# Patient Record
Sex: Female | Born: 1980 | Race: White | Hispanic: No | Marital: Married | Smoking: Never smoker
Health system: Southern US, Community
[De-identification: ages and names within clinical notes are randomized; demographics above are authoritative.]

## PROBLEM LIST (undated history)

## (undated) ENCOUNTER — Inpatient Hospital Stay (HOSPITAL_COMMUNITY): Payer: Self-pay

## (undated) DIAGNOSIS — N979 Female infertility, unspecified: Secondary | ICD-10-CM

## (undated) DIAGNOSIS — E282 Polycystic ovarian syndrome: Secondary | ICD-10-CM

## (undated) DIAGNOSIS — J45998 Other asthma: Secondary | ICD-10-CM

## (undated) DIAGNOSIS — N6320 Unspecified lump in the left breast, unspecified quadrant: Secondary | ICD-10-CM

## (undated) DIAGNOSIS — E119 Type 2 diabetes mellitus without complications: Secondary | ICD-10-CM

## (undated) DIAGNOSIS — M2550 Pain in unspecified joint: Secondary | ICD-10-CM

## (undated) DIAGNOSIS — R0602 Shortness of breath: Secondary | ICD-10-CM

## (undated) DIAGNOSIS — M549 Dorsalgia, unspecified: Secondary | ICD-10-CM

## (undated) DIAGNOSIS — K219 Gastro-esophageal reflux disease without esophagitis: Secondary | ICD-10-CM

## (undated) DIAGNOSIS — Z973 Presence of spectacles and contact lenses: Secondary | ICD-10-CM

## (undated) DIAGNOSIS — Q185 Microstomia: Secondary | ICD-10-CM

## (undated) DIAGNOSIS — D649 Anemia, unspecified: Secondary | ICD-10-CM

## (undated) DIAGNOSIS — E669 Obesity, unspecified: Secondary | ICD-10-CM

## (undated) DIAGNOSIS — J302 Other seasonal allergic rhinitis: Secondary | ICD-10-CM

## (undated) DIAGNOSIS — Z98811 Dental restoration status: Secondary | ICD-10-CM

## (undated) DIAGNOSIS — L732 Hidradenitis suppurativa: Secondary | ICD-10-CM

## (undated) DIAGNOSIS — J189 Pneumonia, unspecified organism: Secondary | ICD-10-CM

## (undated) DIAGNOSIS — G709 Myoneural disorder, unspecified: Secondary | ICD-10-CM

## (undated) DIAGNOSIS — F32A Depression, unspecified: Secondary | ICD-10-CM

## (undated) DIAGNOSIS — T8859XA Other complications of anesthesia, initial encounter: Secondary | ICD-10-CM

## (undated) DIAGNOSIS — R002 Palpitations: Secondary | ICD-10-CM

## (undated) DIAGNOSIS — L309 Dermatitis, unspecified: Secondary | ICD-10-CM

## (undated) DIAGNOSIS — T4145XA Adverse effect of unspecified anesthetic, initial encounter: Secondary | ICD-10-CM

## (undated) DIAGNOSIS — R519 Headache, unspecified: Secondary | ICD-10-CM

## (undated) DIAGNOSIS — F419 Anxiety disorder, unspecified: Secondary | ICD-10-CM

## (undated) DIAGNOSIS — I10 Essential (primary) hypertension: Secondary | ICD-10-CM

## (undated) DIAGNOSIS — F329 Major depressive disorder, single episode, unspecified: Secondary | ICD-10-CM

## (undated) DIAGNOSIS — F909 Attention-deficit hyperactivity disorder, unspecified type: Secondary | ICD-10-CM

## (undated) DIAGNOSIS — M1990 Unspecified osteoarthritis, unspecified site: Secondary | ICD-10-CM

## (undated) DIAGNOSIS — R600 Localized edema: Secondary | ICD-10-CM

## (undated) DIAGNOSIS — N939 Abnormal uterine and vaginal bleeding, unspecified: Secondary | ICD-10-CM

## (undated) HISTORY — DX: Localized edema: R60.0

## (undated) HISTORY — DX: Female infertility, unspecified: N97.9

## (undated) HISTORY — PX: ORIF ANKLE FRACTURE: SHX5408

## (undated) HISTORY — PX: BREAST EXCISIONAL BIOPSY: SUR124

## (undated) HISTORY — DX: Abnormal uterine and vaginal bleeding, unspecified: N93.9

## (undated) HISTORY — DX: Essential (primary) hypertension: I10

## (undated) HISTORY — DX: Pain in unspecified joint: M25.50

## (undated) HISTORY — DX: Polycystic ovarian syndrome: E28.2

## (undated) HISTORY — DX: Attention-deficit hyperactivity disorder, unspecified type: F90.9

## (undated) HISTORY — DX: Hidradenitis suppurativa: L73.2

## (undated) HISTORY — PX: HIGH RISK BREAST EXCISION: SHX6773

## (undated) HISTORY — PX: OVARIAN CYST REMOVAL: SHX89

## (undated) HISTORY — DX: Dorsalgia, unspecified: M54.9

## (undated) HISTORY — DX: Other seasonal allergic rhinitis: J30.2

## (undated) HISTORY — DX: Obesity, unspecified: E66.9

## (undated) HISTORY — DX: Dermatitis, unspecified: L30.9

## (undated) HISTORY — DX: Type 2 diabetes mellitus without complications: E11.9

## (undated) HISTORY — DX: Shortness of breath: R06.02

## (undated) HISTORY — DX: Gastro-esophageal reflux disease without esophagitis: K21.9

## (undated) HISTORY — PX: CHOLECYSTECTOMY: SHX55

## (undated) HISTORY — PX: BREAST BIOPSY: SHX20

## (undated) HISTORY — PX: FLEXIBLE SIGMOIDOSCOPY: SHX1649

## (undated) HISTORY — PX: TONSILLECTOMY AND ADENOIDECTOMY: SHX28

---

## 2008-10-20 HISTORY — PX: CHOLECYSTECTOMY: SHX55

## 2014-04-13 DIAGNOSIS — J45909 Unspecified asthma, uncomplicated: Secondary | ICD-10-CM | POA: Insufficient documentation

## 2014-04-13 DIAGNOSIS — O10911 Unspecified pre-existing hypertension complicating pregnancy, first trimester: Secondary | ICD-10-CM

## 2014-04-13 HISTORY — DX: Unspecified pre-existing hypertension complicating pregnancy, first trimester: O10.911

## 2014-04-13 HISTORY — DX: Unspecified asthma, uncomplicated: J45.909

## 2014-05-04 DIAGNOSIS — Z3490 Encounter for supervision of normal pregnancy, unspecified, unspecified trimester: Secondary | ICD-10-CM | POA: Insufficient documentation

## 2014-05-16 DIAGNOSIS — Z8659 Personal history of other mental and behavioral disorders: Secondary | ICD-10-CM | POA: Insufficient documentation

## 2014-07-11 DIAGNOSIS — R7989 Other specified abnormal findings of blood chemistry: Secondary | ICD-10-CM | POA: Insufficient documentation

## 2014-07-11 HISTORY — DX: Other specified abnormal findings of blood chemistry: R79.89

## 2014-07-24 DIAGNOSIS — M2550 Pain in unspecified joint: Secondary | ICD-10-CM | POA: Insufficient documentation

## 2014-07-24 DIAGNOSIS — I10 Essential (primary) hypertension: Secondary | ICD-10-CM

## 2014-07-24 DIAGNOSIS — E119 Type 2 diabetes mellitus without complications: Secondary | ICD-10-CM | POA: Insufficient documentation

## 2014-07-24 DIAGNOSIS — F419 Anxiety disorder, unspecified: Secondary | ICD-10-CM

## 2014-07-24 HISTORY — DX: Essential (primary) hypertension: I10

## 2014-07-24 HISTORY — DX: Type 2 diabetes mellitus without complications: E11.9

## 2014-07-24 HISTORY — DX: Anxiety disorder, unspecified: F41.9

## 2015-05-25 ENCOUNTER — Other Ambulatory Visit: Payer: Self-pay

## 2015-05-25 DIAGNOSIS — E1140 Type 2 diabetes mellitus with diabetic neuropathy, unspecified: Secondary | ICD-10-CM

## 2015-05-25 DIAGNOSIS — K2100 Gastro-esophageal reflux disease with esophagitis, without bleeding: Secondary | ICD-10-CM

## 2015-05-25 DIAGNOSIS — Z01818 Encounter for other preprocedural examination: Secondary | ICD-10-CM

## 2015-05-25 DIAGNOSIS — J4541 Moderate persistent asthma with (acute) exacerbation: Secondary | ICD-10-CM

## 2015-06-22 ENCOUNTER — Ambulatory Visit (HOSPITAL_COMMUNITY)
Admission: RE | Admit: 2015-06-22 | Discharge: 2015-06-22 | Disposition: A | Discharge status: Home / Self Care | Payer: BC Managed Care – PPO | Source: Ambulatory Visit | Attending: Surgery | Admitting: Surgery

## 2015-06-22 ENCOUNTER — Other Ambulatory Visit: Payer: Self-pay

## 2015-06-22 DIAGNOSIS — E119 Type 2 diabetes mellitus without complications: Secondary | ICD-10-CM | POA: Insufficient documentation

## 2015-06-22 DIAGNOSIS — J45909 Unspecified asthma, uncomplicated: Secondary | ICD-10-CM | POA: Diagnosis not present

## 2015-06-22 DIAGNOSIS — K219 Gastro-esophageal reflux disease without esophagitis: Secondary | ICD-10-CM | POA: Insufficient documentation

## 2015-06-22 DIAGNOSIS — K449 Diaphragmatic hernia without obstruction or gangrene: Secondary | ICD-10-CM | POA: Insufficient documentation

## 2015-06-22 DIAGNOSIS — Z6841 Body Mass Index (BMI) 40.0 and over, adult: Secondary | ICD-10-CM | POA: Insufficient documentation

## 2015-06-22 DIAGNOSIS — I10 Essential (primary) hypertension: Secondary | ICD-10-CM | POA: Diagnosis not present

## 2015-06-22 DIAGNOSIS — Z9049 Acquired absence of other specified parts of digestive tract: Secondary | ICD-10-CM | POA: Diagnosis not present

## 2015-06-30 ENCOUNTER — Encounter: Payer: Self-pay | Admitting: Dietician

## 2015-06-30 ENCOUNTER — Encounter: Payer: BC Managed Care – PPO | Attending: Surgery | Admitting: Dietician

## 2015-06-30 DIAGNOSIS — Z6841 Body Mass Index (BMI) 40.0 and over, adult: Secondary | ICD-10-CM | POA: Diagnosis not present

## 2015-06-30 DIAGNOSIS — Z713 Dietary counseling and surveillance: Secondary | ICD-10-CM | POA: Diagnosis not present

## 2015-06-30 NOTE — Progress Notes (Signed)
  Pre-Op Assessment Visit:  Pre-Operative RYGB Surgery  Medical Nutrition Therapy:  Appt start time: 8676   End time:  1950.  Patient was seen on 06/30/2015 for Pre-Operative Nutrition Assessment. Assessment and letter of approval faxed to [facility] Surgery Bariatric Surgery Program coordinator on 06/30/2015.   Preferred Learning Style:   No preference indicated   Learning Readiness:   Ready  Handouts given during visit include:  Pre-Op Goals Bariatric Surgery Protein Shakes   During the appointment today the following Pre-Op Goals were reviewed with the patient: Maintain or lose weight as instructed by your surgeon Make healthy food choices Begin to limit portion sizes Limited concentrated sugars and fried foods Keep fat/sugar in the single digits per serving on   food labels Practice CHEWING your food  (aim for 30 chews per bite or until applesauce consistency) Practice not drinking 15 minutes before, during, and 30 minutes after each meal/snack Avoid all carbonated beverages  Avoid/limit caffeinated beverages  Avoid all sugar-sweetened beverages Consume 3 meals per day; eat every 3-5 hours Make a list of non-food related activities Aim for 64-100 ounces of FLUID daily  Aim for at least 60-80 grams of PROTEIN daily Look for a liquid protein source that contain ?15 g protein and ?5 g carbohydrate  (ex: shakes, drinks, shots)  Patient-Centered Goals: Goals: be more active, improve diabetes, having more energy, improve sleep, prevent other health problems, come off blood pressure medications, be able to play with and be a good example for her son  8.5 confidence/10 importance scale   Demonstrated degree of understanding via:  Teach Back  Teaching Method Utilized:  Visual Auditory Hands on  Barriers to learning/adherence to lifestyle change: none  Patient to call the [facility] to enroll in Pre-Op and Post-Op Nutrition Education when  surgery date is scheduled.

## 2015-06-30 NOTE — Patient Instructions (Signed)

## 2015-07-02 ENCOUNTER — Ambulatory Visit: Payer: Self-pay | Admitting: Dietician

## 2015-07-17 ENCOUNTER — Encounter (HOSPITAL_COMMUNITY): Payer: Self-pay

## 2015-07-17 ENCOUNTER — Encounter (HOSPITAL_COMMUNITY)
Admission: RE | Admit: 2015-07-17 | Discharge: 2015-07-17 | Disposition: A | Discharge status: Home / Self Care | Payer: BC Managed Care – PPO | Source: Ambulatory Visit | Attending: Obstetrics and Gynecology | Admitting: Obstetrics and Gynecology

## 2015-07-17 DIAGNOSIS — F419 Anxiety disorder, unspecified: Secondary | ICD-10-CM | POA: Diagnosis not present

## 2015-07-17 DIAGNOSIS — K219 Gastro-esophageal reflux disease without esophagitis: Secondary | ICD-10-CM | POA: Diagnosis not present

## 2015-07-17 DIAGNOSIS — N711 Chronic inflammatory disease of uterus: Secondary | ICD-10-CM | POA: Diagnosis not present

## 2015-07-17 DIAGNOSIS — Z9049 Acquired absence of other specified parts of digestive tract: Secondary | ICD-10-CM | POA: Diagnosis not present

## 2015-07-17 DIAGNOSIS — N926 Irregular menstruation, unspecified: Secondary | ICD-10-CM | POA: Diagnosis not present

## 2015-07-17 DIAGNOSIS — I10 Essential (primary) hypertension: Secondary | ICD-10-CM | POA: Diagnosis not present

## 2015-07-17 DIAGNOSIS — F329 Major depressive disorder, single episode, unspecified: Secondary | ICD-10-CM | POA: Diagnosis not present

## 2015-07-17 DIAGNOSIS — E119 Type 2 diabetes mellitus without complications: Secondary | ICD-10-CM | POA: Diagnosis not present

## 2015-07-17 DIAGNOSIS — Z885 Allergy status to narcotic agent status: Secondary | ICD-10-CM | POA: Diagnosis not present

## 2015-07-17 DIAGNOSIS — J45909 Unspecified asthma, uncomplicated: Secondary | ICD-10-CM | POA: Diagnosis not present

## 2015-07-17 HISTORY — DX: Depression, unspecified: F32.A

## 2015-07-17 HISTORY — DX: Anxiety disorder, unspecified: F41.9

## 2015-07-17 HISTORY — DX: Major depressive disorder, single episode, unspecified: F32.9

## 2015-07-17 LAB — CBC
HCT: 37.9 % (ref 36.0–46.0)
HEMOGLOBIN: 12.4 g/dL (ref 12.0–15.0)
MCH: 27.4 pg (ref 26.0–34.0)
MCHC: 32.7 g/dL (ref 30.0–36.0)
MCV: 83.7 fL (ref 78.0–100.0)
Platelets: 434 10*3/uL — ABNORMAL HIGH (ref 150–400)
RBC: 4.53 MIL/uL (ref 3.87–5.11)
RDW: 15.3 % (ref 11.5–15.5)
WBC: 12.8 10*3/uL — ABNORMAL HIGH (ref 4.0–10.5)

## 2015-07-17 LAB — BASIC METABOLIC PANEL
ANION GAP: 10 (ref 5–15)
BUN: 12 mg/dL (ref 6–20)
CALCIUM: 9.7 mg/dL (ref 8.9–10.3)
CHLORIDE: 96 mmol/L — AB (ref 101–111)
CO2: 29 mmol/L (ref 22–32)
Creatinine, Ser: 0.73 mg/dL (ref 0.44–1.00)
GFR calc non Af Amer: >60 mL/min (ref >60)
GLUCOSE: 194 mg/dL — AB (ref 65–99)
Potassium: 3.4 mmol/L — ABNORMAL LOW (ref 3.5–5.1)
Sodium: 135 mmol/L (ref 135–145)

## 2015-07-17 MED ORDER — DEXTROSE 5 % IV SOLN
3.0000 g | INTRAVENOUS | Status: AC
  Administered 2015-07-18: 3 g via INTRAVENOUS
  Filled 2015-07-17: qty 3000

## 2015-07-17 NOTE — Patient Instructions (Signed)
Your procedure is scheduled on:07/18/15  Enter through the Main Entrance at :64 am Pick up desk phone and dial [phone] and inform us of your arrival.  Please call [phone] if you have any problems the morning of surgery.  Remember: Do not eat food after midnight:tonight Clear liquids are ok until: 8am on WED   You may brush your teeth the morning of surgery.  Take these meds the morning of surgery with a sip of water:Cymbalta, Allegra, and HCTZ  DO NOT wear jewelry, eye make-up, lipstick,body lotion, or dark fingernail polish.  (Polished toes are ok) You may wear deodorant.  If you are to be admitted after surgery, leave suitcase in car until your room has been assigned. Patients discharged on the day of surgery will not be allowed to drive home. Wear loose fitting, comfortable clothes for your ride home.

## 2015-07-17 NOTE — H&P (Signed)
Mindy Reed is an 34 y.o. female with irregular menses. U/S in office showed uterus measuring 7.8 x 3.8 x 5.4 cm with a 4.9 mm EM stripe. Repeated attempts at cannulation of cervix for [facility] and insertion of Mirena were unsuccessful. She has bariatric surgery scheduled.  Pertinent Gynecological History: Menses: irregular occurring approximately every 21 days with spotting approximately several days per month Bleeding: dysfunctional uterine bleeding Contraception: none DES exposure: denies Blood transfusions: none Sexually transmitted diseases: no past history Previous GYN Procedures: ovarian cystectomy  Last mammogram: none Date: N/S Last pap: normal Date: 2016 OB History: G1, P1   Menstrual History: Menarche age: unknown  Patient's last menstrual period was 06/07/2015 (exact date).    Past Medical History  Diagnosis Date  . Hypertension   . Asthma     seasonal  . Diabetes mellitus without complication     Type 2   . Depression   . Anxiety   . GERD (gastroesophageal reflux disease)     no meds currently    Past Surgical History  Procedure Laterality Date  . Cholecystectomy    . Tonsillectomy    . Ovarian cyst removal    . Ankle surgery      No family history on file.  Social History:  reports that she has never smoked. She does not have any smokeless tobacco history on file. She reports that she does not drink alcohol or use illicit drugs.  Allergies:  Allergies  Allergen Reactions  . Morphine And Related Hives    No prescriptions prior to admission    Review of Systems  Constitutional: Negative for fever.    Last menstrual period 06/07/2015. Physical Exam  Cardiovascular: Normal rate and regular rhythm.   Respiratory: Effort normal and breath sounds normal.  GI: Soft.  Morbidly obese    Results for orders placed or performed during the hospital encounter of 07/17/15 (from the past 24 hour(s))  Basic metabolic panel     Status: Abnormal   Collection  Time: 07/17/15  3:15 PM  Result Value Ref Range   Sodium 135 135 - 145 mmol/L   Potassium 3.4 (L) 3.5 - 5.1 mmol/L   Chloride 96 (L) 101 - 111 mmol/L   CO2 29 22 - 32 mmol/L   Glucose, Bld 194 (H) 65 - 99 mg/dL   BUN 12 6 - 20 mg/dL   Creatinine, Ser 0.73 0.44 - 1.00 mg/dL   Calcium 9.7 8.9 - 10.3 mg/dL   GFR calc non Af Amer >60 >60 mL/min   GFR calc Af Amer >60 >60 mL/min   Anion gap 10 5 - 15  CBC     Status: Abnormal   Collection Time: 07/17/15  3:15 PM  Result Value Ref Range   WBC 12.8 (H) 4.0 - 10.5 K/uL   RBC 4.53 3.87 - 5.11 MIL/uL   Hemoglobin 12.4 12.0 - 15.0 g/dL   HCT 37.9 36.0 - 46.0 %   MCV 83.7 78.0 - 100.0 fL   MCH 27.4 26.0 - 34.0 pg   MCHC 32.7 30.0 - 36.0 g/dL   RDW 15.3 11.5 - 15.5 %   Platelets 434 (H) 150 - 400 K/uL    No results found.  Assessment/Plan: 34 yo G1P1 with irregular menses H/S, D&C with Mirena IUD placement Risks reviewed with patient including infection, uterine perforation and organ damage, bleeding/transfusion-HIV/Hep, DVT/PE, pneumonia.  TOMBLIN II,JAMES E 07/17/2015, 8:04 PM

## 2015-07-18 ENCOUNTER — Encounter (HOSPITAL_COMMUNITY): Payer: Self-pay | Admitting: *Deleted

## 2015-07-18 ENCOUNTER — Ambulatory Visit (HOSPITAL_COMMUNITY)
Admission: RE | Admit: 2015-07-18 | Discharge: 2015-07-18 | Disposition: A | Discharge status: Home / Self Care | Payer: BC Managed Care – PPO | Source: Ambulatory Visit | Attending: Obstetrics and Gynecology | Admitting: Obstetrics and Gynecology

## 2015-07-18 ENCOUNTER — Encounter (HOSPITAL_COMMUNITY)
Admission: RE | Disposition: A | Discharge status: Home / Self Care | Payer: Self-pay | Source: Ambulatory Visit | Attending: Obstetrics and Gynecology

## 2015-07-18 ENCOUNTER — Ambulatory Visit (HOSPITAL_COMMUNITY): Payer: BC Managed Care – PPO | Admitting: Anesthesiology

## 2015-07-18 DIAGNOSIS — J45909 Unspecified asthma, uncomplicated: Secondary | ICD-10-CM | POA: Insufficient documentation

## 2015-07-18 DIAGNOSIS — E119 Type 2 diabetes mellitus without complications: Secondary | ICD-10-CM | POA: Insufficient documentation

## 2015-07-18 DIAGNOSIS — K219 Gastro-esophageal reflux disease without esophagitis: Secondary | ICD-10-CM | POA: Insufficient documentation

## 2015-07-18 DIAGNOSIS — F329 Major depressive disorder, single episode, unspecified: Secondary | ICD-10-CM | POA: Insufficient documentation

## 2015-07-18 DIAGNOSIS — F419 Anxiety disorder, unspecified: Secondary | ICD-10-CM | POA: Insufficient documentation

## 2015-07-18 DIAGNOSIS — I10 Essential (primary) hypertension: Secondary | ICD-10-CM | POA: Insufficient documentation

## 2015-07-18 DIAGNOSIS — Z885 Allergy status to narcotic agent status: Secondary | ICD-10-CM | POA: Insufficient documentation

## 2015-07-18 DIAGNOSIS — N926 Irregular menstruation, unspecified: Secondary | ICD-10-CM | POA: Diagnosis not present

## 2015-07-18 DIAGNOSIS — N711 Chronic inflammatory disease of uterus: Secondary | ICD-10-CM | POA: Insufficient documentation

## 2015-07-18 DIAGNOSIS — Z9049 Acquired absence of other specified parts of digestive tract: Secondary | ICD-10-CM | POA: Insufficient documentation

## 2015-07-18 HISTORY — PX: HYSTEROSCOPY WITH D & C: SHX1775

## 2015-07-18 HISTORY — PX: INTRAUTERINE DEVICE (IUD) INSERTION: SHX5877

## 2015-07-18 LAB — COMPREHENSIVE METABOLIC PANEL
ALBUMIN: 3.9 g/dL (ref 3.5–5.0)
ALT: 25 U/L (ref 14–54)
ANION GAP: 13 (ref 5–15)
AST: 32 U/L (ref 15–41)
Alkaline Phosphatase: 60 U/L (ref 38–126)
BILIRUBIN TOTAL: 0.6 mg/dL (ref 0.3–1.2)
BUN: 11 mg/dL (ref 6–20)
CHLORIDE: 99 mmol/L — AB (ref 101–111)
CO2: 25 mmol/L (ref 22–32)
Calcium: 8.9 mg/dL (ref 8.9–10.3)
Creatinine, Ser: 0.68 mg/dL (ref 0.44–1.00)
GFR calc Af Amer: >60 mL/min (ref >60)
GFR calc non Af Amer: >60 mL/min (ref >60)
GLUCOSE: 162 mg/dL — AB (ref 65–99)
POTASSIUM: 3.3 mmol/L — AB (ref 3.5–5.1)
Sodium: 137 mmol/L (ref 135–145)
TOTAL PROTEIN: 8.1 g/dL (ref 6.5–8.1)

## 2015-07-18 LAB — GLUCOSE, CAPILLARY
GLUCOSE-CAPILLARY: 137 mg/dL — AB (ref 65–99)
Glucose-Capillary: 155 mg/dL — ABNORMAL HIGH (ref 65–99)

## 2015-07-18 LAB — HCG, SERUM, QUALITATIVE: PREG SERUM: NEGATIVE

## 2015-07-18 SURGERY — DILATATION AND CURETTAGE /HYSTEROSCOPY
Anesthesia: General | Site: Vagina

## 2015-07-18 MED ORDER — SUCCINYLCHOLINE CHLORIDE 20 MG/ML IJ SOLN
INTRAMUSCULAR | Status: AC
  Filled 2015-07-18: qty 1

## 2015-07-18 MED ORDER — DEXAMETHASONE SODIUM PHOSPHATE 4 MG/ML IJ SOLN
INTRAMUSCULAR | Status: AC
  Filled 2015-07-18: qty 1

## 2015-07-18 MED ORDER — GLYCOPYRROLATE 0.2 MG/ML IJ SOLN
INTRAMUSCULAR | Status: AC
  Filled 2015-07-18: qty 3

## 2015-07-18 MED ORDER — LIDOCAINE HCL 1 % IJ SOLN
INTRAMUSCULAR | Status: AC
  Filled 2015-07-18: qty 20

## 2015-07-18 MED ORDER — NEOSTIGMINE METHYLSULFATE 10 MG/10ML IV SOLN
INTRAVENOUS | Status: AC
  Filled 2015-07-18: qty 1

## 2015-07-18 MED ORDER — FENTANYL CITRATE (PF) 250 MCG/5ML IJ SOLN
INTRAMUSCULAR | Status: AC
  Filled 2015-07-18: qty 25

## 2015-07-18 MED ORDER — LIDOCAINE HCL (PF) 1 % IJ SOLN
INTRAMUSCULAR | Status: AC
  Filled 2015-07-18: qty 5

## 2015-07-18 MED ORDER — SCOPOLAMINE 1 MG/3DAYS TD PT72
MEDICATED_PATCH | TRANSDERMAL | Status: AC
  Administered 2015-07-18: 1.5 mg via TRANSDERMAL
  Filled 2015-07-18: qty 1

## 2015-07-18 MED ORDER — PROPOFOL 10 MG/ML IV BOLUS
INTRAVENOUS | Status: DC | PRN
Start: 2015-07-18 — End: 2015-07-18
  Administered 2015-07-18: 300 mg via INTRAVENOUS

## 2015-07-18 MED ORDER — PROPOFOL 10 MG/ML IV BOLUS
INTRAVENOUS | Status: AC
  Filled 2015-07-18: qty 20

## 2015-07-18 MED ORDER — ONDANSETRON HCL 4 MG/2ML IJ SOLN
INTRAMUSCULAR | Status: DC | PRN
  Administered 2015-07-18: 4 mg via INTRAVENOUS

## 2015-07-18 MED ORDER — LIDOCAINE HCL (CARDIAC) 20 MG/ML IV SOLN
INTRAVENOUS | Status: DC | PRN
  Administered 2015-07-18: 50 mg via INTRAVENOUS

## 2015-07-18 MED ORDER — FENTANYL CITRATE (PF) 100 MCG/2ML IJ SOLN: 25.0000 ug | INTRAMUSCULAR | Status: DC | PRN

## 2015-07-18 MED ORDER — MIDAZOLAM HCL 2 MG/2ML IJ SOLN
INTRAMUSCULAR | Status: AC
  Filled 2015-07-18: qty 4

## 2015-07-18 MED ORDER — ONDANSETRON HCL 4 MG/2ML IJ SOLN
INTRAMUSCULAR | Status: AC
  Filled 2015-07-18: qty 2

## 2015-07-18 MED ORDER — DEXAMETHASONE SODIUM PHOSPHATE 10 MG/ML IJ SOLN
INTRAMUSCULAR | Status: DC | PRN
  Administered 2015-07-18: 4 mg via INTRAVENOUS

## 2015-07-18 MED ORDER — FENTANYL CITRATE (PF) 100 MCG/2ML IJ SOLN
INTRAMUSCULAR | Status: DC | PRN
  Administered 2015-07-18: 50 ug via INTRAVENOUS
  Administered 2015-07-18: 25 ug via INTRAVENOUS
  Administered 2015-07-18: 50 ug via INTRAVENOUS
  Administered 2015-07-18: 25 ug via INTRAVENOUS

## 2015-07-18 MED ORDER — LACTATED RINGERS IV SOLN
INTRAVENOUS | Status: DC
  Administered 2015-07-18: 12:00:00 via INTRAVENOUS

## 2015-07-18 MED ORDER — SUCCINYLCHOLINE CHLORIDE 20 MG/ML IJ SOLN
INTRAMUSCULAR | Status: DC | PRN
  Administered 2015-07-18: 120 mg via INTRAVENOUS

## 2015-07-18 MED ORDER — KETOROLAC TROMETHAMINE 30 MG/ML IJ SOLN
INTRAMUSCULAR | Status: AC
  Filled 2015-07-18: qty 1

## 2015-07-18 MED ORDER — MIDAZOLAM HCL 2 MG/2ML IJ SOLN
INTRAMUSCULAR | Status: DC | PRN
  Administered 2015-07-18: 2 mg via INTRAVENOUS

## 2015-07-18 MED ORDER — SCOPOLAMINE 1 MG/3DAYS TD PT72
1.0000 | MEDICATED_PATCH | Freq: Once | TRANSDERMAL | Status: DC
  Administered 2015-07-18: 1.5 mg via TRANSDERMAL

## 2015-07-18 MED ORDER — KETOROLAC TROMETHAMINE 30 MG/ML IJ SOLN
INTRAMUSCULAR | Status: DC | PRN
  Administered 2015-07-18: 30 mg via INTRAVENOUS

## 2015-07-18 MED ORDER — ONDANSETRON HCL 4 MG/2ML IJ SOLN: 4.0000 mg | Freq: Once | INTRAMUSCULAR | Status: DC | PRN

## 2015-07-18 MED ORDER — GLYCINE 1.5 % IR SOLN
Status: DC | PRN
  Administered 2015-07-18: 3000 mL

## 2015-07-18 SURGICAL SUPPLY — 17 items
CANISTER SUCT 3000ML (MISCELLANEOUS) ×2 IMPLANT
CATH ROBINSON RED A/P 16FR (CATHETERS) ×2 IMPLANT
CLOTH BEACON ORANGE TIMEOUT ST (SAFETY) ×2 IMPLANT
CONTAINER PREFILL 10% NBF 60ML (FORM) ×4 IMPLANT
ELECT REM PT RETURN 9FT ADLT (ELECTROSURGICAL)
ELECTRODE REM PT RTRN 9FT ADLT (ELECTROSURGICAL) IMPLANT
GLOVE BIO SURGEON STRL SZ8 (GLOVE) ×4 IMPLANT
GLOVE BIOGEL PI IND STRL 8 (GLOVE) ×1 IMPLANT
GLOVE BIOGEL PI INDICATOR 8 (GLOVE) ×1
GOWN STRL REUS W/TWL LRG LVL3 (GOWN DISPOSABLE) ×4 IMPLANT
LOOP ANGLED CUTTING 22FR (CUTTING LOOP) IMPLANT
PACK VAGINAL MINOR WOMEN LF (CUSTOM PROCEDURE TRAY) ×2 IMPLANT
PAD OB MATERNITY 4.3X12.25 (PERSONAL CARE ITEMS) ×2 IMPLANT
TOWEL OR 17X24 6PK STRL BLUE (TOWEL DISPOSABLE) ×4 IMPLANT
TUBING AQUILEX INFLOW (TUBING) ×2 IMPLANT
TUBING AQUILEX OUTFLOW (TUBING) ×2 IMPLANT
WATER STERILE IRR 1000ML POUR (IV SOLUTION) ×2 IMPLANT

## 2015-07-18 NOTE — Op Note (Deleted)
[name], [name] NO.:  [id number]  MEDICAL RECORD NO.:  [id number]  LOCATION:  [place]                          FACILITY:  [facility]  PHYSICIAN:  Daleen Bo. Gaetano Net, M.D. DATE OF BIRTH:  [birth year]  DATE OF PROCEDURE:  07/18/2015 DATE OF DISCHARGE:                              OPERATIVE REPORT   PREOPERATIVE DIAGNOSIS:  Irregular menses.  POSTOPERATIVE DIAGNOSIS:  Irregular menses.  PROCEDURE:  Hysteroscopy, dilation and curettage, and insertion of Mirena IUD.  SURGEON:  Daleen Bo. Gaetano Net, M.D.  ANESTHESIA:  General endotracheal intubation; Lauretta Grill, M.D.  ESTIMATED BLOOD LOSS:  Less than 100 mL.  SPECIMENS:  Endometrial curettings to Pathology.  I'S AND O'S:  Distending medium 90 mL deficit.  INDICATIONS AND CONSENT:  This patient is a 34 year old patient with irregular menses.  She is morbidly obese and is scheduled for bariatric surgery in the next month or so.  After discussion, sonohysterogram, possible endometrial biopsy, and insertion of Mirena IUD for contraception, and endometrial protection was discussed in the office. However, I could not adequately visualize her cervix for a sonohysterogram or endometrial biopsy or IUD insertion in the office. Therefore, she was taken to the operating room.  Potential risks and complications have been discussed preoperatively, including but not limited to infection, uterine perforation, organ damage, bleeding requiring transfusion of blood products with HIV and hepatitis acquisition, DVT, PE, pneumonia, laparotomy.  IUD is also being discussed with failure rate, uterine perforation, cramping, bleeding, increased ectopic risk.  All questions were answered.  Consent was signed on the chart.  FINDINGS:  Both fallopian tube and ostia were identified.  No abnormal structures were noted in the uterine cavity.  DESCRIPTION OF PROCEDURE:  The patient was taken to the operating room where she was placed in a dorsal  supine position, and general anesthesia was induced via endotracheal intubation.  She was placed in a dorsal lithotomy position.  A time-out was undertaken.  She was prepped, bladder was straight-catheterized and draped in a sterile fashion.  A bivalve speculum was placed in the vagina.  Anterior cervical lip was grasped with a single-tooth tenaculum.  Uterus sounded to 8 cm.  The small hit diagnostic hysteroscope was placed.  The endocervical canal advanced under direct visualization without difficulty.  Distending media was used.  The above findings were noted.  Hysteroscope was withdrawn, and a sharp curettage was carried out for a small amount of tissue.  The Mirena IUD was then placed per manufacturer's instructions without difficulty. Strings were trimmed.  Instruments were removed.  Good hemostasis was noted.  All counts were correct, and the patient was awakened, taken to recovery room in stable condition.     Daleen Bo Gaetano Net, M.D.     JET/MEDQ  D:  07/18/2015  T:  07/18/2015  Job:  [id number]

## 2015-07-18 NOTE — Progress Notes (Signed)
No changes to H&P per patient history Reviewed with patient procedure-H/S, D&C, insertion of Mirena IUD All questions answered Patient states she understands and agrees

## 2015-07-18 NOTE — Op Note (Signed)
[name], Mindy NO.:  [id number]  MEDICAL RECORD NO.:  [id number]  LOCATION:  [facility]                          FACILITY:  [facility]  PHYSICIAN:  Daleen Bo. Gaetano Net, M.D. DATE OF BIRTH:  1980/11/02  DATE OF PROCEDURE:  07/18/2015 DATE OF DISCHARGE:  07/18/2015                              OPERATIVE REPORT   PREOPERATIVE DIAGNOSIS:  Irregular menses.  POSTOPERATIVE DIAGNOSIS:  Irregular menses.  PROCEDURE:  Hysteroscopy, dilation and curettage, and insertion of Mirena IUD.  SURGEON:  Daleen Bo. Gaetano Net, M.D.  ANESTHESIA:  General endotracheal intubation; Lauretta Grill, M.D.  ESTIMATED BLOOD LOSS:  Less than 100 mL.  SPECIMENS:  Endometrial curettings to Pathology.  I'S AND O'S:  Distending medium 90 mL deficit.  INDICATIONS AND CONSENT:  This patient is a 34 year old patient with irregular menses.  She is morbidly obese and is scheduled for bariatric surgery in the next month or so.  After discussion, sonohysterogram, possible endometrial biopsy, and insertion of Mirena IUD for contraception, and endometrial protection was discussed in the office. However, I could not adequately visualize her cervix for a sonohysterogram or endometrial biopsy or IUD insertion in the office. Therefore, she was taken to the operating room.  Potential risks and complications have been discussed preoperatively, including but not limited to infection, uterine perforation, organ damage, bleeding requiring transfusion of blood products with HIV and hepatitis acquisition, DVT, PE, pneumonia, laparotomy.  IUD is also being discussed with failure rate, uterine perforation, cramping, bleeding, increased ectopic risk.  All questions were answered.  Consent was signed on the chart.  FINDINGS:  Both fallopian tube and ostia were identified.  No abnormal structures were noted in the uterine cavity.  DESCRIPTION OF PROCEDURE:  The patient was taken to the operating room where she was placed in  a dorsal supine position, and general anesthesia was induced via endotracheal intubation.  She was placed in a dorsal lithotomy position.  A time-out was undertaken.  She was prepped, bladder was straight-catheterized and draped in a sterile fashion.  A bivalve speculum was placed in the vagina.  Anterior cervical lip was grasped with a single-tooth tenaculum.  Uterus sounded to 8 cm.  The small hit diagnostic hysteroscope was placed.  The endocervical canal advanced under direct visualization without difficulty.  Distending media was used.  The above findings were noted.  Hysteroscope was withdrawn, and a sharp curettage was carried out for a small amount of tissue.  The Mirena IUD was then placed per manufacturer's instructions without difficulty. Strings were trimmed.  Instruments were removed.  Good hemostasis was noted.  All counts were correct, and the patient was awakened, taken to recovery room in stable condition.     Daleen Bo Gaetano Net, M.D.     JET/MEDQ  D:  07/18/2015  T:  07/18/2015  Job:  [id number]

## 2015-07-18 NOTE — Brief Op Note (Signed)
07/18/2015  1:30 PM  PATIENT:  Mindy Reed  34 y.o. female  PRE-OPERATIVE DIAGNOSIS:  menorrhagia  POST-OPERATIVE DIAGNOSIS:  menorrhagia  PROCEDURE:  Procedure(s): DILATATION AND CURETTAGE /HYSTEROSCOPY (N/A) INTRAUTERINE DEVICE (IUD) INSERTION (N/A)  SURGEON:  Surgeon(s) and Role:    * Everlene Farrier, MD - Primary  PHYSICIAN ASSISTANT:   ASSISTANTS: none   ANESTHESIA:   general  EBL:     BLOOD ADMINISTERED:none  DRAINS: none   LOCAL MEDICATIONS USED:  NONE  SPECIMEN:  Source of Specimen:  endometrial currettings  DISPOSITION OF SPECIMEN:  PATHOLOGY  COUNTS:  YES  TOURNIQUET:  * No tourniquets in log *  DICTATION: .Other Dictation: Dictation Number [id number]  PLAN OF CARE: Discharge to home after PACU  PATIENT DISPOSITION:  PACU - hemodynamically stable.   Delay start of Pharmacological VTE agent (>24hrs) due to surgical blood loss or risk of bleeding: not applicable

## 2015-07-18 NOTE — Transfer of Care (Signed)
Immediate Anesthesia Transfer of Care Note  Patient: Mindy Reed  Procedure(s) Performed: Procedure(s): DILATATION AND CURETTAGE /HYSTEROSCOPY (N/A) INTRAUTERINE DEVICE (IUD) INSERTION (N/A)  Patient Location: PACU  Anesthesia Type:General  Level of Consciousness: awake and alert   Airway & Oxygen Therapy: Patient Spontanous Breathing and Patient connected to nasal cannula oxygen  Post-op Assessment: Report given to RN and Post -op Vital signs reviewed and stable  Post vital signs: Reviewed and stable  Last Vitals:  Filed Vitals:   07/18/15 1119  BP: 148/79  Pulse: 87  Temp: 36.6 C  Resp: 18    Complications: No apparent anesthesia complications

## 2015-07-18 NOTE — Progress Notes (Signed)
  Pre-Operative Nutrition Class:  Appt start time: 2091   End time:  1830.  Patient was seen on 07/16/15 for Pre-Operative Bariatric Surgery Education at [facility].   Surgery date: 08/27/15 Surgery type: RYGB Start weight at [facility]: 444 lbs on 06/30/15 Weight today: 446.2 lbs  TANITA  BODY COMP RESULTS  07/16/15   BMI (kg/m^2) N/A   Fat Mass (lbs)    Fat Free Mass (lbs)    Total Body Water (lbs)      The following the learning objectives were met by the patient during this course:  Identify Pre-Op Dietary Goals and will begin 2 weeks pre-operatively  Identify appropriate sources of fluids and proteins   State protein recommendations and appropriate sources pre and post-operatively  Identify Post-Operative Dietary Goals and will follow for 2 weeks post-operatively  Identify appropriate multivitamin and calcium sources  Describe the need for physical activity post-operatively and will follow MD recommendations  State when to call healthcare provider regarding medication questions or post-operative complications  Handouts given during class include:  Pre-Op Bariatric Surgery Diet Handout  Protein Shake Handout  Post-Op Bariatric Surgery Nutrition Handout  [facility] Program Information Flyer  Support Group Information Flyer  [facility] Bariatric Supplements Price List  Follow-Up Plan: Patient will follow-up at [facility] 2 weeks post operatively for diet advancement per MD.

## 2015-07-18 NOTE — Anesthesia Procedure Notes (Signed)
Procedure Name: Intubation Date/Time: 07/18/2015 1:02 PM Performed by: Bufford Spikes Pre-anesthesia Checklist: Patient identified, Patient being monitored, Emergency Drugs available, Timeout performed and Suction available Patient Re-evaluated:Patient Re-evaluated prior to inductionOxygen Delivery Method: Circle system utilized Preoxygenation: Pre-oxygenation with 100% oxygen (3 full minutes) Intubation Type: IV induction Ventilation: Mask ventilation without difficulty Laryngoscope Size: Glidescope and 3 Grade View: Grade I Tube type: Oral Tube size: 7.0 mm Number of attempts: 1 Airway Equipment and Method: Patient positioned with wedge pillow,  Stylet and Video-laryngoscopy Placement Confirmation: breath sounds checked- equal and bilateral and positive ETCO2 Secured at: 22 cm Tube secured with: Tape Dental Injury: Teeth and Oropharynx as per pre-operative assessment  Difficulty Due To: Difficulty was anticipated Future Recommendations: Recommend- induction with short-acting agent, and alternative techniques readily available Comments: EZ intubation with Grade 1 view with glide scope on wedge. Good mask airway throughout. Nasal trumpet inserted via L nare after intubation.

## 2015-07-18 NOTE — Anesthesia Preprocedure Evaluation (Addendum)
Anesthesia Evaluation  Patient identified by MRN, date of birth, ID band Patient awake    Reviewed: Allergy & Precautions, NPO status , Patient's Chart, lab work & pertinent test results  History of Anesthesia Complications Negative for: history of anesthetic complications  Airway Mallampati: III  TM Distance: >3 FB Neck ROM: Full    Dental no notable dental hx. (+) Dental Advisory Given   Pulmonary asthma ,    Pulmonary exam normal breath sounds clear to auscultation       Cardiovascular hypertension, Pt. on medications Normal cardiovascular exam Rhythm:Regular Rate:Normal     Neuro/Psych PSYCHIATRIC DISORDERS Anxiety Depression negative neurological ROS     GI/Hepatic Neg liver ROS, GERD  ,  Endo/Other  diabetesMorbid obesityBMI 70  Renal/GU negative Renal ROS  negative genitourinary   Musculoskeletal negative musculoskeletal ROS (+)   Abdominal (+) + obese,   Peds negative pediatric ROS (+)  Hematology negative hematology ROS (+)   Anesthesia Other Findings   Reproductive/Obstetrics negative OB ROS                            Anesthesia Physical Anesthesia Plan  ASA: III  Anesthesia Plan: General   Post-op Pain Management:    Induction: Intravenous  Airway Management Planned: Oral ETT  Additional Equipment:   Intra-op Plan:   Post-operative Plan: Extubation in OR  Informed Consent: I have reviewed the patients History and Physical, chart, labs and discussed the procedure including the risks, benefits and alternatives for the proposed anesthesia with the patient or authorized representative who has indicated his/her understanding and acceptance.   Dental advisory given  Plan Discussed with:   Anesthesia Plan Comments:         Anesthesia Quick Evaluation

## 2015-07-18 NOTE — Discharge Instructions (Signed)
°  Post Anesthesia Home Care Instructions ° °Activity: °Get plenty of rest for the remainder of the day. A responsible adult should stay with you for 24 hours following the procedure.  °For the next 24 hours, DO NOT: °-Drive a car °-Operate machinery °-Drink alcoholic beverages °-Take any medication unless instructed by your physician °-Make any legal decisions or sign important papers. ° °Meals: °Start with liquid foods such as gelatin or soup. Progress to regular foods as tolerated. Avoid greasy, spicy, heavy foods. If nausea and/or vomiting occur, drink only clear liquids until the nausea and/or vomiting subsides. Call your physician if vomiting continues. ° °Special Instructions/Symptoms: °Your throat may feel dry or sore from the anesthesia or the breathing tube placed in your throat during surgery. If this causes discomfort, gargle with warm salt water. The discomfort should disappear within 24 hours. ° °If you had a scopolamine patch placed behind your ear for the management of post- operative nausea and/or vomiting: ° °1. The medication in the patch is effective for 72 hours, after which it should be removed.  Wrap patch in a tissue and discard in the trash. Wash hands thoroughly with soap and water. °2. You may remove the patch earlier than 72 hours if you experience unpleasant side effects which may include dry mouth, dizziness or visual disturbances. °3. Avoid touching the patch. Wash your hands with soap and water after contact with the patch. °  °DISCHARGE INSTRUCTIONS: D&C / D&E °The following instructions have been prepared to help you care for yourself upon your return home. °  °Personal hygiene: °• Use sanitary pads for vaginal drainage, not tampons. °• Shower the day after your procedure. °• NO tub baths, pools or Jacuzzis for 2-3 weeks. °• Wipe front to back after using the bathroom. ° °Activity and limitations: °• Do NOT drive or operate any equipment for 24 hours. The effects of anesthesia are  still present and drowsiness may result. °• Do NOT rest in bed all day. °• Walking is encouraged. °• Walk up and down stairs slowly. °• You may resume your normal activity in one to two days or as indicated by your physician. ° °Sexual activity: NO intercourse for at least 2 weeks after the procedure, or as indicated by your physician. ° °Diet: Eat a light meal as desired this evening. You may resume your usual diet tomorrow. ° °Return to work: You may resume your work activities in one to two days or as indicated by your doctor. ° °What to expect after your surgery: Expect to have vaginal bleeding/discharge for 2-3 days and spotting for up to 10 days. It is not unusual to have soreness for up to 1-2 weeks. You may have a slight burning sensation when you urinate for the first day. Mild cramps may continue for a couple of days. You may have a regular period in 2-6 weeks. ° °Call your doctor for any of the following: °• Excessive vaginal bleeding, saturating and changing one pad every hour. °• Inability to urinate 6 hours after discharge from hospital. °• Pain not relieved by pain medication. °• Fever of 100.4° F or greater. °• Unusual vaginal discharge or odor. ° ° Call for an appointment:  ° ° °Patient’s signature: ______________________ ° °Nurse’s signature ________________________ ° °Support person's signature_______________________ ° ° ° °

## 2015-07-18 NOTE — Anesthesia Postprocedure Evaluation (Signed)
  Anesthesia Post-op Note  Patient: Mindy Reed  Procedure(s) Performed: Procedure(s): DILATATION AND CURETTAGE /HYSTEROSCOPY (N/A) INTRAUTERINE DEVICE (IUD) INSERTION (N/A)  Patient Location: PACU  Anesthesia Type:General  Level of Consciousness: awake, alert  and oriented  Airway and Oxygen Therapy: Patient Spontanous Breathing  Post-op Pain: mild  Post-op Assessment: Post-op Vital signs reviewed and Patient's Cardiovascular Status Stable              Post-op Vital Signs: Reviewed and stable  Last Vitals:  Filed Vitals:   07/18/15 1400  BP: 153/112  Pulse: 95  Temp:   Resp: 23    Complications: No apparent anesthesia complications

## 2015-07-19 ENCOUNTER — Encounter (HOSPITAL_COMMUNITY): Payer: Self-pay | Admitting: Obstetrics and Gynecology

## 2015-08-10 ENCOUNTER — Ambulatory Visit: Payer: Self-pay | Admitting: Surgery

## 2015-08-10 NOTE — H&P (Signed)
Chief Complaint:  Super morbid obesity Bmi 70.48  History of Present Illness:  Mindy Reed is an 34 y.o. female seen by me on August 5th for bariatric surgery options.  The patient is a 34 year old female who presents for a bariatric surgery evaluation. She presents with her mother to discuss her bariatric options. She is a 35 year old white female who has a 52-month-old who has a high risk pregnancy patient was followed at [facility]. She said she had been on a diet and lost some weight and get her diabetes under control when she became pregnant. She is been studying bariatric surgery over several years and only saw she can do it on her own but after 10 years she has decided she needs to have the help of surgery. She is maintained a consistent weight over 400 pounds for several years. Weight are documented going back to 2012 where she was 432 pounds. Her comorbidities include gastroesophageal reflux disease, hyperlipidemia, enlarged heart, asthma, type 2 diabetes, osteoarthritis of left knee. She's tried Redux as a teenager, phentermine is an adult with some weight loss. She's tried many other diets including fasting diets, low fat diets, low carb diets and Weight Watchers. She's had physician supervised weight loss with Dr. Nolon Rod for stone with no lasting results. She presents her R and is interested in a bypass possibly. She got this from one of her close friends who has had bypass surgery. However as I went over everything she has a weight of 440.6 with a BMI of 69 and it may not be that we will be able to get a Roux-en-Y mobilized to her pouch. In this case it may be necessary to do a sleeve gastrectomy. I have discussed both operations with her in detail and we will get permits for both. We will approach this and try to do a lap Roux-en-Y gastric bypass but if that is not possible we will then consider a sleeve gastrectomy.  She is aware that her size may preclude roux en Y and will do a  sleeve instead.    Past Medical History  Diagnosis Date  . Hypertension   . Asthma     seasonal  . Diabetes mellitus without complication     Type 2   . Depression   . Anxiety   . GERD (gastroesophageal reflux disease)     no meds currently  . Vaginal delivery 2015    Past Surgical History  Procedure Laterality Date  . Cholecystectomy    . Tonsillectomy    . Ovarian cyst removal    . Ankle surgery    . Hysteroscopy w/d&c N/A 07/18/2015    Procedure: DILATATION AND CURETTAGE /HYSTEROSCOPY;  Surgeon: Everlene Farrier, MD;  Location: [facility];  Service: Gynecology;  Laterality: N/A;  . Intrauterine device (iud) insertion N/A 07/18/2015    Procedure: INTRAUTERINE DEVICE (IUD) INSERTION;  Surgeon: Everlene Farrier, MD;  Location: [facility];  Service: Gynecology;  Laterality: N/A;    Current Outpatient Prescriptions  Medication Sig Dispense Refill  . aspirin EC 81 MG tablet Take 81 mg by mouth daily.    . Biotin 5000 MCG CAPS Take 5,000 mcg by mouth daily.     . Cyanocobalamin (VITAMIN B-12) 5000 MCG SUBL Place 5,000 tablets under the tongue daily.     Marland Kitchen escitalopram (LEXAPRO) 10 MG tablet Take 10 mg by mouth daily.    . ferrous sulfate 325 (65 FE) MG tablet Take 325 mg by mouth 2 (two) times daily  with a meal.    . fexofenadine (ALLEGRA) 180 MG tablet Take 180 mg by mouth daily.    Marland Kitchen glimepiride (AMARYL) 4 MG tablet Take 4 mg by mouth daily with breakfast.    . hydrochlorothiazide (HYDRODIURIL) 25 MG tablet Take 25 mg by mouth daily.    . insulin glargine (LANTUS) 100 UNIT/ML injection Inject 80 Units into the skin at bedtime.     . metFORMIN (GLUCOPHAGE) 1000 MG tablet Take 1,000 mg by mouth 2 (two) times daily with a meal.    . Multiple Vitamins-Minerals (MULTIVITAMIN & MINERAL PO) Take 1 tablet by mouth daily.      No current facility-administered medications for this visit.   Morphine and related No family history on file. Social History:   reports that she has never smoked. She does  not have any smokeless tobacco history on file. She reports that she does not drink alcohol or use illicit drugs.   REVIEW OF SYSTEMS : Negative except for see problem list  Physical Exam:   Last menstrual period 07/06/2015. There is no weight on file to calculate BMI.  Gen:  WDWN WF NAD  Neurological: Alert and oriented to person, place, and time. Motor and sensory function is grossly intact  Head: Normocephalic and atraumatic.  Eyes: Conjunctivae are normal. Pupils are equal, round, and reactive to light. No scleral icterus.  Neck: Normal range of motion. Neck supple. No tracheal deviation or thyromegaly present.  Cardiovascular:  SR without murmurs or gallops.  No carotid bruits Breast:  Some hidradenitis beneath the breasts.  I have requested that she bathe with hibiclens and apply Neosporin ointment to these each day.  Respiratory: Effort normal.  No respiratory distress. No chest wall tenderness. Breath sounds normal.  No wheezes, rales or rhonchi.  Abdomen:  Obese and nontender GU:  Not examined Musculoskeletal: Normal range of motion. Extremities are nontender. No cyanosis, edema or clubbing noted Lymphadenopathy: No cervical, preauricular, postauricular or axillary adenopathy is present Skin: Skin is warm and dry. No rash noted. No diaphoresis. No erythema. No pallor. Pscyh: Normal mood and affect. Behavior is normal. Judgment and thought content normal.   LABORATORY RESULTS: No results found for this or any previous visit (from the past 48 hour(s)).   RADIOLOGY RESULTS: No results found.  Problem List: There are no active problems to display for this patient.   Assessment & Plan: Super morbid obesity;  For lap roux en Y gastric bypass possible sleeve gastrectomy    Matt B. Hassell Done, MD, [facility] [phone] beeper [phone]  08/10/2015 4:42 PM

## 2015-08-14 ENCOUNTER — Ambulatory Visit: Payer: BC Managed Care – PPO

## 2015-08-17 ENCOUNTER — Encounter (HOSPITAL_COMMUNITY)
Admission: RE | Admit: 2015-08-17 | Discharge: 2015-08-17 | Disposition: A | Discharge status: Home / Self Care | Payer: BC Managed Care – PPO | Source: Ambulatory Visit | Attending: Surgery | Admitting: Surgery

## 2015-08-17 ENCOUNTER — Encounter (INDEPENDENT_AMBULATORY_CARE_PROVIDER_SITE_OTHER): Payer: Self-pay

## 2015-08-17 ENCOUNTER — Encounter (HOSPITAL_COMMUNITY): Payer: Self-pay

## 2015-08-17 DIAGNOSIS — Z01818 Encounter for other preprocedural examination: Secondary | ICD-10-CM | POA: Diagnosis present

## 2015-08-17 HISTORY — DX: Pneumonia, unspecified organism: J18.9

## 2015-08-17 LAB — COMPREHENSIVE METABOLIC PANEL
ALBUMIN: 3.7 g/dL (ref 3.5–5.0)
ALT: 24 U/L (ref 14–54)
AST: 31 U/L (ref 15–41)
Alkaline Phosphatase: 54 U/L (ref 38–126)
Anion gap: 9 (ref 5–15)
BILIRUBIN TOTAL: 0.8 mg/dL (ref 0.3–1.2)
BUN: 14 mg/dL (ref 6–20)
CHLORIDE: 101 mmol/L (ref 101–111)
CO2: 30 mmol/L (ref 22–32)
Calcium: 9.5 mg/dL (ref 8.9–10.3)
Creatinine, Ser: 0.75 mg/dL (ref 0.44–1.00)
GFR calc Af Amer: >60 mL/min (ref >60)
GFR calc non Af Amer: >60 mL/min (ref >60)
GLUCOSE: 152 mg/dL — AB (ref 65–99)
POTASSIUM: 3.8 mmol/L (ref 3.5–5.1)
Sodium: 140 mmol/L (ref 135–145)
Total Protein: 8.4 g/dL — ABNORMAL HIGH (ref 6.5–8.1)

## 2015-08-17 LAB — CBC WITH DIFFERENTIAL/PLATELET
BASOS ABS: 0.1 10*3/uL (ref 0.0–0.1)
BASOS PCT: 1 %
Eosinophils Absolute: 0.2 10*3/uL (ref 0.0–0.7)
Eosinophils Relative: 2 %
HEMATOCRIT: 37.4 % (ref 36.0–46.0)
Hemoglobin: 12 g/dL (ref 12.0–15.0)
Lymphocytes Relative: 17 %
Lymphs Abs: 1.7 10*3/uL (ref 0.7–4.0)
MCH: 27.1 pg (ref 26.0–34.0)
MCHC: 32.1 g/dL (ref 30.0–36.0)
MCV: 84.6 fL (ref 78.0–100.0)
MONO ABS: 0.8 10*3/uL (ref 0.1–1.0)
Monocytes Relative: 8 %
NEUTROS ABS: 7.4 10*3/uL (ref 1.7–7.7)
Neutrophils Relative %: 72 %
PLATELETS: 467 10*3/uL — AB (ref 150–400)
RBC: 4.42 MIL/uL (ref 3.87–5.11)
RDW: 14.8 % (ref 11.5–15.5)
WBC: 10.1 10*3/uL (ref 4.0–10.5)

## 2015-08-17 LAB — HCG, SERUM, QUALITATIVE: Preg, Serum: NEGATIVE

## 2015-08-17 NOTE — Patient Instructions (Addendum)
Mindy Reed  08/17/2015   Your procedure is scheduled on: Monday 08/27/15  Report to [facility] 2 Main  Entrance take [facility]  elevators to 3rd floor to  [place] at 10:30AM.  Call this number if you have problems the morning of surgery [phone]   Remember: ONLY 1 PERSON MAY GO WITH YOU TO SHORT STAY TO GET  READY MORNING OF [place].               **Take HALF of your Lantus dose the night before surgery             Do not eat food or drink liquids :After Midnight.     Take these medicines the morning of surgery with A SIP OF WATER: Allegra if needed, Lexapro             DO NOT TAKE ANY DIABETIC MEDICATIONS DAY OF YOUR SURGERY                               You may not have any metal on your body including hair pins and              piercings  Do not wear jewelry, make-up, lotions, powders or perfumes, deodorant             Do not wear nail polish.  Do not shave  48 hours prior to surgery.              Men may shave face and neck.   Do not bring valuables to the hospital. [place].  Contacts, dentures or bridgework may not be worn into surgery.  Leave suitcase in the car. After surgery it may be brought to your room.    Special Instructions: practice deep breathing and leg exercises              Please read over the following fact sheets you were given: _____________________________________________________________________             [facility] - Preparing for Surgery Before surgery, you can play an important role.  Because skin is not sterile, your skin needs to be as free of germs as possible.  You can reduce the number of germs on your skin by washing with CHG (chlorahexidine gluconate) soap before surgery.  CHG is an antiseptic cleaner which kills germs and bonds with the skin to continue killing germs even after washing. Please DO NOT use if you have an allergy to CHG or antibacterial  soaps.  If your skin becomes reddened/irritated stop using the CHG and inform your nurse when you arrive at Short Stay. Do not shave (including legs and underarms) for at least 48 hours prior to the first CHG shower.  You may shave your face/neck. Please follow these instructions carefully:  1.  Shower with CHG Soap the night before surgery and the  morning of Surgery.  2.  If you choose to wash your hair, wash your hair first as usual with your  normal  shampoo.  3.  After you shampoo, rinse your hair and body thoroughly to remove the  shampoo.  4.  Use CHG as you would any other liquid soap.  You can apply chg directly  to the skin and wash                       Gently with a scrungie or clean washcloth.  5.  Apply the CHG Soap to your body ONLY FROM THE NECK DOWN.   Do not use on face/ open                           Wound or open sores. Avoid contact with eyes, ears mouth and genitals (private parts).                       Wash face,  Genitals (private parts) with your normal soap.             6.  Wash thoroughly, paying special attention to the area where your surgery  will be performed.  7.  Thoroughly rinse your body with warm water from the neck down.  8.  DO NOT shower/wash with your normal soap after using and rinsing off  the CHG Soap.                9.  Pat yourself dry with a clean towel.            10.  Wear clean pajamas.            11.  Place clean sheets on your bed the night of your first shower and do not  sleep with pets. Day of Surgery : Do not apply any lotions/deodorants the morning of surgery.  Please wear clean clothes to the hospital/surgery center.  FAILURE TO FOLLOW THESE INSTRUCTIONS MAY RESULT IN THE CANCELLATION OF YOUR SURGERY PATIENT SIGNATURE_________________________________  NURSE SIGNATURE__________________________________  ________________________________________________________________________

## 2015-08-17 NOTE — Pre-Procedure Instructions (Addendum)
EKG 06-22-15 epic CXR 06-22-15 epic  Instructed pt to follow Dr. Earlie Server instructions on bowel regimen prior to surgery.  Pt received instructions at prior office visit.

## 2015-08-27 ENCOUNTER — Encounter (HOSPITAL_COMMUNITY)
Admission: RE | Disposition: A | Discharge status: Home / Self Care | Payer: Self-pay | Source: Ambulatory Visit | Attending: Surgery

## 2015-08-27 ENCOUNTER — Inpatient Hospital Stay (HOSPITAL_COMMUNITY)
Admission: RE | Admit: 2015-08-27 | Discharge: 2015-08-29 | DRG: 621 | Disposition: A | Discharge status: Home / Self Care | Payer: BC Managed Care – PPO | Source: Ambulatory Visit | Attending: Surgery | Admitting: Surgery

## 2015-08-27 ENCOUNTER — Encounter (HOSPITAL_COMMUNITY): Payer: Self-pay | Admitting: Certified Registered Nurse Anesthetist

## 2015-08-27 ENCOUNTER — Inpatient Hospital Stay (HOSPITAL_COMMUNITY): Payer: BC Managed Care – PPO | Admitting: Certified Registered Nurse Anesthetist

## 2015-08-27 DIAGNOSIS — I517 Cardiomegaly: Secondary | ICD-10-CM | POA: Diagnosis present

## 2015-08-27 DIAGNOSIS — I10 Essential (primary) hypertension: Secondary | ICD-10-CM | POA: Diagnosis present

## 2015-08-27 DIAGNOSIS — F329 Major depressive disorder, single episode, unspecified: Secondary | ICD-10-CM | POA: Diagnosis present

## 2015-08-27 DIAGNOSIS — J45909 Unspecified asthma, uncomplicated: Secondary | ICD-10-CM | POA: Diagnosis present

## 2015-08-27 DIAGNOSIS — Z6841 Body Mass Index (BMI) 40.0 and over, adult: Secondary | ICD-10-CM

## 2015-08-27 DIAGNOSIS — F419 Anxiety disorder, unspecified: Secondary | ICD-10-CM | POA: Diagnosis present

## 2015-08-27 DIAGNOSIS — K219 Gastro-esophageal reflux disease without esophagitis: Secondary | ICD-10-CM | POA: Diagnosis present

## 2015-08-27 DIAGNOSIS — Z01812 Encounter for preprocedural laboratory examination: Secondary | ICD-10-CM

## 2015-08-27 DIAGNOSIS — Z794 Long term (current) use of insulin: Secondary | ICD-10-CM | POA: Diagnosis not present

## 2015-08-27 DIAGNOSIS — Z7982 Long term (current) use of aspirin: Secondary | ICD-10-CM

## 2015-08-27 DIAGNOSIS — K660 Peritoneal adhesions (postprocedural) (postinfection): Secondary | ICD-10-CM | POA: Diagnosis present

## 2015-08-27 DIAGNOSIS — M1712 Unilateral primary osteoarthritis, left knee: Secondary | ICD-10-CM | POA: Diagnosis present

## 2015-08-27 DIAGNOSIS — Z9884 Bariatric surgery status: Secondary | ICD-10-CM

## 2015-08-27 DIAGNOSIS — E785 Hyperlipidemia, unspecified: Secondary | ICD-10-CM | POA: Diagnosis present

## 2015-08-27 DIAGNOSIS — Z79899 Other long term (current) drug therapy: Secondary | ICD-10-CM | POA: Diagnosis not present

## 2015-08-27 DIAGNOSIS — E119 Type 2 diabetes mellitus without complications: Secondary | ICD-10-CM | POA: Diagnosis present

## 2015-08-27 DIAGNOSIS — Z9889 Other specified postprocedural states: Secondary | ICD-10-CM | POA: Diagnosis not present

## 2015-08-27 HISTORY — PX: LAPAROSCOPIC ROUX-EN-Y GASTRIC BYPASS WITH HIATAL HERNIA REPAIR: SHX6513

## 2015-08-27 HISTORY — PX: DG BARIUM SWALLOW (ARMC HX): HXRAD1448

## 2015-08-27 HISTORY — PX: UPPER GI ENDOSCOPY: SHX6162

## 2015-08-27 LAB — CBC
HCT: 36.1 % (ref 36.0–46.0)
Hemoglobin: 11.4 g/dL — ABNORMAL LOW (ref 12.0–15.0)
MCH: 26.7 pg (ref 26.0–34.0)
MCHC: 31.6 g/dL (ref 30.0–36.0)
MCV: 84.5 fL (ref 78.0–100.0)
PLATELETS: 419 10*3/uL — AB (ref 150–400)
RBC: 4.27 MIL/uL (ref 3.87–5.11)
RDW: 15.1 % (ref 11.5–15.5)
WBC: 17.6 10*3/uL — AB (ref 4.0–10.5)

## 2015-08-27 LAB — GLUCOSE, CAPILLARY
GLUCOSE-CAPILLARY: 206 mg/dL — AB (ref 65–99)
Glucose-Capillary: 137 mg/dL — ABNORMAL HIGH (ref 65–99)
Glucose-Capillary: 223 mg/dL — ABNORMAL HIGH (ref 65–99)

## 2015-08-27 LAB — CREATININE, SERUM: CREATININE: 0.84 mg/dL (ref 0.44–1.00)

## 2015-08-27 LAB — PREGNANCY, URINE: PREG TEST UR: NEGATIVE

## 2015-08-27 SURGERY — CREATION, GASTRIC BYPASS, LAPAROSCOPIC, USING ROUX-EN-Y GASTROENTEROSTOMY, WITH HIATAL HERNIA REPAIR

## 2015-08-27 MED ORDER — GLYCOPYRROLATE 0.2 MG/ML IJ SOLN
INTRAMUSCULAR | Status: AC
  Filled 2015-08-27: qty 3

## 2015-08-27 MED ORDER — FENTANYL CITRATE (PF) 100 MCG/2ML IJ SOLN
INTRAMUSCULAR | Status: DC | PRN
  Administered 2015-08-27: 50 ug via INTRAVENOUS
  Administered 2015-08-27 (×2): 100 ug via INTRAVENOUS
  Administered 2015-08-27 (×3): 50 ug via INTRAVENOUS

## 2015-08-27 MED ORDER — SODIUM CHLORIDE 0.9 % IJ SOLN
INTRAMUSCULAR | Status: AC
  Filled 2015-08-27: qty 20

## 2015-08-27 MED ORDER — OXYCODONE HCL 5 MG/5ML PO SOLN
5.0000 mg | ORAL | Status: DC | PRN
  Administered 2015-08-28: 5 mg via ORAL
  Administered 2015-08-28 – 2015-08-29 (×3): 10 mg via ORAL
  Administered 2015-08-29: 5 mg via ORAL
  Administered 2015-08-29: 10 mg via ORAL
  Filled 2015-08-27 (×4): qty 10
  Filled 2015-08-27 (×2): qty 5

## 2015-08-27 MED ORDER — EPHEDRINE SULFATE 50 MG/ML IJ SOLN
INTRAMUSCULAR | Status: AC
  Filled 2015-08-27: qty 1

## 2015-08-27 MED ORDER — ROCURONIUM BROMIDE 100 MG/10ML IV SOLN
INTRAVENOUS | Status: DC | PRN
  Administered 2015-08-27 (×4): 20 mg via INTRAVENOUS
  Administered 2015-08-27: 50 mg via INTRAVENOUS
  Administered 2015-08-27 (×2): 20 mg via INTRAVENOUS

## 2015-08-27 MED ORDER — MIDAZOLAM HCL 2 MG/2ML IJ SOLN
INTRAMUSCULAR | Status: AC
  Filled 2015-08-27: qty 4

## 2015-08-27 MED ORDER — NEOSTIGMINE METHYLSULFATE 10 MG/10ML IV SOLN
INTRAVENOUS | Status: DC | PRN
  Administered 2015-08-27: 5 mg via INTRAVENOUS

## 2015-08-27 MED ORDER — PROPOFOL 10 MG/ML IV BOLUS
INTRAVENOUS | Status: AC
  Filled 2015-08-27: qty 20

## 2015-08-27 MED ORDER — LACTATED RINGERS IR SOLN
Status: DC | PRN
  Administered 2015-08-27: 1000 mL

## 2015-08-27 MED ORDER — HEPARIN SODIUM (PORCINE) 5000 UNIT/ML IJ SOLN
5000.0000 [IU] | INTRAMUSCULAR | Status: AC
  Administered 2015-08-27: 5000 [IU] via SUBCUTANEOUS
  Filled 2015-08-27: qty 1

## 2015-08-27 MED ORDER — GLYCOPYRROLATE 0.2 MG/ML IJ SOLN
INTRAMUSCULAR | Status: DC | PRN
  Administered 2015-08-27: 0.6 mg via INTRAVENOUS

## 2015-08-27 MED ORDER — CHLORHEXIDINE GLUCONATE 0.12 % MT SOLN
15.0000 mL | Freq: Two times a day (BID) | OROMUCOSAL | Status: DC
  Administered 2015-08-27: 15 mL via OROMUCOSAL
  Filled 2015-08-27 (×3): qty 15

## 2015-08-27 MED ORDER — HYDROMORPHONE HCL 1 MG/ML IJ SOLN
INTRAMUSCULAR | Status: AC
  Administered 2015-08-27: 0.25 mg via INTRAVENOUS
  Filled 2015-08-27: qty 1

## 2015-08-27 MED ORDER — ONDANSETRON HCL 4 MG/2ML IJ SOLN
INTRAMUSCULAR | Status: DC | PRN
  Administered 2015-08-27: 4 mg via INTRAVENOUS

## 2015-08-27 MED ORDER — INSULIN ASPART 100 UNIT/ML ~~LOC~~ SOLN
0.0000 [IU] | SUBCUTANEOUS | Status: DC
  Administered 2015-08-27 (×2): 7 [IU] via SUBCUTANEOUS
  Administered 2015-08-28: 3 [IU] via SUBCUTANEOUS
  Administered 2015-08-28: 4 [IU] via SUBCUTANEOUS
  Administered 2015-08-28 (×2): 7 [IU] via SUBCUTANEOUS
  Administered 2015-08-28: 3 [IU] via SUBCUTANEOUS
  Administered 2015-08-28 – 2015-08-29 (×5): 4 [IU] via SUBCUTANEOUS

## 2015-08-27 MED ORDER — SODIUM CHLORIDE 0.9 % IJ SOLN
INTRAMUSCULAR | Status: AC
  Filled 2015-08-27: qty 10

## 2015-08-27 MED ORDER — MEPERIDINE HCL 50 MG/ML IJ SOLN: 6.2500 mg | INTRAMUSCULAR | Status: DC | PRN

## 2015-08-27 MED ORDER — KCL IN DEXTROSE-NACL 20-5-0.45 MEQ/L-%-% IV SOLN
INTRAVENOUS | Status: DC
  Administered 2015-08-27 – 2015-08-28 (×3): via INTRAVENOUS
  Filled 2015-08-27 (×6): qty 1000

## 2015-08-27 MED ORDER — INSULIN ASPART 100 UNIT/ML ~~LOC~~ SOLN
SUBCUTANEOUS | Status: AC
  Filled 2015-08-27: qty 1

## 2015-08-27 MED ORDER — CETYLPYRIDINIUM CHLORIDE 0.05 % MT LIQD: 7.0000 mL | Freq: Two times a day (BID) | OROMUCOSAL | Status: DC

## 2015-08-27 MED ORDER — DEXAMETHASONE SODIUM PHOSPHATE 10 MG/ML IJ SOLN
INTRAMUSCULAR | Status: AC
  Filled 2015-08-27: qty 1

## 2015-08-27 MED ORDER — BUPIVACAINE LIPOSOME 1.3 % IJ SUSP
INTRAMUSCULAR | Status: DC | PRN
  Administered 2015-08-27: 20 mL

## 2015-08-27 MED ORDER — LIDOCAINE HCL (CARDIAC) 20 MG/ML IV SOLN
INTRAVENOUS | Status: DC | PRN
  Administered 2015-08-27: 100 mg via INTRAVENOUS

## 2015-08-27 MED ORDER — TISSEEL VH 10 ML EX KIT
PACK | CUTANEOUS | Status: AC
  Filled 2015-08-27: qty 1

## 2015-08-27 MED ORDER — HEPARIN SODIUM (PORCINE) 5000 UNIT/ML IJ SOLN
5000.0000 [IU] | Freq: Three times a day (TID) | INTRAMUSCULAR | Status: DC
  Administered 2015-08-27 – 2015-08-29 (×5): 5000 [IU] via SUBCUTANEOUS
  Filled 2015-08-27 (×8): qty 1

## 2015-08-27 MED ORDER — PROMETHAZINE HCL 25 MG/ML IJ SOLN
6.2500 mg | Freq: Once | INTRAMUSCULAR | Status: AC
  Administered 2015-08-27: 6.25 mg via INTRAVENOUS

## 2015-08-27 MED ORDER — ACETAMINOPHEN 160 MG/5ML PO SOLN: 325.0000 mg | ORAL | Status: DC | PRN

## 2015-08-27 MED ORDER — MORPHINE SULFATE (PF) 2 MG/ML IV SOLN: 2.0000 mg | INTRAVENOUS | Status: DC | PRN

## 2015-08-27 MED ORDER — MIDAZOLAM HCL 5 MG/5ML IJ SOLN
INTRAMUSCULAR | Status: DC | PRN
  Administered 2015-08-27: 2 mg via INTRAVENOUS

## 2015-08-27 MED ORDER — HYDROMORPHONE HCL 1 MG/ML IJ SOLN
0.5000 mg | INTRAMUSCULAR | Status: DC | PRN
  Administered 2015-08-27 – 2015-08-28 (×2): 1 mg via INTRAVENOUS
  Filled 2015-08-27 (×2): qty 1

## 2015-08-27 MED ORDER — SODIUM CHLORIDE 0.9 % IJ SOLN
INTRAMUSCULAR | Status: DC | PRN
  Administered 2015-08-27: 20 mL via INTRAVENOUS

## 2015-08-27 MED ORDER — FENTANYL CITRATE (PF) 250 MCG/5ML IJ SOLN
INTRAMUSCULAR | Status: AC
  Filled 2015-08-27: qty 25

## 2015-08-27 MED ORDER — ONDANSETRON HCL 4 MG/2ML IJ SOLN
4.0000 mg | INTRAMUSCULAR | Status: DC | PRN
  Administered 2015-08-27 – 2015-08-29 (×4): 4 mg via INTRAVENOUS
  Filled 2015-08-27 (×5): qty 2

## 2015-08-27 MED ORDER — LABETALOL HCL 5 MG/ML IV SOLN
INTRAVENOUS | Status: DC | PRN
  Administered 2015-08-27 (×2): 2.5 mg via INTRAVENOUS
  Administered 2015-08-27: 5 mg via INTRAVENOUS

## 2015-08-27 MED ORDER — DEXTROSE 5 % IV SOLN
INTRAVENOUS | Status: AC
  Filled 2015-08-27: qty 2

## 2015-08-27 MED ORDER — LACTATED RINGERS IV SOLN
INTRAVENOUS | Status: DC
  Administered 2015-08-27: 14:00:00 via INTRAVENOUS
  Administered 2015-08-27: 1000 mL via INTRAVENOUS
  Administered 2015-08-27: 16:00:00 via INTRAVENOUS

## 2015-08-27 MED ORDER — PANTOPRAZOLE SODIUM 40 MG IV SOLR
40.0000 mg | Freq: Every day | INTRAVENOUS | Status: DC
  Administered 2015-08-27 – 2015-08-28 (×2): 40 mg via INTRAVENOUS
  Filled 2015-08-27 (×3): qty 40

## 2015-08-27 MED ORDER — 0.9 % SODIUM CHLORIDE (POUR BTL) OPTIME
TOPICAL | Status: DC | PRN
  Administered 2015-08-27: 1000 mL

## 2015-08-27 MED ORDER — PROPOFOL 10 MG/ML IV BOLUS
INTRAVENOUS | Status: DC | PRN
  Administered 2015-08-27: 200 mg via INTRAVENOUS

## 2015-08-27 MED ORDER — NEOSTIGMINE METHYLSULFATE 10 MG/10ML IV SOLN
INTRAVENOUS | Status: AC
  Filled 2015-08-27: qty 1

## 2015-08-27 MED ORDER — PREMIER PROTEIN SHAKE
2.0000 [oz_av] | Freq: Four times a day (QID) | ORAL | Status: DC
  Administered 2015-08-29 (×3): 2 [oz_av] via ORAL
  Administered 2015-08-29: 8 [oz_av] via ORAL

## 2015-08-27 MED ORDER — LIDOCAINE HCL (CARDIAC) 20 MG/ML IV SOLN
INTRAVENOUS | Status: AC
  Filled 2015-08-27: qty 5

## 2015-08-27 MED ORDER — HYDROMORPHONE HCL 1 MG/ML IJ SOLN
0.2500 mg | INTRAMUSCULAR | Status: DC | PRN
  Administered 2015-08-27: 0.25 mg via INTRAVENOUS
  Administered 2015-08-27: 0.5 mg via INTRAVENOUS
  Administered 2015-08-27: 0.25 mg via INTRAVENOUS

## 2015-08-27 MED ORDER — ONDANSETRON HCL 4 MG/2ML IJ SOLN
INTRAMUSCULAR | Status: AC
  Filled 2015-08-27: qty 2

## 2015-08-27 MED ORDER — ONDANSETRON HCL 4 MG/2ML IJ SOLN: 4.0000 mg | Freq: Once | INTRAMUSCULAR | Status: DC | PRN

## 2015-08-27 MED ORDER — BUPIVACAINE LIPOSOME 1.3 % IJ SUSP
20.0000 mL | Freq: Once | INTRAMUSCULAR | Status: DC
  Filled 2015-08-27: qty 20

## 2015-08-27 MED ORDER — DEXTROSE 5 % IV SOLN
2.0000 g | INTRAVENOUS | Status: AC
  Administered 2015-08-27 (×2): 2 g via INTRAVENOUS

## 2015-08-27 MED ORDER — ACETAMINOPHEN 160 MG/5ML PO SOLN
650.0000 mg | ORAL | Status: DC | PRN
  Administered 2015-08-29: 650 mg via ORAL
  Filled 2015-08-27: qty 20.3

## 2015-08-27 MED ORDER — PROMETHAZINE HCL 25 MG/ML IJ SOLN
INTRAMUSCULAR | Status: AC
  Administered 2015-08-27: 6.25 mg via INTRAVENOUS
  Filled 2015-08-27: qty 1

## 2015-08-27 MED ORDER — EVICEL 5 ML EX KIT
PACK | CUTANEOUS | Status: DC | PRN
  Administered 2015-08-27: 1

## 2015-08-27 MED ORDER — PROMETHAZINE HCL 25 MG/ML IJ SOLN: 6.2500 mg | Freq: Once | INTRAMUSCULAR | Status: DC

## 2015-08-27 MED ORDER — ROCURONIUM BROMIDE 100 MG/10ML IV SOLN
INTRAVENOUS | Status: AC
  Filled 2015-08-27: qty 1

## 2015-08-27 SURGICAL SUPPLY — 68 items
APPLICATOR COTTON TIP 6IN STRL (MISCELLANEOUS) ×6 IMPLANT
APPLIER CLIP ROT 10 11.4 M/L (STAPLE)
APPLIER CLIP ROT 13.4 12 LRG (CLIP)
BENZOIN TINCTURE PRP APPL 2/3 (GAUZE/BANDAGES/DRESSINGS) IMPLANT
BLADE SURG 15 STRL LF DISP TIS (BLADE) ×2 IMPLANT
BLADE SURG 15 STRL SS (BLADE) ×1
CABLE HIGH FREQUENCY MONO STRZ (ELECTRODE) IMPLANT
CLIP APPLIE ROT 10 11.4 M/L (STAPLE) IMPLANT
CLIP APPLIE ROT 13.4 12 LRG (CLIP) IMPLANT
CLIP SUT LAPRA TY ABSORB (SUTURE) ×6 IMPLANT
COVER SURGICAL LIGHT HANDLE (MISCELLANEOUS) IMPLANT
DEVICE SUT QUICK LOAD TK 5 (STAPLE) IMPLANT
DEVICE SUT TI-KNOT TK 5X26 (MISCELLANEOUS) IMPLANT
DEVICE SUTURE ENDOST 10MM (ENDOMECHANICALS) ×3 IMPLANT
DISSECTOR BLUNT TIP ENDO 5MM (MISCELLANEOUS) IMPLANT
DRAIN PENROSE 18X1/4 LTX STRL (WOUND CARE) ×3 IMPLANT
DRAPE CAMERA CLOSED 9X96 (DRAPES) ×3 IMPLANT
GAUZE SPONGE 4X4 12PLY STRL (GAUZE/BANDAGES/DRESSINGS) IMPLANT
GAUZE SPONGE 4X4 16PLY XRAY LF (GAUZE/BANDAGES/DRESSINGS) ×3 IMPLANT
GLOVE BIOGEL M 8.0 STRL (GLOVE) ×3 IMPLANT
GOWN STRL REUS W/TWL XL LVL3 (GOWN DISPOSABLE) ×12 IMPLANT
HANDLE STAPLE EGIA 4 XL (STAPLE) ×3 IMPLANT
HOVERMATT SINGLE USE (MISCELLANEOUS) ×3 IMPLANT
KIT BASIN OR (CUSTOM PROCEDURE TRAY) ×3 IMPLANT
KIT GASTRIC LAVAGE 34FR ADT (SET/KITS/TRAYS/PACK) ×3 IMPLANT
LIQUID BAND (GAUZE/BANDAGES/DRESSINGS) ×3 IMPLANT
NEEDLE SPNL 22GX3.5 QUINCKE BK (NEEDLE) ×3 IMPLANT
PACK CARDIOVASCULAR III (CUSTOM PROCEDURE TRAY) ×3 IMPLANT
PEN SKIN MARKING BROAD (MISCELLANEOUS) ×3 IMPLANT
RELOAD EGIA 45 MED/THCK PURPLE (STAPLE) ×3 IMPLANT
RELOAD EGIA 45 TAN VASC (STAPLE) IMPLANT
RELOAD EGIA 60 MED/THCK PURPLE (STAPLE) ×6 IMPLANT
RELOAD EGIA 60 TAN VASC (STAPLE) ×6 IMPLANT
RELOAD ENDO STITCH 2.0 (ENDOMECHANICALS) ×9
RELOAD TRI 45 ART MED THCK PUR (STAPLE) IMPLANT
RELOAD TRI 60 ART MED THCK PUR (STAPLE) ×9 IMPLANT
SCISSORS LAP 5X45 EPIX DISP (ENDOMECHANICALS) ×3 IMPLANT
SCRUB PCMX 4 OZ (MISCELLANEOUS) ×6 IMPLANT
SEALANT SURGICAL APPL DUAL CAN (MISCELLANEOUS) ×3 IMPLANT
SET IRRIG TUBING LAPAROSCOPIC (IRRIGATION / IRRIGATOR) ×3 IMPLANT
SHEARS CURVED HARMONIC AC 45CM (MISCELLANEOUS) ×3 IMPLANT
SLEEVE ADV FIXATION 12X100MM (TROCAR) ×12 IMPLANT
SLEEVE ADV FIXATION 5X100MM (TROCAR) ×6 IMPLANT
SOLUTION ANTI FOG 6CC (MISCELLANEOUS) ×3 IMPLANT
STAPLER VISISTAT 35W (STAPLE) ×3 IMPLANT
STRIP CLOSURE SKIN 1/2X4 (GAUZE/BANDAGES/DRESSINGS) IMPLANT
SUT RELOAD ENDO STITCH 2 48X1 (ENDOMECHANICALS) ×10
SUT RELOAD ENDO STITCH 2.0 (ENDOMECHANICALS) ×8
SUT SURGIDAC NAB ES-9 0 48 120 (SUTURE) IMPLANT
SUT VIC AB 2-0 SH 27 (SUTURE) ×1
SUT VIC AB 2-0 SH 27X BRD (SUTURE) ×2 IMPLANT
SUT VIC AB 4-0 SH 18 (SUTURE) ×3 IMPLANT
SUTURE RELOAD END STTCH 2 48X1 (ENDOMECHANICALS) ×10 IMPLANT
SUTURE RELOAD ENDO STITCH 2.0 (ENDOMECHANICALS) ×8 IMPLANT
SYR 10ML ECCENTRIC (SYRINGE) ×3 IMPLANT
SYR 20CC LL (SYRINGE) ×6 IMPLANT
SYR 50ML LL SCALE MARK (SYRINGE) ×3 IMPLANT
TOWEL OR 17X26 10 PK STRL BLUE (TOWEL DISPOSABLE) ×6 IMPLANT
TOWEL OR NON WOVEN STRL DISP B (DISPOSABLE) ×3 IMPLANT
TRAY FOLEY W/METER SILVER 14FR (SET/KITS/TRAYS/PACK) ×3 IMPLANT
TRAY FOLEY W/METER SILVER 16FR (SET/KITS/TRAYS/PACK) IMPLANT
TROCAR ADV FIXATION 12X100MM (TROCAR) ×6 IMPLANT
TROCAR ADV FIXATION 5X100MM (TROCAR) ×6 IMPLANT
TROCAR BLADELESS OPT 5 100 (ENDOMECHANICALS) ×3 IMPLANT
TROCAR XCEL 12X100 BLDLESS (ENDOMECHANICALS) ×3 IMPLANT
TUBING CONNECTING 10 (TUBING) ×3 IMPLANT
TUBING ENDO SMARTCAP PENTAX (MISCELLANEOUS) ×3 IMPLANT
TUBING FILTER THERMOFLATOR (ELECTROSURGICAL) ×3 IMPLANT

## 2015-08-27 NOTE — H&P (View-Only) (Signed)
Chief Complaint:  Super morbid obesity Bmi 70.48  History of Present Illness:  Mindy Reed is an 34 y.o. female seen by me on August 5th for bariatric surgery options.  The patient is a 34 year old female who presents for a bariatric surgery evaluation. She presents with her mother to discuss her bariatric options. She is a 34 year old white female who has a 35-month-old who has a high risk pregnancy patient was followed at [facility]. She said she had been on a diet and lost some weight and get her diabetes under control when she became pregnant. She is been studying bariatric surgery over several years and only saw she can do it on her own but after 10 years she has decided she needs to have the help of surgery. She is maintained a consistent weight over 400 pounds for several years. Weight are documented going back to 2012 where she was 432 pounds. Her comorbidities include gastroesophageal reflux disease, hyperlipidemia, enlarged heart, asthma, type 2 diabetes, osteoarthritis of left knee. She's tried Redux as a teenager, phentermine is an adult with some weight loss. She's tried many other diets including fasting diets, low fat diets, low carb diets and Weight Watchers. She's had physician supervised weight loss with Dr. Nolon Rod for stone with no lasting results. She presents her R and is interested in a bypass possibly. She got this from one of her close friends who has had bypass surgery. However as I went over everything she has a weight of 440.6 with a BMI of 69 and it may not be that we will be able to get a Roux-en-Y mobilized to her pouch. In this case it may be necessary to do a sleeve gastrectomy. I have discussed both operations with her in detail and we will get permits for both. We will approach this and try to do a lap Roux-en-Y gastric bypass but if that is not possible we will then consider a sleeve gastrectomy.  She is aware that her size may preclude roux en Y and will do a  sleeve instead.    Past Medical History  Diagnosis Date  . Hypertension   . Asthma     seasonal  . Diabetes mellitus without complication     Type 2   . Depression   . Anxiety   . GERD (gastroesophageal reflux disease)     no meds currently  . Vaginal delivery 2015    Past Surgical History  Procedure Laterality Date  . Cholecystectomy    . Tonsillectomy    . Ovarian cyst removal    . Ankle surgery    . Hysteroscopy w/d&c N/A 07/18/2015    Procedure: DILATATION AND CURETTAGE /HYSTEROSCOPY;  Surgeon: Everlene Farrier, MD;  Location: [facility];  Service: Gynecology;  Laterality: N/A;  . Intrauterine device (iud) insertion N/A 07/18/2015    Procedure: INTRAUTERINE DEVICE (IUD) INSERTION;  Surgeon: Everlene Farrier, MD;  Location: [facility];  Service: Gynecology;  Laterality: N/A;    Current Outpatient Prescriptions  Medication Sig Dispense Refill  . aspirin EC 81 MG tablet Take 81 mg by mouth daily.    . Biotin 5000 MCG CAPS Take 5,000 mcg by mouth daily.     . Cyanocobalamin (VITAMIN B-12) 5000 MCG SUBL Place 5,000 tablets under the tongue daily.     Marland Kitchen escitalopram (LEXAPRO) 10 MG tablet Take 10 mg by mouth daily.    . ferrous sulfate 325 (65 FE) MG tablet Take 325 mg by mouth 2 (two) times daily  with a meal.    . fexofenadine (ALLEGRA) 180 MG tablet Take 180 mg by mouth daily.    Marland Kitchen glimepiride (AMARYL) 4 MG tablet Take 4 mg by mouth daily with breakfast.    . hydrochlorothiazide (HYDRODIURIL) 25 MG tablet Take 25 mg by mouth daily.    . insulin glargine (LANTUS) 100 UNIT/ML injection Inject 80 Units into the skin at bedtime.     . metFORMIN (GLUCOPHAGE) 1000 MG tablet Take 1,000 mg by mouth 2 (two) times daily with a meal.    . Multiple Vitamins-Minerals (MULTIVITAMIN & MINERAL PO) Take 1 tablet by mouth daily.      No current facility-administered medications for this visit.   Morphine and related No family history on file. Social History:   reports that she has never smoked. She does  not have any smokeless tobacco history on file. She reports that she does not drink alcohol or use illicit drugs.   REVIEW OF SYSTEMS : Negative except for see problem list  Physical Exam:   Last menstrual period 07/06/2015. There is no weight on file to calculate BMI.  Gen:  WDWN WF NAD  Neurological: Alert and oriented to person, place, and time. Motor and sensory function is grossly intact  Head: Normocephalic and atraumatic.  Eyes: Conjunctivae are normal. Pupils are equal, round, and reactive to light. No scleral icterus.  Neck: Normal range of motion. Neck supple. No tracheal deviation or thyromegaly present.  Cardiovascular:  SR without murmurs or gallops.  No carotid bruits Breast:  Some hidradenitis beneath the breasts.  I have requested that she bathe with hibiclens and apply Neosporin ointment to these each day.  Respiratory: Effort normal.  No respiratory distress. No chest wall tenderness. Breath sounds normal.  No wheezes, rales or rhonchi.  Abdomen:  Obese and nontender GU:  Not examined Musculoskeletal: Normal range of motion. Extremities are nontender. No cyanosis, edema or clubbing noted Lymphadenopathy: No cervical, preauricular, postauricular or axillary adenopathy is present Skin: Skin is warm and dry. No rash noted. No diaphoresis. No erythema. No pallor. Pscyh: Normal mood and affect. Behavior is normal. Judgment and thought content normal.   LABORATORY RESULTS: No results found for this or any previous visit (from the past 48 hour(s)).   RADIOLOGY RESULTS: No results found.  Problem List: There are no active problems to display for this patient.   Assessment & Plan: Super morbid obesity;  For lap roux en Y gastric bypass possible sleeve gastrectomy    Matt B. Hassell Done, MD, [facility] [phone] beeper [phone]  08/10/2015 4:42 PM

## 2015-08-27 NOTE — Op Note (Signed)
Surgeon: Althea Grimmer. Hassell Done, MD, FACS Asst:  Greer Pickerel, MD, FACS Anesthesia: General endotracheal Drains: None  Procedure: Laparoscopic Roux en Y gastric bypass with 40 cm BP limb and 100 cm Roux limb, antecolic, antegastric, candy cane to the left.  Closure of Peterson's defect. Upper endoscopy.   Description of Procedure:  The patient was taken to OR 1 at [facility] and given general anesthesia.  The abdomen was prepped with PCMX and draped sterilely.  A time out was performed.  Entry to the abdomen was with a 5 mm Optiview without difficulty.  Patient has BMI or 69+ and had many adhesions to her anterior abdominal wall.  These were taken down with the Harmonic scalpel.  The mesentery appeared to allow the roux creation and elevation to the proximal foregut and hence a gastric bypass was performed instead of a sleeve gastrectomy.    The operation began by identifying the ligament of Treitz. I measured 50 cm downstream and divided the bowel with a 6 cm Covidian stapler brown load.  I sutured a Penrose drain along the Roux limb end.  I measured a 1 meter (100 cm) Roux limb and then placed the distal bowels to the BP limb side by side and performed a stapled jejunojejunostomy with a 6 cm brown load. The common defect was closed from either end with 4-0 Vicryl using the Endo Stitch. The mesenteric defect was closed with a running 2-0 silk using the Endo Stitch. Tisseel was applied to the suture line.  The omentum was divided with the harmonic scalpel.  The Nathanson retractor was inserted in the left lateral segment of liver was retracted. The foregut dissection ensued.  I measured down 6 cm along the lessor curvature and entered the retrogastric space.  The first two firings had no TRS reinforcement and the last several purple loads had TRS.    The Roux limb was then brought up with the candycane pointed left and a back row of sutures of 2-0 Vicryl were placed. I opened along the right side of each structure and  inserted the 4.5 cm stapler to create the gastrojejunostomy. The common defect was closed from either end with 2-0 Vicryl and a second row was placed anterior to that the Ewald tube acting as a stent across the anastomosis. The Penrose drain was removed. Peterson's defect was closed with 2-0 silk.   Endoscopy was performed by Dr. [name] and no bubbles or bleeding were seen.    The incisions were injected with Exparel and were closed with 4-0 Vicryl and Liquiban.    The patient was taken to the recovery room in satisfactory condition.  Matt B. Hassell Done, MD, FACS

## 2015-08-27 NOTE — Anesthesia Preprocedure Evaluation (Signed)
Anesthesia Evaluation  Patient identified by MRN, date of birth, ID band Patient awake    Reviewed: Allergy & Precautions, NPO status , Patient's Chart, lab work & pertinent test results  Airway Mallampati: II  TM Distance: >3 FB Neck ROM: Full    Dental   Pulmonary asthma ,    Pulmonary exam normal        Cardiovascular hypertension, Pt. on medications Normal cardiovascular exam     Neuro/Psych Anxiety Depression    GI/Hepatic GERD  Medicated and Controlled,  Endo/Other  diabetes, Type 2, Insulin Dependent, Oral Hypoglycemic Agents  Renal/GU      Musculoskeletal   Abdominal   Peds  Hematology   Anesthesia Other Findings   Reproductive/Obstetrics                             Anesthesia Physical Anesthesia Plan  ASA: III  Anesthesia Plan: General   Post-op Pain Management:    Induction: Intravenous  Airway Management Planned: Oral ETT  Additional Equipment:   Intra-op Plan:   Post-operative Plan: Extubation in OR  Informed Consent: I have reviewed the patients History and Physical, chart, labs and discussed the procedure including the risks, benefits and alternatives for the proposed anesthesia with the patient or authorized representative who has indicated his/her understanding and acceptance.     Plan Discussed with: CRNA and Surgeon  Anesthesia Plan Comments:         Anesthesia Quick Evaluation

## 2015-08-27 NOTE — Anesthesia Postprocedure Evaluation (Signed)
  Anesthesia Post-op Note  Patient: Mindy Reed  Procedure(s) Performed: Procedure(s): LAPAROSCOPIC ROUX-EN-Y GASTRIC BYPASS WITH HIATAL HERNIA REPAIR UPPER GI ENDOSCOPY  Patient Location: PACU  Anesthesia Type:General  Level of Consciousness: awake, alert , oriented and patient cooperative  Airway and Oxygen Therapy: Patient Spontanous Breathing and Patient connected to nasal cannula oxygen  Post-op Pain: mild  Post-op Assessment: Post-op Vital signs reviewed, Patient's Cardiovascular Status Stable, Respiratory Function Stable, Patent Airway and Pain level controlled, nausea improved              Post-op Vital Signs: Reviewed and stable  Last Vitals:  Filed Vitals:   08/27/15 1800  BP: 146/73  Pulse: 74  Temp: 36.4 C  Resp: 22    Complications: No apparent anesthesia complications

## 2015-08-27 NOTE — Transfer of Care (Signed)
Immediate Anesthesia Transfer of Care Note  Patient: Mindy Reed  Procedure(s) Performed: Procedure(s): LAPAROSCOPIC ROUX-EN-Y GASTRIC BYPASS WITH HIATAL HERNIA REPAIR UPPER GI ENDOSCOPY  Patient Location: PACU  Anesthesia Type:General  Level of Consciousness: sedated  Airway & Oxygen Therapy: Patient Spontanous Breathing and Patient connected to face mask oxygen  Post-op Assessment: Report given to RN and Post -op Vital signs reviewed and stable  Post vital signs: Reviewed and stable  Last Vitals:  Filed Vitals:   08/27/15 0955  BP: 151/80  Pulse: 71  Temp: 36.7 C  Resp: 18    Complications: No apparent anesthesia complications

## 2015-08-27 NOTE — Op Note (Signed)
Mindy Reed [id number] [birth year] 08/27/2015  Preoperative diagnosis: morbid obesity  Postoperative diagnosis: Same   Procedure: Upper endoscopy   Surgeon: Gayland Curry M.D., FACS   Anesthesia: Gen.   Indications for procedure: 34 yo female undergoing a laparoscopic roux en y gastric bypass and an upper endoscopy was requested to evaluate the anastomosis.  Description of procedure: After we have completed the new gastrojejunostomy, I scrubbed out and obtained the Olympus endoscope. I gently placed endoscope in the patient's oropharynx and gently glided it down the esophagus without any difficulty under direct visualization. Once I was in the gastric pouch, I insufflated the pouch was air. The pouch was approximately 6 cm in size (GE jxn at 49 cm, anastomosis at 55cm). I was able to cannulate and advanced the scope through the gastrojejunostomy. Dr. Hassell Done had placed saline in the upper abdomen. Upon further insufflation of the gastric pouch there was no evidence of bubbles. Upon further inspection of the gastric pouch, the mucosa appeared normal. There is no evidence of any mucosal abnormality. The gastric pouch and Roux limb were decompressed. The width of the gastrojejunal anastomosis was at least 3 cm. The scope was withdrawn. The patient tolerated this portion of the procedure well. Please see Dr Earlie Server operative note for details regarding the laparoscopic roux-en-y gastric bypass.  [name]. [name], MD, FACS General, Bariatric, & Minimally Invasive Surgery [facility] [place]

## 2015-08-27 NOTE — Interval H&P Note (Signed)
History and Physical Interval Note:  08/27/2015 12:00 PM  Mindy Reed  has presented today for surgery, with the diagnosis of Morbid Obesity  The various methods of treatment have been discussed with the patient and family. After consideration of risks, benefits and other options for treatment, the patient has consented to  Procedure(s): LAPAROSCOPIC ROUX-EN-Y GASTRIC BYPASS WITH UPPER ENDOSCOPY (N/A) as a surgical intervention .  The patient's history has been reviewed, patient examined, no change in status, stable for surgery.  I have reviewed the patient's chart and labs.  Questions were answered to the patient's satisfaction.     Lynel Forester B

## 2015-08-27 NOTE — Anesthesia Procedure Notes (Addendum)
Procedure Name: Intubation Date/Time: 08/27/2015 1:01 PM Performed by: Maxwell Caul Pre-anesthesia Checklist: Patient identified, Emergency Drugs available, Suction available and Patient being monitored Patient Re-evaluated:Patient Re-evaluated prior to inductionOxygen Delivery Method: Circle System Utilized Preoxygenation: Pre-oxygenation with 100% oxygen Intubation Type: IV induction Ventilation: Mask ventilation without difficulty Laryngoscope Size: Mac and 3 (Intubation by SRNA.) Grade View: Grade II Tube type: Oral Number of attempts: 1 Airway Equipment and Method: Stylet Placement Confirmation: ETT inserted through vocal cords under direct vision,  positive ETCO2 and breath sounds checked- equal and bilateral Secured at: 19 cm Tube secured with: Tape Dental Injury: Teeth and Oropharynx as per pre-operative assessment

## 2015-08-28 ENCOUNTER — Inpatient Hospital Stay (HOSPITAL_COMMUNITY): Payer: BC Managed Care – PPO

## 2015-08-28 ENCOUNTER — Encounter (HOSPITAL_COMMUNITY): Payer: Self-pay | Admitting: Surgery

## 2015-08-28 DIAGNOSIS — Z9884 Bariatric surgery status: Secondary | ICD-10-CM

## 2015-08-28 DIAGNOSIS — Z9889 Other specified postprocedural states: Secondary | ICD-10-CM

## 2015-08-28 HISTORY — DX: Bariatric surgery status: Z98.84

## 2015-08-28 LAB — HEMOGLOBIN AND HEMATOCRIT, BLOOD
HCT: 33.2 % — ABNORMAL LOW (ref 36.0–46.0)
HEMOGLOBIN: 10.4 g/dL — AB (ref 12.0–15.0)

## 2015-08-28 LAB — CBC WITH DIFFERENTIAL/PLATELET
BASOS ABS: 0 10*3/uL (ref 0.0–0.1)
Basophils Relative: 0 %
Eosinophils Absolute: 0 10*3/uL (ref 0.0–0.7)
Eosinophils Relative: 0 %
HEMATOCRIT: 33.5 % — AB (ref 36.0–46.0)
HEMOGLOBIN: 10.7 g/dL — AB (ref 12.0–15.0)
LYMPHS PCT: 6 %
Lymphs Abs: 0.8 10*3/uL (ref 0.7–4.0)
MCH: 27.1 pg (ref 26.0–34.0)
MCHC: 31.9 g/dL (ref 30.0–36.0)
MCV: 84.8 fL (ref 78.0–100.0)
Monocytes Absolute: 0.9 10*3/uL (ref 0.1–1.0)
Monocytes Relative: 8 %
NEUTROS ABS: 10.9 10*3/uL — AB (ref 1.7–7.7)
NEUTROS PCT: 86 %
PLATELETS: 453 10*3/uL — AB (ref 150–400)
RBC: 3.95 MIL/uL (ref 3.87–5.11)
RDW: 15 % (ref 11.5–15.5)
WBC: 12.6 10*3/uL — AB (ref 4.0–10.5)

## 2015-08-28 LAB — GLUCOSE, CAPILLARY
GLUCOSE-CAPILLARY: 216 mg/dL — AB (ref 65–99)
GLUCOSE-CAPILLARY: 161 mg/dL — AB (ref 65–99)
GLUCOSE-CAPILLARY: 179 mg/dL — AB (ref 65–99)
Glucose-Capillary: 153 mg/dL — ABNORMAL HIGH (ref 65–99)
Glucose-Capillary: 189 mg/dL — ABNORMAL HIGH (ref 65–99)
Glucose-Capillary: 238 mg/dL — ABNORMAL HIGH (ref 65–99)

## 2015-08-28 LAB — HEMOGLOBIN A1C
Hgb A1c MFr Bld: 7.2 % — ABNORMAL HIGH (ref 4.8–5.6)
MEAN PLASMA GLUCOSE: 160 mg/dL

## 2015-08-28 NOTE — Progress Notes (Signed)
Patient ID: Mindy Reed, female   DOB: December 07, 1980, 34 y.o.   MRN: [id number] [facility] Progress Note:   1 Day Post-Op  Subjective: Mental status is clear.  Sore but getting around OK Objective: Vital signs in last 24 hours: Temp:  [97.6 F (36.4 C)-98.7 F (37.1 C)] 98.1 F (36.7 C) (11/08 0600) Pulse Rate:  [53-93] 93 (11/08 0600) Resp:  [18-34] 20 (11/08 0600) BP: (123-176)/(70-86) 123/82 mmHg (11/08 0600) SpO2:  [91 %-100 %] 100 % (11/08 0600) Weight:  [197.428 kg (435 lb 4 oz)] 197.428 kg (435 lb 4 oz) (11/07 0955)  Intake/Output from previous day: 11/07 0701 - 11/08 0700 In: 3200 [I.V.:3200] Out: 710 [Urine:710] Intake/Output this shift:    Physical Exam: Work of breathing is not labored.  Pulse has been in the 50s.    Lab Results:  Results for orders placed or performed during the hospital encounter of 08/27/15 (from the past 48 hour(s))  Glucose, capillary     Status: Abnormal   Collection Time: 08/27/15  9:53 AM  Result Value Ref Range   Glucose-Capillary 137 (H) 65 - 99 mg/dL   Comment 1 Notify RN   Pregnancy, urine STAT morning of surgery     Status: None   Collection Time: 08/27/15 10:07 AM  Result Value Ref Range   Preg Test, Ur NEGATIVE NEGATIVE    Comment:        THE SENSITIVITY OF THIS METHODOLOGY IS >20 mIU/mL.   Glucose, capillary     Status: Abnormal   Collection Time: 08/27/15  4:49 PM  Result Value Ref Range   Glucose-Capillary 223 (H) 65 - 99 mg/dL  Hemoglobin A1c     Status: Abnormal   Collection Time: 08/27/15  5:33 PM  Result Value Ref Range   Hgb A1c MFr Bld 7.2 (H) 4.8 - 5.6 %    Comment: (NOTE)         Pre-diabetes: 5.7 - 6.4         Diabetes: >6.4         Glycemic control for adults with diabetes: <7.0    Mean Plasma Glucose 160 mg/dL    Comment: (NOTE) Performed At: [facility] [place], [place] [id number] Lindon Romp MD YW:[id number]   CBC     Status: Abnormal   Collection Time:  08/27/15  6:40 PM  Result Value Ref Range   WBC 17.6 (H) 4.0 - 10.5 K/uL   RBC 4.27 3.87 - 5.11 MIL/uL   Hemoglobin 11.4 (L) 12.0 - 15.0 g/dL   HCT 36.1 36.0 - 46.0 %   MCV 84.5 78.0 - 100.0 fL   MCH 26.7 26.0 - 34.0 pg   MCHC 31.6 30.0 - 36.0 g/dL   RDW 15.1 11.5 - 15.5 %   Platelets 419 (H) 150 - 400 K/uL  Creatinine, serum     Status: None   Collection Time: 08/27/15  6:40 PM  Result Value Ref Range   Creatinine, Ser 0.84 0.44 - 1.00 mg/dL   GFR calc non Af Amer >60 >60 mL/min   GFR calc Af Amer >60 >60 mL/min    Comment: (NOTE) The eGFR has been calculated using the CKD EPI equation. This calculation has not been validated in all clinical situations. eGFR's persistently <60 mL/min signify possible Chronic Kidney Disease.   Glucose, capillary     Status: Abnormal   Collection Time: 08/27/15  7:59 PM  Result Value Ref Range   Glucose-Capillary 206 (H) 65 -  99 mg/dL  Glucose, capillary     Status: Abnormal   Collection Time: 08/28/15 12:11 AM  Result Value Ref Range   Glucose-Capillary 238 (H) 65 - 99 mg/dL  Glucose, capillary     Status: Abnormal   Collection Time: 08/28/15  4:03 AM  Result Value Ref Range   Glucose-Capillary 216 (H) 65 - 99 mg/dL  CBC WITH DIFFERENTIAL     Status: Abnormal   Collection Time: 08/28/15  5:19 AM  Result Value Ref Range   WBC 12.6 (H) 4.0 - 10.5 K/uL   RBC 3.95 3.87 - 5.11 MIL/uL   Hemoglobin 10.7 (L) 12.0 - 15.0 g/dL   HCT 33.5 (L) 36.0 - 46.0 %   MCV 84.8 78.0 - 100.0 fL   MCH 27.1 26.0 - 34.0 pg   MCHC 31.9 30.0 - 36.0 g/dL   RDW 15.0 11.5 - 15.5 %   Platelets 453 (H) 150 - 400 K/uL   Neutrophils Relative % 86 %   Neutro Abs 10.9 (H) 1.7 - 7.7 K/uL   Lymphocytes Relative 6 %   Lymphs Abs 0.8 0.7 - 4.0 K/uL   Monocytes Relative 8 %   Monocytes Absolute 0.9 0.1 - 1.0 K/uL   Eosinophils Relative 0 %   Eosinophils Absolute 0.0 0.0 - 0.7 K/uL   Basophils Relative 0 %   Basophils Absolute 0.0 0.0 - 0.1 K/uL  Glucose, capillary      Status: Abnormal   Collection Time: 08/28/15  7:55 AM  Result Value Ref Range   Glucose-Capillary 189 (H) 65 - 99 mg/dL    Radiology/Results: No results found.  Anti-infectives: Anti-infectives    Start     Dose/Rate Route Frequency Ordered Stop   08/27/15 1006  cefOXitin (MEFOXIN) 2 g in dextrose 5 % 50 mL IVPB     2 g 100 mL/hr over 30 Minutes Intravenous On call to O.R. 08/27/15 1006 08/27/15 1510      Assessment/Plan: Problem List: Patient Active Problem List   Diagnosis Date Noted  . Morbid obesity ([place]) 08/27/2015    Will begin PD 1 bariatric diet.   1 Day Post-Op    LOS: 1 day   Matt B. Hassell Done, MD, [facility] [phone] beeper [phone]  08/28/2015 9:37 AM

## 2015-08-28 NOTE — Plan of Care (Signed)
Problem: Food- and Nutrition-Related Knowledge Deficit (NB-1.1) Goal: Nutrition education Formal process to instruct or train a patient/client in a skill or to impart knowledge to help patients/clients voluntarily manage or modify food choices and eating behavior to maintain or improve health. Outcome: Completed/Met Date Met:  08/28/15 Nutrition Education Note  Received consult for diet education per DROP protocol.   Discussed 2 week post op diet with pt. Emphasized that liquids must be non carbonated, non caffeinated, and sugar free. Fluid goals discussed. Pt to follow up with outpatient bariatric RD for further diet progression after 2 weeks. Multivitamins and minerals also reviewed. Teach back method used, pt expressed understanding, expect good compliance.   Diet: First 2 Weeks  You will see the nutritionist about two (2) weeks after your surgery. The nutritionist will increase the types of foods you can eat if you are handling liquids well:  If you have severe vomiting or nausea and cannot handle clear liquids lasting longer than 1 day, call your surgeon  Protein Shake  Drink at least 2 ounces of shake 5-6 times per day  Each serving of protein shakes (usually 8 - 12 ounces) should have a minimum of:  15 grams of protein  And no more than 5 grams of carbohydrate  Goal for protein each day:  Men = 80 grams per day  Women = 60 grams per day  Protein powder may be added to fluids such as non-fat milk or Lactaid milk or Soy milk (limit to 35 grams added protein powder per serving)   Hydration  Slowly increase the amount of water and other clear liquids as tolerated (See Acceptable Fluids)  Slowly increase the amount of protein shake as tolerated  Sip fluids slowly and throughout the day  May use sugar substitutes in small amounts (no more than 6 - 8 packets per day; i.e. Splenda)   Fluid Goal  The first goal is to drink at least 8 ounces of protein shake/drink per day (or as directed  by the nutritionist); some examples of protein shakes are [employer], [employer], EAS Edge HP, and Unjury. See handout from pre-op Bariatric Education Class:  Slowly increase the amount of protein shake you drink as tolerated  You may find it easier to slowly sip shakes throughout the day  It is important to get your proteins in first  Your fluid goal is to drink 64 - 100 ounces of fluid daily  It may take a few weeks to build up to this  32 oz (or more) should be clear liquids  And  32 oz (or more) should be full liquids (see below for examples)  Liquids should not contain sugar, caffeine, or carbonation   Clear Liquids:  Water or Sugar-free flavored water (i.e. Fruit H2O, Propel)  Decaffeinated coffee or tea (sugar-free)  Crystal Lite, Wyler's Lite, Minute Maid Lite  Sugar-free Jell-O  Bouillon or broth  Sugar-free Popsicle: *Less than 20 calories each; Limit 1 per day   Full Liquids:  Protein Shakes/Drinks + 2 choices per day of other full liquids  Full liquids must be:  No More Than 12 grams of Carbs per serving  No More Than 3 grams of Fat per serving  Strained low-fat cream soup  Non-Fat milk  Fat-free Lactaid Milk  Sugar-free yogurt (Dannon Lite & Fit, Greek yogurt)     Clayton Bibles, MS, RD, LDN Pager: [phone] After Hours Pager: [phone]

## 2015-08-28 NOTE — Progress Notes (Signed)
Patient alert and oriented, Post op day 1.  Provided support and encouragement.  Encouraged pulmonary toilet, ambulation and small sips of liquids.  All questions answered.  Will continue to monitor. 

## 2015-08-28 NOTE — Progress Notes (Signed)
*  PRELIMINARY RESULTS* Vascular Ultrasound Lower extremity venous duplex has been completed.  Preliminary findings: No evidence of DVT in visualized veins or baker's cyst.   Landry Mellow, RDMS, RVT  08/28/2015, 8:51 AM

## 2015-08-28 NOTE — Care Management Note (Signed)
Case Management Note  Patient Details  Name: Carlita Whitcomb MRN: [id number] Date of Birth: November 15, 1980  Subjective/Objective:            Gastric sleeve        Action/Plan: Date: August 28, 2015 Chart reviewed for concurrent status and case management needs. Will continue to follow patient for changes and needs: Velva Harman, RN, BSN, [place]   [phone]   Expected Discharge Date:                  Expected Discharge Plan:  Home/Self Care  In-House Referral:  NA  Discharge planning Services  CM Consult  Post Acute Care Choice:  NA Choice offered to:  NA  DME Arranged:    DME Agency:     HH Arranged:    [place] Agency:     Status of Service:  Completed, signed off  Medicare Important Message Given:    Date Medicare IM Given:    Medicare IM give by:    Date Additional Medicare IM Given:    Additional Medicare Important Message give by:     If discussed at [place] of Stay Meetings, dates discussed:    Additional Comments:  Leeroy Cha, RN 08/28/2015, 10:10 AM

## 2015-08-29 LAB — CBC WITH DIFFERENTIAL/PLATELET
Basophils Absolute: 0 10*3/uL (ref 0.0–0.1)
Basophils Relative: 0 %
EOS ABS: 0 10*3/uL (ref 0.0–0.7)
EOS PCT: 0 %
HCT: 32.9 % — ABNORMAL LOW (ref 36.0–46.0)
Hemoglobin: 10.4 g/dL — ABNORMAL LOW (ref 12.0–15.0)
LYMPHS ABS: 1.3 10*3/uL (ref 0.7–4.0)
Lymphocytes Relative: 10 %
MCH: 27.2 pg (ref 26.0–34.0)
MCHC: 31.6 g/dL (ref 30.0–36.0)
MCV: 86.1 fL (ref 78.0–100.0)
MONO ABS: 1.2 10*3/uL — AB (ref 0.1–1.0)
MONOS PCT: 10 %
Neutro Abs: 10.3 10*3/uL — ABNORMAL HIGH (ref 1.7–7.7)
Neutrophils Relative %: 80 %
PLATELETS: 413 10*3/uL — AB (ref 150–400)
RBC: 3.82 MIL/uL — ABNORMAL LOW (ref 3.87–5.11)
RDW: 15.3 % (ref 11.5–15.5)
WBC: 12.8 10*3/uL — ABNORMAL HIGH (ref 4.0–10.5)

## 2015-08-29 LAB — GLUCOSE, CAPILLARY
GLUCOSE-CAPILLARY: 166 mg/dL — AB (ref 65–99)
GLUCOSE-CAPILLARY: 168 mg/dL — AB (ref 65–99)
GLUCOSE-CAPILLARY: 183 mg/dL — AB (ref 65–99)
Glucose-Capillary: 170 mg/dL — ABNORMAL HIGH (ref 65–99)

## 2015-08-29 MED ORDER — ALBUTEROL SULFATE (2.5 MG/3ML) 0.083% IN NEBU
2.5000 mg | INHALATION_SOLUTION | Freq: Four times a day (QID) | RESPIRATORY_TRACT | Status: DC | PRN
  Administered 2015-08-29: 2.5 mg via RESPIRATORY_TRACT
  Filled 2015-08-29: qty 3

## 2015-08-29 MED ORDER — ONDANSETRON 4 MG PO TBDP: 4.0000 mg | ORAL_TABLET | Freq: Once | ORAL | Status: DC

## 2015-08-29 MED ORDER — ENOXAPARIN SODIUM 40 MG/0.4ML ~~LOC~~ SOLN
40.0000 mg | SUBCUTANEOUS | Status: DC
  Administered 2015-08-29: 40 mg via SUBCUTANEOUS
  Filled 2015-08-29 (×2): qty 0.4

## 2015-08-29 MED ORDER — ENOXAPARIN SODIUM 40 MG/0.4ML ~~LOC~~ SOLN: 40.0000 mg | SUBCUTANEOUS | Status: DC

## 2015-08-29 MED ORDER — ENOXAPARIN (LOVENOX) PATIENT EDUCATION KIT
PACK | Freq: Once | Status: AC
  Administered 2015-08-29: 12:00:00
  Filled 2015-08-29: qty 1

## 2015-08-29 NOTE — Discharge Summary (Signed)
Physician Discharge Summary  Patient ID: Mindy Reed MRN: [id number] DOB/AGE: [birth year] 35 y.o.  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  DVT study negative.  Diet advanced.  Ready to be discharged on Lovenox prophylaxis  Consults: pharmacy  Significant Diagnostic Studies: doppler of lower extremity : No evidence of deep vein thrombosis involving the visualized  veins of the right lower extremity and left lower extremity. - No evidence of Baker&'s cyst on the right or left.    Discharge Exam: Blood pressure 156/74, pulse 63, temperature 98 F (36.7 C), temperature source Oral, resp. rate 20, height 5\' 7"  (1.702 m), weight 202.531 kg (446 lb 8 oz), last menstrual period 08/27/2015, SpO2 98 %. Incisions OK.  Stable  Disposition: 01-Home or Self Care  Discharge Instructions    Ambulate hourly while awake    Complete by:  As directed      Call MD for:  difficulty breathing, headache or visual disturbances    Complete by:  As directed      Call MD for:  persistant dizziness or light-headedness    Complete by:  As directed      Call MD for:  persistant nausea and vomiting    Complete by:  As directed      Call MD for:  redness, tenderness, or signs of infection (pain, swelling, redness, odor or green/yellow discharge around incision site)    Complete by:  As directed      Call MD for:  severe uncontrolled pain    Complete by:  As directed      Call MD for:  temperature >101 F    Complete by:  As directed      Diet bariatric full liquid    Complete by:  As directed      Incentive spirometry    Complete by:  As directed   Perform hourly while awake            Medication List    TAKE these medications        aspirin EC 81 MG tablet  Take 81 mg by mouth daily.  Notes to Patient:  Avoid NSAIDs for 6-8 weeks after surgery      Biotin 5000 MCG Caps  Take 5,000 mcg by mouth daily.     escitalopram 10 MG tablet  Commonly known as:  LEXAPRO  Take 10 mg by mouth daily.     ferrous sulfate 325 (65 FE) MG tablet  Take 325 mg by mouth 2 (two) times daily with a meal.     fexofenadine 180 MG tablet  Commonly known as:  ALLEGRA  Take 180 mg by mouth daily.     fluticasone 50 MCG/ACT nasal spray  Commonly known as:  FLONASE  Place 2 sprays into the nose at bedtime.     glimepiride 4 MG tablet  Commonly known as:  AMARYL  Take 4 mg by mouth daily with breakfast.  Notes to Patient:  Monitor Blood Sugar Frequently and keep a log for primary care physician, you may need to adjust medication dosage with rapid weight loss.        hydrochlorothiazide 25 MG tablet  Commonly  known as:  HYDRODIURIL  Take 25 mg by mouth daily.  Notes to Patient:  Monitor Blood Pressure Daily and keep a log for primary care physician.  Monitor for symptoms of dehydration.  You may need to make changes to your medications with rapid weight loss.       insulin glargine 100 UNIT/ML injection  Commonly known as:  LANTUS  Inject 45 Units into the skin at bedtime.  Notes to Patient:  Monitor Blood Sugar Frequently and keep a log for primary care physician, you may need to adjust medication dosage with rapid weight loss.        metFORMIN 1000 MG tablet  Commonly known as:  GLUCOPHAGE  Take 1,000 mg by mouth 2 (two) times daily with a meal.  Notes to Patient:  Monitor Blood Sugar Frequently and keep a log for primary care physician, you may need to adjust medication dosage with rapid weight loss.        MULTIVITAMIN & MINERAL PO  Take 1 tablet by mouth daily.     ondansetron 4 MG disintegrating tablet  Commonly known as:  ZOFRAN-ODT  Take 1 tablet (4 mg total) by  mouth once.     SUDAFED PO  Take 1 tablet by mouth daily.     Vitamin B-12 5000 MCG Subl  Place 5,000 tablets under the tongue daily.           Follow-up Information    Follow up with Pedro Earls, MD. Go on 09/06/2015.   Specialty:  General Surgery   Why:  For Post-Op Check at 10:45 AM   Contact information:   [address] [place] [address] [phone]       Signed: Pedro Earls 08/29/2015, 11:31 AM

## 2015-08-29 NOTE — Progress Notes (Signed)
Patient alert and oriented, pain is controlled. Patient is tolerating fluids, advanced to protein shake today, patient is tolerating well. Reviewed Gastric Bypass discharge instructions with patient and patient is able to articulate understanding. Provided information on BELT program, Support Group and [facility] outpatient pharmacy. All questions answered, will continue to monitor.    

## 2015-08-29 NOTE — Discharge Instructions (Signed)

## 2015-08-29 NOTE — Progress Notes (Signed)
ANTICOAGULATION CONSULT NOTE - Initial Consult  Pharmacy Consult for Enoxaparin Indication: VTE prophylaxis  Allergies  Allergen Reactions  . Morphine And Related Hives    Patient Measurements: Height: 5\' 7"  (170.2 cm) Weight: (!) 446 lb 8 oz (202.531 kg) (Accurate weight) IBW/kg (Calculated) : 61.6  Vital Signs: Temp: 98 F (36.7 C) (11/09 0949) Temp Source: Oral (11/09 0949) BP: 156/74 mmHg (11/09 0949) Pulse Rate: 63 (11/09 0949)  Labs:  Recent Labs  08/27/15 1840 08/28/15 0519 08/28/15 1613 08/29/15 0429  HGB 11.4* 10.7* 10.4* 10.4*  HCT 36.1 33.5* 33.2* 32.9*  PLT 419* 453*  --  413*  CREATININE 0.84  --   --   --     Estimated Creatinine Clearance: 175.8 mL/min (by C-G formula based on Cr of 0.84).   Medical History: Past Medical History  Diagnosis Date  . Hypertension   . Asthma     seasonal  . Diabetes mellitus without complication ([place])     Type 2   . Depression   . Anxiety   . GERD (gastroesophageal reflux disease)     no meds currently  . Vaginal delivery 2015  . 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 Patient with super morbid obesity, BMI > 69. Patient received heparin SQ q8h for VTE prophylaxis post-op along with SCDs. Bilateral lower extremity venous duplex negative for DVT. Pharmacy asked to assist with dosing of Lovenox for VTE prophylaxis at discharge.   Today, 08/29/2015:  SCr 0.84 (from 11/7) with CrCl > 100 ml/min  CBC: Hgb 10.4, stable; Pltc 413  Last SQ heparin dose 11/9 at 0511  Goal of Therapy:  Absence of VTE Absence of bleeding   Plan:   Discontinue SQ heparin.  Would recommend at least lovenox 40 mg SQ q24h to start at 1400 today. Can increase to 40 mg q12h if MD deems patient's DVT risk is high enough.  Mointor for s/s of bleeding.    Lindell Spar, PharmD, BCPS Pager:  [phone] 08/29/2015 12:48 PM

## 2015-09-03 ENCOUNTER — Telehealth (HOSPITAL_COMMUNITY): Payer: Self-pay

## 2015-09-03 NOTE — Telephone Encounter (Signed)
Made discharge phone call to patient per DROP protocol. Asking the following questions.    1. Do you have someone to care for you now that you are home?  yes 2. Are you having pain now that is not relieved by your pain medication?  no 3. Are you able to drink the recommended daily amount of fluids (48 ounces minimum/day) and protein (60-80 grams/day) as prescribed by the dietitian or nutritional counselor?  yes 4. Are you taking the vitamins and minerals as prescribed?  yes 5. Do you have the "on call" number to contact your surgeon if you have a problem or question?  yes 6. Are your incisions free of redness, swelling or drainage? (If steri strips, address that these can fall off, shower as tolerated) 2 have a little bit of drainage, drops.but ok, serosanguineous  7. Have your bowels moved since your surgery?  If not, are you passing gas?  yes 8. Are you up and walking 3-4 times per day?  yes

## 2015-09-11 ENCOUNTER — Encounter: Payer: BC Managed Care – PPO | Attending: Surgery

## 2015-09-11 DIAGNOSIS — Z713 Dietary counseling and surveillance: Secondary | ICD-10-CM | POA: Diagnosis not present

## 2015-09-11 DIAGNOSIS — Z6841 Body Mass Index (BMI) 40.0 and over, adult: Secondary | ICD-10-CM | POA: Insufficient documentation

## 2015-09-12 NOTE — Progress Notes (Signed)
Bariatric Class:  Appt start time: 1530 end time:  1630.  2 Week Post-Operative Nutrition Class  Patient was seen on 09/11/2015 for Post[facility]ative Nutrition [facility].   Surgery date: 08/27/15 Surgery type: RYGB [facility] at [facility]: 444 lbs on 06/30/15 Weight today: 417.0 lbs  Weight change: 29.2 lbs  TANITA  BODY COMP RESULTS  07/16/15 09/11/15   BMI (kg/m^2) N/A 65.3   Fat Mass (lbs)  251.5   Fat Free Mass (lbs)  165.5   Total Body Water (lbs)  121.0     The following the learning objectives were met by the patient during this course:  Identifies Phase 3A (Soft, High Proteins) Dietary Goals and will begin from 2 weeks post-operatively to 2 months post-operatively  Identifies appropriate sources of fluids and proteins   States protein recommendations and appropriate sources post-operatively  Identifies the need for appropriate texture modifications, mastication, and bite sizes when consuming solids  Identifies appropriate multivitamin and calcium sources post-operatively  Describes the need for physical activity post-operatively and will follow MD recommendations  States when to call healthcare provider regarding medication questions or post-operative complications  Handouts given during class include:  Phase 3A: Soft, High Protein Diet Handout  Follow-Up Plan: Patient will follow-up at [facility] in 6 weeks for 2 month post-op nutrition visit for diet advancement per MD.

## 2015-09-22 ENCOUNTER — Encounter (HOSPITAL_COMMUNITY): Payer: Self-pay | Admitting: Emergency Medicine

## 2015-09-22 ENCOUNTER — Emergency Department (HOSPITAL_COMMUNITY)
Admission: EM | Admit: 2015-09-22 | Discharge: 2015-09-22 | Disposition: A | Discharge status: Home / Self Care | Payer: BC Managed Care – PPO | Attending: Emergency Medicine | Admitting: Emergency Medicine

## 2015-09-22 DIAGNOSIS — F329 Major depressive disorder, single episode, unspecified: Secondary | ICD-10-CM | POA: Insufficient documentation

## 2015-09-22 DIAGNOSIS — F419 Anxiety disorder, unspecified: Secondary | ICD-10-CM | POA: Diagnosis not present

## 2015-09-22 DIAGNOSIS — R1031 Right lower quadrant pain: Secondary | ICD-10-CM | POA: Diagnosis present

## 2015-09-22 DIAGNOSIS — R109 Unspecified abdominal pain: Secondary | ICD-10-CM | POA: Insufficient documentation

## 2015-09-22 DIAGNOSIS — Z9049 Acquired absence of other specified parts of digestive tract: Secondary | ICD-10-CM | POA: Insufficient documentation

## 2015-09-22 DIAGNOSIS — Z79899 Other long term (current) drug therapy: Secondary | ICD-10-CM | POA: Diagnosis not present

## 2015-09-22 DIAGNOSIS — R42 Dizziness and giddiness: Secondary | ICD-10-CM | POA: Insufficient documentation

## 2015-09-22 DIAGNOSIS — E119 Type 2 diabetes mellitus without complications: Secondary | ICD-10-CM | POA: Insufficient documentation

## 2015-09-22 DIAGNOSIS — J45909 Unspecified asthma, uncomplicated: Secondary | ICD-10-CM | POA: Diagnosis not present

## 2015-09-22 DIAGNOSIS — I10 Essential (primary) hypertension: Secondary | ICD-10-CM | POA: Insufficient documentation

## 2015-09-22 DIAGNOSIS — Z8701 Personal history of pneumonia (recurrent): Secondary | ICD-10-CM | POA: Insufficient documentation

## 2015-09-22 DIAGNOSIS — Z794 Long term (current) use of insulin: Secondary | ICD-10-CM | POA: Diagnosis not present

## 2015-09-22 DIAGNOSIS — Z9889 Other specified postprocedural states: Secondary | ICD-10-CM | POA: Diagnosis not present

## 2015-09-22 DIAGNOSIS — R51 Headache: Secondary | ICD-10-CM | POA: Insufficient documentation

## 2015-09-22 LAB — COMPREHENSIVE METABOLIC PANEL
ALK PHOS: 52 U/L (ref 38–126)
ALT: 40 U/L (ref 14–54)
AST: 33 U/L (ref 15–41)
Albumin: 3.8 g/dL (ref 3.5–5.0)
Anion gap: 11 (ref 5–15)
BILIRUBIN TOTAL: 1 mg/dL (ref 0.3–1.2)
BUN: 9 mg/dL (ref 6–20)
CALCIUM: 9.3 mg/dL (ref 8.9–10.3)
CO2: 26 mmol/L (ref 22–32)
CREATININE: 0.72 mg/dL (ref 0.44–1.00)
Chloride: 103 mmol/L (ref 101–111)
Glucose, Bld: 127 mg/dL — ABNORMAL HIGH (ref 65–99)
Potassium: 3.7 mmol/L (ref 3.5–5.1)
Sodium: 140 mmol/L (ref 135–145)
TOTAL PROTEIN: 7.6 g/dL (ref 6.5–8.1)

## 2015-09-22 LAB — CBC WITH DIFFERENTIAL/PLATELET
BASOS ABS: 0 10*3/uL (ref 0.0–0.1)
Basophils Relative: 0 %
EOS ABS: 0.2 10*3/uL (ref 0.0–0.7)
Eosinophils Relative: 2 %
HCT: 39.1 % (ref 36.0–46.0)
HEMOGLOBIN: 12.3 g/dL (ref 12.0–15.0)
LYMPHS ABS: 1.9 10*3/uL (ref 0.7–4.0)
LYMPHS PCT: 20 %
MCH: 26.1 pg (ref 26.0–34.0)
MCHC: 31.5 g/dL (ref 30.0–36.0)
MCV: 82.8 fL (ref 78.0–100.0)
Monocytes Absolute: 0.8 10*3/uL (ref 0.1–1.0)
Monocytes Relative: 8 %
NEUTROS PCT: 70 %
Neutro Abs: 6.7 10*3/uL (ref 1.7–7.7)
Platelets: 534 10*3/uL — ABNORMAL HIGH (ref 150–400)
RBC: 4.72 MIL/uL (ref 3.87–5.11)
RDW: 15.1 % (ref 11.5–15.5)
WBC: 9.6 10*3/uL (ref 4.0–10.5)

## 2015-09-22 LAB — LIPASE, BLOOD: LIPASE: 31 U/L (ref 11–51)

## 2015-09-22 MED ORDER — SODIUM CHLORIDE 0.9 % IV BOLUS (SEPSIS)
1000.0000 mL | Freq: Once | INTRAVENOUS | Status: AC
  Administered 2015-09-22: 1000 mL via INTRAVENOUS

## 2015-09-22 MED ORDER — SODIUM CHLORIDE 0.9 % IV SOLN
INTRAVENOUS | Status: DC
  Administered 2015-09-22: 19:00:00 via INTRAVENOUS

## 2015-09-22 MED ORDER — ONDANSETRON HCL 4 MG/2ML IJ SOLN
4.0000 mg | Freq: Once | INTRAMUSCULAR | Status: AC
  Administered 2015-09-22: 4 mg via INTRAVENOUS
  Filled 2015-09-22: qty 2

## 2015-09-22 NOTE — ED Provider Notes (Signed)
CSN: [id number]     Arrival date & time 09/22/15  1702 History   First MD Initiated Contact with Patient 09/22/15 1822     Chief Complaint  Patient presents with  . Abdominal Pain    RLQ  . Dizziness     (Consider location/radiation/quality/duration/timing/severity/associated sxs/prior Treatment) HPI Comments: Patient here complaining of sudden onset of dizziness and mild headache prior to arrival. Has had some intermittent sharp right lower quadrant abdominal pain which last for couple seconds without associated fever or chills. Denies any urinary symptoms. Her dizziness has subsided nothing made it better or worse. No associated headache. No recent medication changes. Feels better at this time  Patient is a 34 y.o. female presenting with abdominal pain and dizziness. The history is provided by the patient.  Abdominal Pain Dizziness   Past Medical History  Diagnosis Date  . Hypertension   . Asthma     seasonal  . Diabetes mellitus without complication ([place])     Type 2   . Depression   . Anxiety   . GERD (gastroesophageal reflux disease)     no meds currently  . Vaginal delivery 2015  . Pneumonia    Past Surgical History  Procedure Laterality Date  . Cholecystectomy    . Tonsillectomy    . Ovarian cyst removal    . Ankle surgery    . Hysteroscopy w/d&c N/A 07/18/2015    Procedure: DILATATION AND CURETTAGE /HYSTEROSCOPY;  Surgeon: Everlene Farrier, MD;  Location: [facility];  Service: Gynecology;  Laterality: N/A;  . Intrauterine device (iud) insertion N/A 07/18/2015    Procedure: INTRAUTERINE DEVICE (IUD) INSERTION;  Surgeon: Everlene Farrier, MD;  Location: [facility];  Service: Gynecology;  Laterality: N/A;  . Laparoscopic roux-en-y gastric bypass with hiatal hernia repair  08/27/2015    Procedure: LAPAROSCOPIC ROUX-EN-Y GASTRIC BYPASS WITH HIATAL HERNIA REPAIR;  Surgeon: Johnathan Hausen, MD;  Location: [facility];  Service: General;;  . Upper gi endoscopy  08/27/2015    Procedure: UPPER GI  ENDOSCOPY;  Surgeon: Johnathan Hausen, MD;  Location: [facility];  Service: General;;   Family History  Problem Relation Age of Onset  . Osteoarthritis Mother   . Diabetes Mother   . Hypertension Mother   . Migraines Mother   . Osteoarthritis Father   . Diabetes Father   . Heart failure Father   . Hypertension Father    Social History  Substance Use Topics  . Smoking status: Never Smoker   . Smokeless tobacco: None  . Alcohol Use: No   OB History    No data available     Review of Systems  Gastrointestinal: Positive for abdominal pain.  Neurological: Positive for dizziness.  All other systems reviewed and are negative.     Allergies  Morphine and related; Aspirin; and Nsaids  Home Medications   Prior to Admission medications   Medication Sig Start Date End Date Taking? Authorizing Provider  Biotin 5000 MCG CAPS Take 5,000 mcg by mouth daily.    Yes Historical Provider, MD  calcium gluconate 500 MG tablet Take 500 mg by mouth 3 (three) times daily.    Yes Historical Provider, MD  Cyanocobalamin (VITAMIN B-12) 5000 MCG SUBL Place 5,000 mcg under the tongue daily.    Yes Historical Provider, MD  escitalopram (LEXAPRO) 10 MG tablet Take 10 mg by mouth daily.   Yes Historical Provider, MD  ferrous sulfate 325 (65 FE) MG tablet Take 325 mg by mouth 2 (two) times daily with a  meal.   Yes Historical Provider, MD  fexofenadine (ALLEGRA) 180 MG tablet Take 180 mg by mouth daily.   Yes Historical Provider, MD  insulin glargine (LANTUS) 100 UNIT/ML injection Inject 12 Units into the skin at bedtime.    Yes Historical Provider, MD  Multiple Vitamins-Minerals (MULTIVITAMIN & MINERAL PO) Take 1 tablet by mouth 2 (two) times daily.    Yes Historical Provider, MD  ondansetron (ZOFRAN-ODT) 4 MG disintegrating tablet Take 1 tablet (4 mg total) by mouth once. 08/29/15  Yes Johnathan Hausen, MD  VENTOLIN HFA 108 (90 BASE) MCG/ACT inhaler Inhale 1 puff into the lungs every 6 (six) hours as needed for  wheezing or shortness of breath.  08/30/15  Yes Historical Provider, MD  enoxaparin (LOVENOX) 40 MG/0.4ML injection Inject 0.4 mLs (40 mg total) into the skin daily. Patient not taking: Reported on 09/22/2015 08/29/15   Johnathan Hausen, MD   BP 127/87 mmHg  Pulse 73  Temp(Src) 97.8 F (36.6 C) (Oral)  Resp 20  SpO2 97%  LMP 07/19/2015 (Exact Date) Physical Exam  Constitutional: She is oriented to person, place, and time. She appears well-developed and well-nourished.  Non-toxic appearance. No distress.  HENT:  Head: Normocephalic and atraumatic.  Eyes: Conjunctivae, EOM and lids are normal. Pupils are equal, round, and reactive to light.  Neck: Normal range of motion. Neck supple. No tracheal deviation present. No thyroid mass present.  Cardiovascular: Normal rate, regular rhythm and normal heart sounds.  Exam reveals no gallop.   No murmur heard. Pulmonary/Chest: Effort normal and breath sounds normal. No stridor. No respiratory distress. She has no decreased breath sounds. She has no wheezes. She has no rhonchi. She has no rales.  Abdominal: Soft. Normal appearance and bowel sounds are normal. She exhibits no distension. There is no tenderness. There is no rebound and no CVA tenderness.  Musculoskeletal: Normal range of motion. She exhibits no edema or tenderness.  Neurological: She is alert and oriented to person, place, and time. She has normal strength. No cranial nerve deficit or sensory deficit. GCS eye subscore is 4. GCS verbal subscore is 5. GCS motor subscore is 6.  Skin: Skin is warm and dry. No abrasion and no rash noted.  Psychiatric: She has a normal mood and affect. Her speech is normal and behavior is normal.  Nursing note and vitals reviewed.   ED Course  Procedures (including critical care time) Labs Review Labs Reviewed  CBC WITH DIFFERENTIAL/PLATELET  COMPREHENSIVE METABOLIC PANEL  LIPASE, BLOOD    Imaging Review No results found. I have personally reviewed and  evaluated these images and lab results as part of my medical decision-making.   EKG Interpretation None      MDM   Final diagnoses:  None    Patient's labs are reassuring abdominal exam is benign. Will be discharged home    Lacretia Leigh, MD 09/22/15 2047

## 2015-09-22 NOTE — Discharge Instructions (Signed)

## 2015-09-22 NOTE — ED Notes (Signed)
Pt c/o RLQ pain with nausea, c/o constipation. No fever. [place] and episode of dizziness.

## 2015-10-29 ENCOUNTER — Encounter: Payer: BC Managed Care – PPO | Attending: Surgery | Admitting: Dietician

## 2015-10-29 ENCOUNTER — Ambulatory Visit: Payer: BC Managed Care – PPO | Admitting: Dietician

## 2015-10-29 DIAGNOSIS — Z6841 Body Mass Index (BMI) 40.0 and over, adult: Secondary | ICD-10-CM | POA: Diagnosis not present

## 2015-10-29 DIAGNOSIS — Z713 Dietary counseling and surveillance: Secondary | ICD-10-CM | POA: Insufficient documentation

## 2015-10-29 NOTE — Progress Notes (Signed)
  Follow-up visit:  8 Weeks Post-Operative RYGB Surgery  Medical Nutrition Therapy:  Appt start time: 1100 end time:  1145  Primary concerns today: Post-operative Bariatric Surgery Nutrition Management.  Calise returns having lost another 34.5 lbs. She reports that she is only on Lantus for her diabetes. Noticed she is very sensitive to anything sweet, even sugar free drinks. Keeping a food log (pen and paper). Has pain with dry or reheated foods. Can tolerate about 1-2 oz meat at a time. Eats only at the kitchen table; working on mindful eating. Weighs herself 1x a week. She continues to see Dr. Ardath Sax for psych follow up 1x a month.   Surgery date: 08/27/15 Surgery type: RYGB Start weight at [facility]: 444 lbs on 06/30/15 (highest weight 480 lbs per patient) Weight today: 382.5 lbs Weight change: 34.5 lbs Total weight lost: 61.5 lbs (97.5 lbs)  TANITA  BODY COMP RESULTS  07/16/15 09/11/15 10/29/15   BMI (kg/m^2) N/A 65.3 59.9   Fat Mass (lbs)  251.5 227   Fat Free Mass (lbs)  165.5 155.5   Total Body Water (lbs)  121.0 114    Preferred Learning Style:   No preference indicated   Learning Readiness:   Ready  24-hr recall: B (6:30 AM): 1/2 Premier protein drink OR cup of FairLife milk, sometimes with protein powder (15g) Snk (9-9:30AM): 2% cheese or Quest protein chips or [place] yogurt, sometimes with protein powder (7-10g)   L (12-12:30PM): 1/4-3/4 cup chili or [place] sausage or deli meat (7-10g) Snk (3-3:30PM): cheese (7g)  D (6-6:30PM): 1-2 oz chicken or chili (7-14g) Snk (PM):   Fluid intake: FairLife milk, protein shake, water with sugar free flavoring, Powerade Zero, sugar free vitamin water (80-100 oz per day per patient) Estimated total protein intake: 60-80 grams per day per patient  Medications: see list Supplementation: taking  CBG monitoring: am and 1-2x throughout the day Average CBG per patient: 110-120 mg/dL fasting Last patient reported A1c: none since surgery    Using straws: no Drinking while eating: "working on it" Hair loss: none Carbonated beverages: none N/V/D/C: nausea, no vomiting; constipation  Dumping syndrome: migraines and diarrhea from accidentally eating sugar  Recent physical activity:  Mostly walking; has not gone to the gym yet but joined a gym. Looking forward to weight lifting.  Progress Towards Goal(s):  In progress.  Handouts given during visit include:  Phase 3B lean protein + non starchy vegetables   Nutritional Diagnosis:  [place]-3.3 Overweight/obesity related to past poor dietary habits and physical inactivity as evidenced by patient w/ recent RYGB surgery following dietary guidelines for continued weight loss.     Intervention:  Nutrition counseling provided.  Teaching Method Utilized:  Visual Auditory Hands on  Barriers to learning/adherence to lifestyle change: none  Demonstrated degree of understanding via:  Teach Back   Monitoring/Evaluation:  Dietary intake, exercise, and body weight. Follow up in 1 months for 3 month post-op visit.

## 2015-10-29 NOTE — Patient Instructions (Signed)
Goals:  Follow Phase 3B: High Protein + Non-Starchy Vegetables  Eat 3-6 small meals/snacks, every 3-5 hrs  Increase lean protein foods to meet 60g goal  Increase fluid intake to 64oz +  Avoid drinking 15 minutes before, during and 30 minutes after eating  Aim for >30 min of physical activity daily  Surgery date: 08/27/15 Surgery type: RYGB Start weight at [facility]: 444 lbs on 06/30/15 (highest weight 480 lbs per patient) Weight today: 382.5 lbs Weight change: 34.5 lbs Total weight lost: 61.5 lbs (97.5 lbs)  TANITA  BODY COMP RESULTS  07/16/15 09/11/15 10/29/15   BMI (kg/m^2) N/A 65.3 59.9   Fat Mass (lbs)  251.5 227   Fat Free Mass (lbs)  165.5 155.5   Total Body Water (lbs)  121.0 114

## 2015-10-30 ENCOUNTER — Encounter: Payer: Self-pay | Admitting: Dietician

## 2015-11-05 ENCOUNTER — Telehealth: Payer: Self-pay | Admitting: Surgery

## 2015-11-05 NOTE — Telephone Encounter (Signed)
Mindy Reed had a RYGB by Dr. Hassell Done 08/26/2016.  She has done well and has lost about 60 pounds since surgery.  She has had nausea and vomiting today at least a couple of times.  By the time I got her on the phone, she was feeling better.  She is having no fever and having no abdominal pain.  I told her to stay on liquids for now and check with our office in the AM.  If she gets worse, she may need to come to the ER.  Alphonsa Overall, MD, [facility] Surgery Pager: [phone] Office phone:  [phone]

## 2015-12-07 ENCOUNTER — Encounter: Payer: Self-pay | Admitting: Dietician

## 2015-12-07 ENCOUNTER — Encounter: Payer: BC Managed Care – PPO | Attending: Surgery | Admitting: Dietician

## 2015-12-07 DIAGNOSIS — Z6841 Body Mass Index (BMI) 40.0 and over, adult: Secondary | ICD-10-CM | POA: Insufficient documentation

## 2015-12-07 DIAGNOSIS — Z713 Dietary counseling and surveillance: Secondary | ICD-10-CM | POA: Insufficient documentation

## 2015-12-07 NOTE — Progress Notes (Signed)
  Follow-up visit:  3.5 months Post-Operative RYGB Surgery  Medical Nutrition Therapy:  Appt start time: 840 end time:  915  Primary concerns today: Post-operative Bariatric Surgery Nutrition Management.  Mindy Reed returns having lost another 16 lbs. She reports that sometimes chicken and seafood make her sick. Has had some vomiting and has discussed this with Mindy Reed (PA from [facility]). She has been requested to resume liquids for a day or two after vomiting. Still focused on protein and does not have a lot of vegetables. Still on Lantus for diabetes. Cannot drink very cold drinks or plain water. Focusing on eating mindfully.   Surgery date: 08/27/15 Surgery type: RYGB Start weight at [facility]: 444 lbs on 06/30/15 (highest weight 480 lbs per patient) Weight today: 366.5 lbs Weight change: 16 lbs Total weight lost: 77.5 lbs (113.5 lbs)  TANITA  BODY COMP RESULTS  07/16/15 09/11/15 10/29/15 12/07/15   BMI (kg/m^2) N/A 65.3 59.9 57.4   Fat Mass (lbs)  251.5 227 205.5   Fat Free Mass (lbs)  165.5 155.5 161   Total Body Water (lbs)  121.0 114 118    Preferred Learning Style:   No preference indicated   Learning Readiness:   Ready  24-hr recall: B (6:30 AM): 1/2 Premier protein drink OR Atkins Lift (15-20g) Snk (9-9:30AM): 1/4 cup cottage cheese with vegetable  L (12-12:30PM): 1-1.5 oz steak and chicken with vegetable (10g) Snk (3-3:30PM): sometimes cottage cheese or cheese if she has an early lunch  D (6-6:30PM): taco seasoned beef with refried beans and cheese (14g) Snk (PM):   Fluid intake: FairLife milk, protein shake, water with sugar free flavoring, Powerade Zero, sugar free vitamin water (80-100 oz per day per patient) Estimated total protein intake: 60-80 grams per day per patient  Medications: see list Supplementation: taking  CBG monitoring: am and 1-2x throughout the day Average CBG per patient: 98-114 mg/dL fasting Last patient reported A1c: none since surgery;  bloodwork planned for beginning of April   Using straws: no Drinking while eating: no, drank with meal 1x and noticed that she was able to eat much more Hair loss: some thinning Carbonated beverages: none N/V/D/C: some vomiting; takes Colace daily for constipation Dumping syndrome: migraines and diarrhea from accidentally eating sugar  Recent physical activity:  Working out 3x a week most weeks for an hour (cardio and weight circuit)  Progress Towards Goal(s):  In progress.  Handouts given during visit include:  none   Nutritional Diagnosis:  [place]-3.3 Overweight/obesity related to past poor dietary habits and physical inactivity as evidenced by patient w/ recent RYGB surgery following dietary guidelines for continued weight loss.     Intervention:  Nutrition counseling provided.  Teaching Method Utilized:  Visual Auditory Hands on  Barriers to learning/adherence to lifestyle change: none  Demonstrated degree of understanding via:  Teach Back   Monitoring/Evaluation:  Dietary intake, exercise, and body weight. Follow up in 6 weeks for 5 month post-op visit.

## 2015-12-07 NOTE — Patient Instructions (Addendum)
Goals:  Follow Phase 3B: High Protein + Non-Starchy Vegetables  Eat 3-6 small meals/snacks, every 3-5 hrs  Increase lean protein foods to meet 60g goal  Increase fluid intake to 64oz +  Avoid drinking 15 minutes before, during and 30 minutes after eating  Aim for >30 min of physical activity daily   Surgery date: 08/27/15 Surgery type: RYGB Start weight at [facility]: 444 lbs on 06/30/15 (highest weight 480 lbs per patient) Weight today: 366.5 lbs Weight change: 16 lbs Total weight lost: 77.5 lbs (113.5 lbs)  TANITA  BODY COMP RESULTS  07/16/15 09/11/15 10/29/15 12/07/15   BMI (kg/m^2) N/A 65.3 59.9 57.4   Fat Mass (lbs)  251.5 227 205.5   Fat Free Mass (lbs)  165.5 155.5 161   Total Body Water (lbs)  121.0 114 118

## 2015-12-19 ENCOUNTER — Ambulatory Visit
Admission: RE | Admit: 2015-12-19 | Discharge: 2015-12-19 | Disposition: A | Discharge status: Home / Self Care | Payer: BC Managed Care – PPO | Source: Ambulatory Visit | Attending: Nurse Practitioner | Admitting: Nurse Practitioner

## 2015-12-19 ENCOUNTER — Other Ambulatory Visit: Payer: Self-pay | Admitting: Nurse Practitioner

## 2015-12-19 DIAGNOSIS — M545 Low back pain: Secondary | ICD-10-CM | POA: Diagnosis not present

## 2015-12-27 ENCOUNTER — Ambulatory Visit: Payer: BC Managed Care – PPO | Attending: Nurse Practitioner

## 2015-12-27 DIAGNOSIS — M545 Low back pain: Secondary | ICD-10-CM | POA: Diagnosis not present

## 2015-12-27 DIAGNOSIS — R531 Weakness: Secondary | ICD-10-CM | POA: Diagnosis present

## 2015-12-27 DIAGNOSIS — G8929 Other chronic pain: Secondary | ICD-10-CM | POA: Diagnosis present

## 2015-12-27 NOTE — Patient Instructions (Signed)
Pelvic Tilt: Posterior - Legs Bent (Supine)    Tighten stomach and flatten back by rolling pelvis down. Hold __5__ seconds. Relax. Repeat __10__ times per set. Do ___3_ sets per session. Do __2__ sessions per day.  http://orth.exer.us/203   Copyright  VHI. All rights reserved.     When you are sleeping on your back, prop your knees up with pillows.

## 2015-12-27 NOTE — Therapy (Signed)
[facility] [address], [place], [address] Phone: [phone]   Fax:  [phone]  Physical Therapy Evaluation  Patient Details  Name: Mindy Reed MRN: [id number] Date of Birth: Nov 07, 1980 Referring Provider: Charlott Holler, FNP  Encounter Date: 12/27/2015      PT End of Session - 12/27/15 1553    Visit Number 1   Number of Visits 9   Date for PT Re-Evaluation 01/24/16   PT Start Time [id number]  Pt arrived late and then filled out paperwork   PT Stop Time 1647   PT Time Calculation (min) 54 min   Activity Tolerance Patient tolerated treatment well   Behavior During Therapy [facility] for tasks assessed/performed      Past Medical History  Diagnosis Date  . Hypertension   . Asthma     seasonal  . Diabetes mellitus without complication ([place])     Type 2   . Depression   . Anxiety   . GERD (gastroesophageal reflux disease)     no meds currently  . Vaginal delivery 2015  . Pneumonia     Past Surgical History  Procedure Laterality Date  . Cholecystectomy    . Tonsillectomy    . Ovarian cyst removal    . Ankle surgery    . Hysteroscopy w/d&c N/A 07/18/2015    Procedure: DILATATION AND CURETTAGE /HYSTEROSCOPY;  Surgeon: Everlene Farrier, MD;  Location: [facility];  Service: Gynecology;  Laterality: N/A;  . Intrauterine device (iud) insertion N/A 07/18/2015    Procedure: INTRAUTERINE DEVICE (IUD) INSERTION;  Surgeon: Everlene Farrier, MD;  Location: [facility];  Service: Gynecology;  Laterality: N/A;  . Laparoscopic roux-en-y gastric bypass with hiatal hernia repair  08/27/2015    Procedure: LAPAROSCOPIC ROUX-EN-Y GASTRIC BYPASS WITH HIATAL HERNIA REPAIR;  Surgeon: Johnathan Hausen, MD;  Location: [facility];  Service: General;;  . Upper gi endoscopy  08/27/2015    Procedure: UPPER GI ENDOSCOPY;  Surgeon: Johnathan Hausen, MD;  Location: [facility];  Service: General;;    There were no vitals filed for this visit.  Visit Diagnosis:   Chronic low back pain - Plan: PT plan of care cert/re-cert  Weakness - Plan: PT plan of care cert/re-cert      Subjective Assessment - 12/27/15 1600    Subjective back pain: 3-4/10 currently (pt sitting), 3/10 at best (during the afternoon), 9/10 at worst (first thing in the morning). L hip pain: 0/10 currently (pt sitting), 9/10 at worst (first thing in the morning)   Pertinent History Low back and L hip pain began 2015 during her pregnancy. Did not bother her until after her gastric bypass surgery on 11/0/2016. Pt states that she was 480 lbs at her highest weight.  Pt denies bowel or bladder problems, denies saddle anesthesia symptoms. Had xrays for her back last week which revealed thinning between L1/L2 and L4/L5, no arthritis or injury detected.    Patient Stated Goals Reduce pain, improve stability, balance, learn a few tricks to help with her back, exercise.    Currently in Pain? Yes   Pain Score 4    Pain Location Back   Pain Orientation Lower   Pain Descriptors / Indicators Aching;Burning;Stabbing;Pins and needles   Pain Type Chronic pain   Pain Radiating Towards L hip   Pain Onset More than a month ago   Pain Frequency Constant   Aggravating Factors  supine position, first thing in the morning (pt on her back), standing, walking,  sitting, getting out of bed   Pain Relieving Factors tramadol, heating pad, lumbar support when sitting secondary to her job being sedentary, compression   Multiple Pain Sites Yes  Low back and L hip pain            [facility] PT Assessment - 12/27/15 1609    Assessment   Medical Diagnosis Chronic low back pain   Referring Provider Charlott Holler, FNP   Onset Date/Surgical Date 09/19/15  Date physical therapy referral signed   Prior Therapy No known physical therapy for current condition   Precautions   Precaution Comments No known precautions   Restrictions   Other Position/Activity Restrictions No known restrictions   Balance Screen   Has the  patient fallen in the past 6 months No   Has the patient had a decrease in activity level because of a fear of falling?  No   Is the patient reluctant to leave their home because of a fear of falling?  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    Observation/Other Assessments   Observations (-) Slump test   Modified [place] 36%   Posture/Postural Control   Posture Comments Bilaterally pronated feet L > R, bilateral genu recurvatum, bilateral hip adduction, bilateral genu valgus, bilaterally protracted shoulders and neck. L iliac crest slightly higher, increased lordosis around T12/L1   AROM   Lumbar Flexion full with low back pulling sensation horizontally, back discomfort on the return motion around the thoracolumbar area (increased extension at thoracolumbar area when returning to neutral)   Lumbar Extension limited, no pain   Lumbar - Right Side Bend limited with reproduction of L low back and L hip pain   Lumbar - Left Side Bend limited   Lumbar - Right Rotation WFL   Lumbar - Left Rotation WFL with increased R lateral shift, L posterior hip discomfort.    Strength   Right Hip External Rotation  4/5   Right Hip ABduction 4/5   Left Hip External Rotation 4-/5  with low back discomfort   Left Hip ABduction 3+/5   Right Knee Flexion 4+/5   Right Knee Extension 5/5   Left Knee Flexion 4+/5   Left Knee Extension 5/5   Palpation   Palpation comment TTP with P to A  to L4   Ambulation/Gait   Gait Comments R lateral lean, bilateral hip adduction and IR, increased movement at thoracolumbar junction.        Objectives  There-ex  S/L L clam shells 5x3 Supine posterior pelvic tilts 10x2 Reviewed HEP. Pt demonstrated and verbalized understanding.   Improved exercise technique, movement at target joints, use of target muscles after mod verbal,  visual, tactile cues.             PT Education - 12/27/15 1919    Education provided Yes   Education Details ther-ex, HEP   Person(s) Educated Patient   Methods Explanation;Demonstration;Tactile cues;Verbal cues;Handout   Comprehension Verbalized understanding;Returned demonstration             PT Long Term Goals - 12/27/15 1923    PT LONG TERM GOAL #1   Title Patient will have a decrease in back and L hip pain to 5/10 or less at worst to improve ability to get out of bed in the morning with less pain.    Baseline  9/10 at worst   Time 4   Period Weeks   Status New   PT LONG TERM GOAL #2   Title Patient will improve bilateral hip strength by at least 1/2 MMT grade to promote ability to ambulate with less back and L hip pain.    Time 4   Period Weeks   Status New   PT LONG TERM GOAL #3   Title Patient will improve her Modified Oswestry Low back Pain Disability Questionnaire by at least 6 points as a demonstration of improved function.    Baseline 36%   Time 4   Period Weeks   Status New               Plan - 12/27/15 1654    Clinical Impression Statement Patient is a 35 year old female who came to physical therapy secondary to low back and L hip pain. She also presents with altered gait pattern and posture, TTP to low back, increased movement around the thoracolumbar junction area, bilateral hip weakness, and difficulty performing functional tasks such as walking and tolerating positions such as sitting and the supine position. Patient will benefit from skilled physical therapy services to address the aforementioned deficits.    Pt will benefit from skilled therapeutic intervention in order to improve on the following deficits Pain;Difficulty walking;Decreased strength;Postural dysfunction;Improper body mechanics   Rehab Potential Good   Clinical Impairments Affecting Rehab Potential chronicity of condition   PT Frequency 2x / week   PT Duration 4 weeks   PT  Treatment/Interventions Therapeutic exercise;Therapeutic activities;Manual techniques;Aquatic Therapy;Electrical Stimulation;Iontophoresis 4mg /ml Dexamethasone;Neuromuscular re-education;Ultrasound;Patient/family education   PT Next Visit Plan core strengthening, lumbopelvic control, hip strengthening, thoracic extension, posture   Consulted and Agree with Plan of Care Patient         Problem List Patient Active Problem List   Diagnosis Date Noted  . 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 08/28/2015  . Morbid obesity ([place]) 08/27/2015   Thank you for your referral.  Joneen Boers PT, DPT   12/27/2015, 7:31 PM  [facility] [address], [place], [address] Phone: [phone]   Fax:  [phone]  Name: Sully Mutchler MRN: [id number] Date of Birth: Mar 09, 1981

## 2015-12-31 ENCOUNTER — Ambulatory Visit: Payer: BC Managed Care – PPO

## 2015-12-31 DIAGNOSIS — M545 Low back pain: Secondary | ICD-10-CM | POA: Diagnosis not present

## 2015-12-31 DIAGNOSIS — R531 Weakness: Secondary | ICD-10-CM

## 2015-12-31 DIAGNOSIS — G8929 Other chronic pain: Secondary | ICD-10-CM

## 2015-12-31 NOTE — Patient Instructions (Addendum)
  Gave supine transversus abdominis and pelvic floor contractions 10x3 with 5 second holds 3x/day 5 days /week  and supine hip fall outs 3x5 with 5 second holds, 3x/day if able, working towards 10x3 with 5 second holds, 3x/day, 5 days per week  as part of her HEP. Pt demonstrated and verbalized understanding.  Handout provided.    Clam Shell 45 Degrees    Lying with hips and knees bent 45, one pillow between knees and ankles. Lift knee. Be sure pelvis does not roll backward. Do not arch back. Do __5_ times, each leg, _3__ times per day.  http://ss.exer.us/75   Copyright  VHI. All rights reserved.

## 2015-12-31 NOTE — Therapy (Signed)
[facility] [address] [place], [address] Phone: [phone]   Fax:  [phone]  Physical Therapy Treatment  Patient Details  Name: Mindy Reed MRN: [id number] Date of Birth: 1980-10-22 Referring Provider: Charlott Holler, FNP  Encounter Date: 12/31/2015      PT End of Session - 12/31/15 1301    Visit Number 2   Number of Visits 9   Date for PT Re-Evaluation 01/24/16   PT Start Time 1301   PT Stop Time 1345   PT Time Calculation (min) 44 min   Activity Tolerance Patient tolerated treatment well   Behavior During Therapy [facility] for tasks assessed/performed      Past Medical History  Diagnosis Date  . Hypertension   . Asthma     seasonal  . Diabetes mellitus without complication ([place])     Type 2   . Depression   . Anxiety   . GERD (gastroesophageal reflux disease)     no meds currently  . Vaginal delivery 2015  . Pneumonia     Past Surgical History  Procedure Laterality Date  . Cholecystectomy    . Tonsillectomy    . Ovarian cyst removal    . Ankle surgery    . Hysteroscopy w/d&c N/A 07/18/2015    Procedure: DILATATION AND CURETTAGE /HYSTEROSCOPY;  Surgeon: Everlene Farrier, MD;  Location: [facility];  Service: Gynecology;  Laterality: N/A;  . Intrauterine device (iud) insertion N/A 07/18/2015    Procedure: INTRAUTERINE DEVICE (IUD) INSERTION;  Surgeon: Everlene Farrier, MD;  Location: [facility];  Service: Gynecology;  Laterality: N/A;  . Laparoscopic roux-en-y gastric bypass with hiatal hernia repair  08/27/2015    Procedure: LAPAROSCOPIC ROUX-EN-Y GASTRIC BYPASS WITH HIATAL HERNIA REPAIR;  Surgeon: Johnathan Hausen, MD;  Location: [facility];  Service: General;;  . Upper gi endoscopy  08/27/2015    Procedure: UPPER GI ENDOSCOPY;  Surgeon: Johnathan Hausen, MD;  Location: [facility];  Service: General;;    There were no vitals filed for this visit.  Visit Diagnosis:  Chronic low back pain  Weakness      Subjective  Assessment - 12/31/15 1303    Subjective The pillow under knees helps with the morning back pain. No issues for most of the weekend. Had to take tramadol this morning because her back was bothering her. Wearing tights because the pressure helps with back pain. 2/10 back pain currently (a little achy).   Pertinent History Low back and L hip pain began 2015 during her pregnancy. Did not bother her until after her gastric bypass surgery on 11/0/2016. Pt states that she was 480 lbs at her highest weight.  Pt denies bowel or bladder problems, denies saddle anesthesia symptoms. Had xrays for her back last week which revealed thinning between L1/L2 and L4/L5, no arthritis or injury detected.    Patient Stated Goals Reduce pain, improve stability, balance, learn a few tricks to help with her back, exercise.    Currently in Pain? Yes   Pain Score 2    Pain Onset More than a month ago         Objectives  There-ex   Directed patient with supine transversus abdominis contraction 10x2 with 5 second holds  Then with pelvic floor contractions 10x2 with 5 second holds  Then with hip fall outs in the hooklying position 5x 2 each LE. Increased time spent secondary to emphasis on quality of movement.   Reviewed and given aforementioned exercises as  part of her HEP. Pt demonstrated and verbalized understanding  Supine posterior pelvic tilts with bilateral hip extension isometrics in hooklying position 10x2 with 5 second holds   S/L  clam shells 5x3 each LE     Improved exercise technique, movement at target joints, use of target muscles after mod verbal, visual, tactile cues.     Pt tolerated session well without aggravation of back and L hip pain. Good transversus abdominis muscle activation palpated. Some difficulty with pelvic control with supine hip fall outs L > R.                      PT Education - 12/31/15 1308    Education provided Yes   Education Details ther-ex, HEP    Person(s) Educated Patient   Methods Explanation;Demonstration;Tactile cues;Verbal cues;Handout   Comprehension Verbalized understanding;Returned demonstration             PT Long Term Goals - 12/27/15 1923    PT LONG TERM GOAL #1   Title Patient will have a decrease in back and L hip pain to 5/10 or less at worst to improve ability to get out of bed in the morning with less pain.    Baseline 9/10 at worst   Time 4   Period Weeks   Status New   PT LONG TERM GOAL #2   Title Patient will improve bilateral hip strength by at least 1/2 MMT grade to promote ability to ambulate with less back and L hip pain.    Time 4   Period Weeks   Status New   PT LONG TERM GOAL #3   Title Patient will improve her Modified Oswestry Low back Pain Disability Questionnaire by at least 6 points as a demonstration of improved function.    Baseline 36%   Time 4   Period Weeks   Status New               Plan - 12/31/15 1309    Clinical Impression Statement Pt tolerated session well without aggravation of back and L hip pain. Good transversus abdominis muscle activation palpated. Some difficulty with pelvic control with supine hip fall outs L > R.    Pt will benefit from skilled therapeutic intervention in order to improve on the following deficits Pain;Difficulty walking;Decreased strength;Postural dysfunction;Improper body mechanics   Rehab Potential Good   Clinical Impairments Affecting Rehab Potential chronicity of condition   PT Frequency 2x / week   PT Duration 4 weeks   PT Treatment/Interventions Therapeutic exercise;Therapeutic activities;Manual techniques;Aquatic Therapy;Electrical Stimulation;Iontophoresis 4mg /ml Dexamethasone;Neuromuscular re-education;Ultrasound;Patient/family education   PT Next Visit Plan core strengthening, lumbopelvic control, hip strengthening, thoracic extension, posture   Consulted and Agree with Plan of Care Patient        Problem List Patient Active  Problem List   Diagnosis Date Noted  . 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 08/28/2015  . Morbid obesity ([place]) 08/27/2015    Joneen Boers PT, DPT   12/31/2015, 8:38 PM  [facility] [address] [place], [address] Phone: [phone]   Fax:  [phone]  Name: Mindy Reed MRN: [id number] Date of Birth: January 07, 1981

## 2016-01-02 ENCOUNTER — Ambulatory Visit: Payer: BC Managed Care – PPO

## 2016-01-07 ENCOUNTER — Ambulatory Visit: Payer: BC Managed Care – PPO

## 2016-01-10 ENCOUNTER — Ambulatory Visit: Payer: BC Managed Care – PPO

## 2016-01-14 ENCOUNTER — Ambulatory Visit: Payer: BC Managed Care – PPO

## 2016-01-17 ENCOUNTER — Ambulatory Visit: Payer: BC Managed Care – PPO

## 2016-01-17 DIAGNOSIS — G8929 Other chronic pain: Secondary | ICD-10-CM

## 2016-01-17 DIAGNOSIS — M545 Low back pain, unspecified: Secondary | ICD-10-CM

## 2016-01-17 DIAGNOSIS — R531 Weakness: Secondary | ICD-10-CM

## 2016-01-17 NOTE — Therapy (Signed)
[facility] [address], [place], [address] Phone: [phone]   Fax:  [phone]  Physical Therapy Treatment  Patient Details  Name: Mindy Reed MRN: [id number] Date of Birth: 04-22-1981 Referring Provider: Charlott Holler, FNP  Encounter Date: 01/17/2016      PT End of Session - 01/17/16 1435    Visit Number 3   Number of Visits 9   Date for PT Re-Evaluation 01/24/16   PT Start Time 5038   PT Stop Time 1520   PT Time Calculation (min) 44 min   Activity Tolerance Patient tolerated treatment well   Behavior During Therapy [facility] for tasks assessed/performed      Past Medical History  Diagnosis Date  . Hypertension   . Asthma     seasonal  . Diabetes mellitus without complication ([place])     Type 2   . Depression   . Anxiety   . GERD (gastroesophageal reflux disease)     no meds currently  . Vaginal delivery 2015  . Pneumonia     Past Surgical History  Procedure Laterality Date  . Cholecystectomy    . Tonsillectomy    . Ovarian cyst removal    . Ankle surgery    . Hysteroscopy w/d&c N/A 07/18/2015    Procedure: DILATATION AND CURETTAGE /HYSTEROSCOPY;  Surgeon: Everlene Farrier, MD;  Location: [facility];  Service: Gynecology;  Laterality: N/A;  . Intrauterine device (iud) insertion N/A 07/18/2015    Procedure: INTRAUTERINE DEVICE (IUD) INSERTION;  Surgeon: Everlene Farrier, MD;  Location: [facility];  Service: Gynecology;  Laterality: N/A;  . Laparoscopic roux-en-y gastric bypass with hiatal hernia repair  08/27/2015    Procedure: LAPAROSCOPIC ROUX-EN-Y GASTRIC BYPASS WITH HIATAL HERNIA REPAIR;  Surgeon: Johnathan Hausen, MD;  Location: [facility];  Service: General;;  . Upper gi endoscopy  08/27/2015    Procedure: UPPER GI ENDOSCOPY;  Surgeon: Johnathan Hausen, MD;  Location: [facility];  Service: General;;    There were no vitals filed for this visit.  Visit Diagnosis:  Chronic low back pain  Weakness      Subjective  Assessment - 01/17/16 1437    Subjective Switcing to tennis shoes and doing exercises helps. Also tightens abdomen and pelvic floor throughout the day which helps her back. 1/10 low back currently (5/10 at most for the past 3 days). L hip 4/10 currently (7/10 L hip at most for the past 3 days).   Pertinent History Low back and L hip pain began 2015 during her pregnancy. Did not bother her until after her gastric bypass surgery on 11/0/2016. Pt states that she was 480 lbs at her highest weight.  Pt denies bowel or bladder problems, denies saddle anesthesia symptoms. Had xrays for her back last week which revealed thinning between L1/L2 and L4/L5, no arthritis or injury detected.    Patient Stated Goals Reduce pain, improve stability, balance, learn a few tricks to help with her back, exercise.    Currently in Pain? Yes   Pain Score 4   L hip 4/10, 1/10 low back   Pain Onset More than a month ago   Multiple Pain Sites Yes  low back, L hip            [facility] PT Assessment - 01/17/16 1513    Observation/Other Assessments   Modified Oswertry 16% (20% improvement since initial evaluation)   Strength   Right Hip External Rotation  4/5   Right Hip ABduction  4/5   Left Hip External Rotation 4/5  no discomfort   Left Hip ABduction 4-/5   Right Knee Flexion 4+/5   Right Knee Extension 5/5   Left Knee Flexion 4+/5   Left Knee Extension 5/5     Objectives  There-ex   Directed patient with supine transversus abdominis and pelvic floor contraction 5 x 5 second holds,  Then with hip fall outs in the hooklying position 10x each LE.        Then with alternating knee extensions with emphasis on lumbopelvic and thigh stability 6x2 each LE  Bridge 5x3   S/L clam shells 5x3 each LE  SLS with opposite tip toe assist 10x3 with 5 second holds each LE (given as part of her HEP; pt demonstrated and verbalized understanding)   Standing bilateral shoulder extension resisting  green band 10x3 with 5 second holds    Improved exercise technique, movement at target joints, use of target muscles after mod verbal, visual, tactile cues.      Improved lumbopelvic and femoral control with hip fall out exercise. Good glute med muscle use felt with SLS with opposite tip toe assist exercise. Pt making good progress towards decreasing low back and hip symptoms.                        PT Education - 01/17/16 1444    Education provided Yes   Education Details ther-ex, HEP   Person(s) Educated Patient   Methods Explanation;Demonstration;Verbal cues;Tactile cues;Handout   Comprehension Verbalized understanding;Returned demonstration             PT Long Term Goals - 01/17/16 2314    PT LONG TERM GOAL #1   Title Patient will have a decrease in back and L hip pain to 5/10 or less at worst to improve ability to get out of bed in the morning with less pain.    Time 4   Period Weeks   Status On-going   PT LONG TERM GOAL #2   Title Patient will improve bilateral hip strength by at least 1/2 MMT grade to promote ability to ambulate with less back and L hip pain.    Time 4   Period Weeks   Status Partially Met   PT LONG TERM GOAL #3   Title Patient will improve her Modified Oswestry Low back Pain Disability Questionnaire by at least 6 points as a demonstration of improved function.    Time 4   Period Weeks   Status Achieved               Plan - 01/17/16 1444    Clinical Impression Statement Improved lumbopelvic and femoral control with hip fall out exercise. Good glute med muscle use felt with SLS with opposite tip toe assist exercise. Pt making good progress towards decreasing low back and hip symptoms.    Pt will benefit from skilled therapeutic intervention in order to improve on the following deficits Pain;Difficulty walking;Decreased strength;Postural dysfunction;Improper body mechanics   Rehab Potential Good   Clinical Impairments  Affecting Rehab Potential chronicity of condition   PT Frequency 2x / week   PT Duration 4 weeks   PT Treatment/Interventions Therapeutic exercise;Therapeutic activities;Manual techniques;Aquatic Therapy;Electrical Stimulation;Iontophoresis 63m/ml Dexamethasone;Neuromuscular re-education;Ultrasound;Patient/family education   PT Next Visit Plan core strengthening, lumbopelvic control, hip strengthening, thoracic extension, posture   Consulted and Agree with Plan of Care Patient        Problem List Patient Active Problem List  Diagnosis Date Noted  . 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 08/28/2015  . Morbid obesity ([place]) 08/27/2015    Joneen Boers PT, DPT   01/17/2016, 11:20 PM  [facility] [address] [place], [address] Phone: [phone]   Fax:  [phone]  Name: Mindy Reed MRN: [id number] Date of Birth: Apr 13, 1981

## 2016-01-17 NOTE — Patient Instructions (Addendum)
   Lie back, legs bent. Llift hips up.  Repeat ___5_ times. Do __3__ sessions per day.  Copyright  VHI. All rights reserved.   SLS with opposite tip toe assist given as part of her HEP 10x3 with 5 second holds. Pt demonstrated and verbalized understanding. Handout provided.

## 2016-01-24 ENCOUNTER — Ambulatory Visit: Payer: BC Managed Care – PPO | Attending: Nurse Practitioner

## 2016-01-24 DIAGNOSIS — M545 Low back pain, unspecified: Secondary | ICD-10-CM

## 2016-01-24 DIAGNOSIS — M6281 Muscle weakness (generalized): Secondary | ICD-10-CM | POA: Insufficient documentation

## 2016-01-24 DIAGNOSIS — G8929 Other chronic pain: Secondary | ICD-10-CM | POA: Diagnosis present

## 2016-01-24 DIAGNOSIS — M25552 Pain in left hip: Secondary | ICD-10-CM | POA: Insufficient documentation

## 2016-01-24 DIAGNOSIS — R531 Weakness: Secondary | ICD-10-CM

## 2016-01-24 NOTE — Patient Instructions (Addendum)
  Quadriceps Set (Prone)  Position yourself on your stomach with pillow under your abdomen and hips.  With toes supporting lower legs, squeeze rear end muscles and tighten thigh muscles to straighten knees. Hold _5___ seconds. Relax. Repeat __10__ times per set. Do ___2_ sets per session. Do ___1_ sessions per day.  http://orth.exer.us/726   Copyright  VHI. All rights reserved.   Bridge  (Squeeze your rear-end muscles, press your hands on your bed, point toes up) Lift hips up. Repeat __5__ times. Do __3__ sessions per day.  Copyright  VHI. All rights reserved.

## 2016-01-24 NOTE — Therapy (Signed)
[facility] [address], [place], [address] Phone: [phone]   Fax:  [phone]  Physical Therapy Treatment  Patient Details  Name: Mindy Reed MRN: [id number] Date of Birth: [birth year] Referring Provider: Charlott Holler, FNP  Encounter Date: 01/24/2016      PT End of Session - 01/24/16 1734    Visit Number 4   Number of Visits 9   Date for PT Re-Evaluation 01/24/16   PT Start Time 8921   PT Stop Time 1819   PT Time Calculation (min) 45 min   Activity Tolerance Patient tolerated treatment well   Behavior During Therapy [facility] for tasks assessed/performed      Past Medical History  Diagnosis Date  . Hypertension   . Asthma     seasonal  . Diabetes mellitus without complication ([place])     Type 2   . Depression   . Anxiety   . GERD (gastroesophageal reflux disease)     no meds currently  . Vaginal delivery 2015  . Pneumonia     Past Surgical History  Procedure Laterality Date  . Cholecystectomy    . Tonsillectomy    . Ovarian cyst removal    . Ankle surgery    . Hysteroscopy w/d&c N/A 07/18/2015    Procedure: DILATATION AND CURETTAGE /HYSTEROSCOPY;  Surgeon: Everlene Farrier, MD;  Location: [facility];  Service: Gynecology;  Laterality: N/A;  . Intrauterine device (iud) insertion N/A 07/18/2015    Procedure: INTRAUTERINE DEVICE (IUD) INSERTION;  Surgeon: Everlene Farrier, MD;  Location: [facility];  Service: Gynecology;  Laterality: N/A;  . Laparoscopic roux-en-y gastric bypass with hiatal hernia repair  08/27/2015    Procedure: LAPAROSCOPIC ROUX-EN-Y GASTRIC BYPASS WITH HIATAL HERNIA REPAIR;  Surgeon: Johnathan Hausen, MD;  Location: [facility];  Service: General;;  . Upper gi endoscopy  08/27/2015    Procedure: UPPER GI ENDOSCOPY;  Surgeon: Johnathan Hausen, MD;  Location: [facility];  Service: General;;    There were no vitals filed for this visit.  Visit Diagnosis:  Chronic low back pain  Weakness      Subjective  Assessment - 01/24/16 1737    Subjective Back is doing pretty good. The only time when she gets pain is after taking a shower but its getting less. L hip has not been as bad. 1/10 low back and L hip currently. 6/10 low back at most (Sunday), L hip 4/10 at most (Sunday). Symptoms increased after taking a shower Sunday. Leaning back to wash the shampoo out of her hair increases her symptoms.   Pertinent History Low back and L hip pain began 2015 during her pregnancy. Did not bother her until after her gastric bypass surgery on 11/0/2016. Pt states that she was 480 lbs at her highest weight.  Pt denies bowel or bladder problems, denies saddle anesthesia symptoms. Had xrays for her back last week which revealed thinning between L1/L2 and L4/L5, no arthritis or injury detected.    Patient Stated Goals Reduce pain, improve stability, balance, learn a few tricks to help with her back, exercise.    Currently in Pain? Yes   Pain Score 1    Pain Onset More than a month ago   Multiple Pain Sites Yes      Objectives  There-ex  Directed patient with prone planks (modified with knee bent) 5x5 seconds  Prone glute max set 10x10 seconds for 2 sets each LE   Standing bilateral shoulder extension resisting green  band 10x3 with 5 second holds   Forward wedding march holding onto 7 lbs weight 32 ft  T-band side step resisting green band 20 ft each direction  Reviewed HEP (please see patient instructions)  Reviewed plan of care with patient.   Improved exercise technique, movement at target joints, use of target muscles after mod verbal, visual, tactile cues.     Good use of core and hip muscles felt by pt. Patient has demonstrated significant improvement with low back and L hip pain since initial evaluation as well as improved L hip strength and improved Modified Oswestry Low Back Pain Disability Questionnaire suggesting improved function. Patient still demonstrates muscle weakness, low back and L hip  pain/discomfort and would benefit from continued skilled physical therapy services to address the aforementioned deficits.               [facility] PT Assessment - 01/24/16 1806    Observation/Other Assessments   Modified Oswertry 16% (20% improvement since initial evaluation)   Strength   Right Hip External Rotation  4/5   Right Hip ABduction 4/5   Left Hip External Rotation 4/5  no discomfort   Left Hip ABduction 4-/5   Right Knee Flexion 4+/5   Right Knee Extension 5/5   Left Knee Flexion 4+/5   Left Knee Extension 5/5                               PT Education - 01/24/16 1747    Education provided Yes   Education Details ther-ex, HEP, plan of care   Person(s) Educated Patient   Methods Explanation;Demonstration;Tactile cues;Verbal cues;Handout   Comprehension Verbalized understanding;Returned demonstration             PT Long Term Goals - 01/17/16 2314    PT LONG TERM GOAL #1   Title Patient will have a decrease in back and L hip pain to 5/10 or less at worst to improve ability to get out of bed in the morning with less pain.    Time 4   Period Weeks   Status On-going   PT LONG TERM GOAL #2   Title Patient will improve bilateral hip strength by at least 1/2 MMT grade to promote ability to ambulate with less back and L hip pain.    Time 4   Period Weeks   Status Partially Met   PT LONG TERM GOAL #3   Title Patient will improve her Modified Oswestry Low back Pain Disability Questionnaire by at least 6 points as a demonstration of improved function.    Time 4   Period Weeks   Status Achieved               Plan - 01/24/16 1748    Clinical Impression Statement Good use of core and hip muscles felt by pt. Patient has demonstrated significant improvement with low back and L hip pain since initial evaluation as well as improved L hip strength and improved Modified Oswestry Low Back Pain Disability Questionnaire suggesting improved  function. Patient still demonstrates muscle weakness, low back and L hip pain/discomfort and would benefit from continued skilled physical therapy services to address the aforementioned deficits.    Rehab Potential Good   Clinical Impairments Affecting Rehab Potential chronicity of condition   PT Frequency 2x / week   PT Duration 4 weeks   PT Treatment/Interventions Therapeutic exercise;Therapeutic activities;Manual techniques;Aquatic Therapy;Electrical Stimulation;Iontophoresis '4mg'$ /ml Dexamethasone;Neuromuscular re-education;Ultrasound;Patient/family education  PT Next Visit Plan core strengthening, lumbopelvic control, hip strengthening, thoracic extension, posture   Consulted and Agree with Plan of Care Patient        Problem List Patient Active Problem List   Diagnosis Date Noted  . 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 08/28/2015  . Morbid obesity ([place]) 08/27/2015    Thank you for your referral.  Joneen Boers PT, DPT   01/24/2016, 6:55 PM  [facility] [address], [place], [address] Phone: [phone]   Fax:  [phone]  Name: Mindy Reed MRN: [id number] Date of Birth: 26-Jan-1981

## 2016-01-30 ENCOUNTER — Ambulatory Visit: Payer: BC Managed Care – PPO

## 2016-02-01 ENCOUNTER — Encounter: Payer: BC Managed Care – PPO | Attending: Surgery | Admitting: Dietician

## 2016-02-01 ENCOUNTER — Encounter: Payer: Self-pay | Admitting: Dietician

## 2016-02-01 DIAGNOSIS — Z6841 Body Mass Index (BMI) 40.0 and over, adult: Secondary | ICD-10-CM | POA: Diagnosis not present

## 2016-02-01 DIAGNOSIS — Z713 Dietary counseling and surveillance: Secondary | ICD-10-CM | POA: Diagnosis not present

## 2016-02-01 NOTE — Patient Instructions (Addendum)
Goals:  Follow Phase 3B: High Protein + Non-Starchy Vegetables  Eat 3-6 small meals/snacks, every 3-5 hrs  Increase lean protein foods to meet 60g goal  Increase fluid intake to 64oz +  Avoid drinking 15 minutes before, during and 30 minutes after eating  Aim for >30 min of physical activity daily  Try Citracal Petites  Surgery date: 08/27/15 Surgery type: RYGB [facility] weight at [facility]: 444 lbs on 06/30/15 (highest weight 480 lbs per patient) Weight today: 333.5 lbs Weight change: 33 lbs Total weight lost: 110.5 lbs (146.5 lbs)  TANITA  BODY COMP RESULTS  07/16/15 09/11/15 10/29/15 12/07/15 02/01/16   BMI (kg/m^2) N/A 65.3 59.9 57.4 52.2   Fat Mass (lbs)  251.5 227 205.5 187.5   Fat Free Mass (lbs)  165.5 155.5 161 146   Total Body Water (lbs)  121.0 114 118 107

## 2016-02-01 NOTE — Progress Notes (Signed)
  Follow-up visit:  5 months Post-Operative RYGB Surgery  Medical Nutrition Therapy:  Appt start time: [id number] end time:  1000  Primary concerns today: Post-operative Bariatric Surgery Nutrition Management.  [place] returns having lost another 33 lbs. She is no longer taking Lantus and her last A1c was 5.3%. However, she has noticed that some foods still increase her blood sugar. Recently found herself falling into some old habits. Her husband lost his job a few weeks ago and she found herself stress eating. Immediately called Dr. Ardath Sax (psychology) to address this. Having back pain and in physical therapy 1 day a week.   Excited to be the weight she was in 9th grade (has gone from a 5x to a 3x).   Surgery date: 08/27/15 Surgery type: RYGB Start weight at [facility]: 444 lbs on 06/30/15 (highest weight 480 lbs per patient) Weight today: 333.5 lbs Weight change: 33 lbs Total weight lost: 110.5 lbs (146.5 lbs) Weight goal: 200 lbs  TANITA  BODY COMP RESULTS  07/16/15 09/11/15 10/29/15 12/07/15 02/01/16   BMI (kg/m^2) N/A 65.3 59.9 57.4 52.2   Fat Mass (lbs)  251.5 227 205.5 187.5   Fat Free Mass (lbs)  165.5 155.5 161 146   Total Body Water (lbs)  121.0 114 118 107    Preferred Learning Style:   No preference indicated   Learning Readiness:   Ready  24-hr recall: B (6:30 AM): 1/2 Premier protein drink OR Atkins Lift (15-20g) Snk (9-9:30AM): 1/4 cup cottage cheese with vegetable  L (12-12:30PM): 1-1.5 oz steak and chicken with vegetable (10g) Snk (3-3:30PM): sometimes cottage cheese or cheese if she has an early lunch  D (6-6:30PM): taco seasoned beef with refried beans and cheese (14g) Snk (PM):   Fluid intake: FairLife milk, protein shake, water with sugar free flavoring, Powerade Zero, sugar free vitamin water (80-100 oz per day per patient) Estimated total protein intake: 60-80 grams per day per patient  Medications: see list Supplementation: taking  CBG monitoring: am and 1-2x  throughout the day Average CBG per patient: 98-114 mg/dL fasting Last patient reported A1c: 5.3%  Using straws: no Drinking while eating: no, drank with meal 1x and noticed that she was able to eat much more Hair loss: some thinning Carbonated beverages: none N/V/D/C: takes Colace daily for constipation Dumping syndrome: migraines and diarrhea from McDonald's  Recent physical activity:  Physical therapy exercises  Progress Towards Goal(s):  In progress.  Handouts given during visit include:  none   Nutritional Diagnosis:  [place]-3.3 Overweight/obesity related to past poor dietary habits and physical inactivity as evidenced by patient w/ recent RYGB surgery following dietary guidelines for continued weight loss.     Intervention:  Nutrition counseling provided.  Teaching Method Utilized:  Visual Auditory Hands on  Barriers to learning/adherence to lifestyle change: none  Demonstrated degree of understanding via:  Teach Back   Monitoring/Evaluation:  Dietary intake, exercise, and body weight. Follow up in 2 months for 7 month post-op visit.

## 2016-02-04 ENCOUNTER — Ambulatory Visit: Payer: BC Managed Care – PPO

## 2016-02-04 DIAGNOSIS — M545 Low back pain: Secondary | ICD-10-CM | POA: Diagnosis not present

## 2016-02-04 DIAGNOSIS — M25552 Pain in left hip: Secondary | ICD-10-CM

## 2016-02-04 DIAGNOSIS — M6281 Muscle weakness (generalized): Secondary | ICD-10-CM

## 2016-02-04 NOTE — Therapy (Signed)
[place] [facility] [address] [place], [address] Phone: [phone]   Fax:  [phone]  Physical Therapy Treatment And Progress Report  Patient Details  Name: Mindy Reed MRN: [id number] Date of Birth: 11-02-1980 Referring Provider: Charlott Holler, FNP  Encounter Date: 02/04/2016      PT End of Session - 02/04/16 1448    Visit Number 5   Number of Visits 17   Date for PT Re-Evaluation 03/06/16   PT Start Time 4008   PT Stop Time 1532   PT Time Calculation (min) 44 min   Activity Tolerance Patient tolerated treatment well   Behavior During Therapy [facility] for tasks assessed/performed      Past Medical History  Diagnosis Date  . Hypertension   . Asthma     seasonal  . Diabetes mellitus without complication ([place])     Type 2   . Depression   . Anxiety   . GERD (gastroesophageal reflux disease)     no meds currently  . Vaginal delivery 2015  . Pneumonia     Past Surgical History  Procedure Laterality Date  . Cholecystectomy    . Tonsillectomy    . Ovarian cyst removal    . Ankle surgery    . Hysteroscopy w/d&c N/A 07/18/2015    Procedure: DILATATION AND CURETTAGE /HYSTEROSCOPY;  Surgeon: Everlene Farrier, MD;  Location: [place] [facility];  Service: Gynecology;  Laterality: N/A;  . Intrauterine device (iud) insertion N/A 07/18/2015    Procedure: INTRAUTERINE DEVICE (IUD) INSERTION;  Surgeon: Everlene Farrier, MD;  Location: [place] [facility];  Service: Gynecology;  Laterality: N/A;  . Laparoscopic roux-en-y gastric bypass with hiatal hernia repair  08/27/2015    Procedure: LAPAROSCOPIC ROUX-EN-Y GASTRIC BYPASS WITH HIATAL HERNIA REPAIR;  Surgeon: Johnathan Hausen, MD;  Location: [facility];  Service: General;;  . Upper gi endoscopy  08/27/2015    Procedure: UPPER GI ENDOSCOPY;  Surgeon: Johnathan Hausen, MD;  Location: [facility];  Service: General;;    There were no vitals filed for this visit.      Subjective Assessment - 02/04/16 1449    Subjective Low back feels sore (4/10 currently) with a lot of pressure. L hip is not hurting. Missed last session secondary to back pain increased to 9/10. Went to her doctor. No change in medications. Per MD, there was some swelling. Pt was also negative for UTI. Can't take NSAIDs due to surgery. Does not know what increased her low back pain. Pain increased last Saturday, was walking around. Was not able to go to church last Sunday.    Pertinent History Low back and L hip pain began 2015 during her pregnancy. Did not bother her until after her gastric bypass surgery on 11/0/2016. Pt states that she was 480 lbs at her highest weight.  Pt denies bowel or bladder problems, denies saddle anesthesia symptoms. Had xrays for her back last week which revealed thinning between L1/L2 and L4/L5, no arthritis or injury detected.    Patient Stated Goals Reduce pain, improve stability, balance, learn a few tricks to help with her back, exercise.    Currently in Pain? Yes   Pain Score 4    Pain Orientation Lower   Pain Onset More than a month ago   Multiple Pain Sites No            [facility] PT Assessment - 02/04/16 1518    Strength   Right Hip External Rotation  4/5  with low  back pain which eased with rest   Right Hip ABduction 4/5   Left Hip External Rotation 4/5  with low back and L hip pain   Left Hip ABduction 4/5      Objectives  There-ex   Directed patient with supine transversus abdominis and pelvic floor contraction:    with hip fall outs in the hooklying position 5x each LE.    then with alternating knee extensions with emphasis on lumbopelvic and thigh stability 5x2 each LE    Pt demonstrates L pelvic rotation with L knee extension  Supine posterior pelvic tilts 5x5 seconds for 3 sets   Bridge 5x (increased low back pain, decreased with rest)  Supine single knee to chest 5x5 seconds (decreased low back pressure) for 2 sets each LE  Decreased back pain to 3/10 Seated trunk  flexion with physioball 5 seconds (increased back pain, decreased with rest) Standing bilateral shoulder extension resisting green band 10x2 Manually resisted seated hip ER, S/L hip abduction 1x each way for each LE  Reviewed progress/current status with hip strength with pt  Standing R shoulder adduction resisting green band 10x5 seconds   Improved exercise technique, movement at target joints, use of target muscles after mod verbal, visual, tactile cues.    Slight decrease in back pain after supine single knee to chest. Improved L hip ER and abduction strength since initial evaluation. Decreased back pain to 2/10 after session. Pt was progressing very well with physical therapy towards decreased back pain last session. Pt however had a set back during the weekend with increased back pain. Symptoms better today. Patient would benefit from continued skilled physical therapy services to promote decrease in back and L hip pain, increase lumbopelvic stability and hip strength, and improve ability to perform functional tasks.                       PT Education - 02/04/16 1457    Education provided Yes   Education Details ther-ex   [employer]) Educated Patient   Methods Explanation;Demonstration;Tactile cues;Verbal cues   Comprehension Verbalized understanding;Returned demonstration             PT Long Term Goals - 02/04/16 1523    PT LONG TERM GOAL #1   Title Patient will have a decrease in back and L hip pain to 5/10 or less at worst to improve ability to get out of bed in the morning with less pain.    Time 4   Period Weeks   Status On-going   PT LONG TERM GOAL #2   Title Patient will improve bilateral hip strength by at least 1/2 MMT grade to promote ability to ambulate with less back and L hip pain.    Time 4   Period Weeks   Status Partially Met   PT LONG TERM GOAL #3   Title Patient will improve her Modified Oswestry Low back Pain Disability Questionnaire by at  least 6 points as a demonstration of improved function.    Baseline Ongoing secondary to flare-ups   Time 4   Period Weeks   Status On-going               Plan - 02/04/16 1500    Clinical Impression Statement Slight decrease in back pain after supine single knee to chest. Improved L hip ER and abduction strength since initial evaluation. Decreased back pain to 2/10 after session. Pt was progressing very well with physical therapy towards decreased back  pain last session. Pt however had a set back during the weekend with increased back pain. Symptoms better today. Patient would benefit from continued skilled physical therapy services to promote decrease in back and L hip pain, increase lumbopelvic stability and hip strength, and improve ability to perform functional tasks.    Rehab Potential Good   Clinical Impairments Affecting Rehab Potential chronicity of condition   PT Frequency 2x / week   PT Duration 4 weeks   PT Treatment/Interventions Therapeutic exercise;Therapeutic activities;Manual techniques;Aquatic Therapy;Electrical Stimulation;Iontophoresis '4mg'$ /ml Dexamethasone;Neuromuscular re-education;Ultrasound;Patient/family education   PT Next Visit Plan core strengthening, lumbopelvic control, hip strengthening, thoracic extension, posture   Consulted and Agree with Plan of Care Patient      Patient will benefit from skilled therapeutic intervention in order to improve the following deficits and impairments:  Pain, Difficulty walking, Decreased strength, Postural dysfunction, Improper body mechanics  Visit Diagnosis: Bilateral low back pain, with sciatica presence unspecified - Plan: PT plan of care cert/re-cert  Muscle weakness (generalized) - Plan: PT plan of care cert/re-cert  Pain in left hip - Plan: PT plan of care cert/re-cert     Problem List Patient Active Problem List   Diagnosis Date Noted  . 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 08/28/2015  . Morbid  obesity ([place]) 08/27/2015   Thank you for your referral.   Joneen Boers PT, DPT   02/04/2016, 5:45 PM  [place] [facility] [address] [place], [address] Phone: [phone]   Fax:  [phone]  Name: Corinthian Kemler MRN: [id number] Date of Birth: Feb 03, 1981

## 2016-02-04 NOTE — Patient Instructions (Signed)
Gave supine single knee to chest as part of her HEP, holding for 5 seconds (10x3 each LE daily). Pt demonstrated and verbalized understanding.

## 2016-02-18 ENCOUNTER — Ambulatory Visit: Payer: BC Managed Care – PPO | Attending: Nurse Practitioner

## 2016-02-18 DIAGNOSIS — M25552 Pain in left hip: Secondary | ICD-10-CM | POA: Diagnosis present

## 2016-02-18 DIAGNOSIS — M6281 Muscle weakness (generalized): Secondary | ICD-10-CM | POA: Insufficient documentation

## 2016-02-18 NOTE — Therapy (Signed)
[place] [facility] [address] [place], [address] Phone: [phone]   Fax:  [phone]  Physical Therapy Treatment  Patient Details  Name: Mindy Reed MRN: [id number] Date of Birth: 14-Jul-1981 Referring Provider: Charlott Holler, FNP  Encounter Date: 02/18/2016      PT End of Session - 02/18/16 1425    Visit Number 6   Number of Visits 17   Date for PT Re-Evaluation 03/06/16   PT Start Time 8756   PT Stop Time 1500   PT Time Calculation (min) 40 min   Activity Tolerance Patient tolerated treatment well   Behavior During Therapy [facility] for tasks assessed/performed      Past Medical History  Diagnosis Date  . Hypertension   . Asthma     seasonal  . Diabetes mellitus without complication ([place])     Type 2   . Depression   . Anxiety   . GERD (gastroesophageal reflux disease)     no meds currently  . Vaginal delivery 2015  . Pneumonia     Past Surgical History  Procedure Laterality Date  . Cholecystectomy    . Tonsillectomy    . Ovarian cyst removal    . Ankle surgery    . Hysteroscopy w/d&c N/A 07/18/2015    Procedure: DILATATION AND CURETTAGE /HYSTEROSCOPY;  Surgeon: Everlene Farrier, MD;  Location: [place] [facility];  Service: Gynecology;  Laterality: N/A;  . Intrauterine device (iud) insertion N/A 07/18/2015    Procedure: INTRAUTERINE DEVICE (IUD) INSERTION;  Surgeon: Everlene Farrier, MD;  Location: [facility] [facility];  Service: Gynecology;  Laterality: N/A;  . Laparoscopic roux-en-y gastric bypass with hiatal hernia repair  08/27/2015    Procedure: LAPAROSCOPIC ROUX-EN-Y GASTRIC BYPASS WITH HIATAL HERNIA REPAIR;  Surgeon: Johnathan Hausen, MD;  Location: [facility];  Service: General;;  . Upper gi endoscopy  08/27/2015    Procedure: UPPER GI ENDOSCOPY;  Surgeon: Johnathan Hausen, MD;  Location: [facility];  Service: General;;    There were no vitals filed for this visit.      Subjective Assessment - 02/18/16 1423    Subjective Pt reports  she is doing well currently. She denies back pain but reports 3/10 L posterior/posterolateral hip pain. HEP is going well without any specific questions or concerns.    Pertinent History Low back and L hip pain began 2015 during her pregnancy. Did not bother her until after her gastric bypass surgery on 11/0/2016. Pt states that she was 480 lbs at her highest weight.  Pt denies bowel or bladder problems, denies saddle anesthesia symptoms. Had xrays for her back last week which revealed thinning between L1/L2 and L4/L5, no arthritis or injury detected.    Limitations Other (comment)   Patient Stated Goals Reduce pain, improve stability, balance, learn a few tricks to help with her back, exercise.    Currently in Pain? Yes   Pain Score 3    Pain Location Hip   Pain Orientation Left   Pain Onset More than a month ago       Objective  Ther-ex Hooklying hip fall outs x 10 each direction, pt reports increase in L lateral hip stretching/pain; L FADIR stretch with positive reproduction of L lateral hip pain/stretch, 30 seconds x 2; Hooklying R Tband clams 2 x 10; Supine posterior pelvic tilts 5x5 seconds for 3 sets L single leg bridges 2 x 5; no increased back pain;  R sidelying L hip abduction 2 x 10; Standing mid trap  rows 2 x 10; Standing low rows/shoulder extension 2 x 10;   Manual Therapy R sidelying foam rolling to L IT band, TFL, and over lateral L hip x 10 minutes, pt reports complete resolution of pain following foam rolling;                        PT Education - 02/18/16 1425    Education provided Yes   Education Details HEP reinforced   Person(s) Educated Patient   Methods Explanation   Comprehension Verbalized understanding;Returned demonstration             PT Long Term Goals - 02/04/16 1523    PT LONG TERM GOAL #1   Title Patient will have a decrease in back and L hip pain to 5/10 or less at worst to improve ability to get out of bed in the  morning with less pain.    Time 4   Period Weeks   Status On-going   PT LONG TERM GOAL #2   Title Patient will improve bilateral hip strength by at least 1/2 MMT grade to promote ability to ambulate with less back and L hip pain.    Time 4   Period Weeks   Status Partially Met   PT LONG TERM GOAL #3   Title Patient will improve her Modified Oswestry Low back Pain Disability Questionnaire by at least 6 points as a demonstration of improved function.    Baseline Ongoing secondary to flare-ups   Time 4   Period Weeks   Status On-going               Plan - 02/18/16 1426    Clinical Impression Statement Pt reports complete resolution of L hip pain following foam rolling. She states, "It's the best I've felt in weeks." Encouraged pt to continue HEP and follow-up as scheduled.    Rehab Potential Good   Clinical Impairments Affecting Rehab Potential chronicity of condition   PT Frequency 2x / week   PT Duration 4 weeks   PT Treatment/Interventions Therapeutic exercise;Therapeutic activities;Manual techniques;Aquatic Therapy;Electrical Stimulation;Iontophoresis 6m/ml Dexamethasone;Neuromuscular re-education;Ultrasound;Patient/family education   PT Next Visit Plan core strengthening, lumbopelvic control, hip strengthening, thoracic extension, posture   PT Home Exercise Plan Continue as prescribed   Consulted and Agree with Plan of Care Patient      Patient will benefit from skilled therapeutic intervention in order to improve the following deficits and impairments:  Pain, Difficulty walking, Decreased strength, Postural dysfunction, Improper body mechanics  Visit Diagnosis: Muscle weakness (generalized)  Pain in left hip     Problem List Patient Active Problem List   Diagnosis Date Noted  . 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 08/28/2015  . Morbid obesity ([place] 08/27/2015   JPhillips GroutPT, DPT   Mindy Reed 02/19/2016, 9:58 AM  [facility] [address] [place] [address]Phone: [phone]  Fax:  [phone] Name: Mindy HashimiMRN: [id number] of Birth: [birth year]

## 2016-02-25 ENCOUNTER — Ambulatory Visit: Payer: BC Managed Care – PPO

## 2016-03-03 ENCOUNTER — Ambulatory Visit: Payer: BC Managed Care – PPO

## 2016-03-10 ENCOUNTER — Ambulatory Visit: Payer: BC Managed Care – PPO

## 2016-03-28 ENCOUNTER — Ambulatory Visit: Payer: BC Managed Care – PPO | Admitting: Dietician

## 2016-04-25 ENCOUNTER — Encounter: Payer: BC Managed Care – PPO | Attending: Surgery | Admitting: Dietician

## 2016-04-25 ENCOUNTER — Encounter: Payer: Self-pay | Admitting: Dietician

## 2016-04-25 DIAGNOSIS — Z713 Dietary counseling and surveillance: Secondary | ICD-10-CM | POA: Insufficient documentation

## 2016-04-25 DIAGNOSIS — Z6841 Body Mass Index (BMI) 40.0 and over, adult: Secondary | ICD-10-CM | POA: Diagnosis not present

## 2016-04-25 NOTE — Progress Notes (Signed)
  Follow-up visit:  8 months Post-Operative RYGB Surgery  Medical Nutrition Therapy:  Appt start time: 1000 end time:  1040  Primary concerns today: Post-operative Bariatric Surgery Nutrition Management.  Mindy Reed returns having lost another 23 lbs. Has been drinking while eating and notices it causes her to be able to eat more. Also craving a sandwich and watermelon. Finding herself wanting to "munch all day." Has increased physical activity (walking and strength training at home). Has been meeting with Dr. Ardath Sax regularly.   Excited to be the weight she was in 9th grade (has gone from a 5x to a 2x), also has more clothing options.   Surgery date: 08/27/15 Surgery type: RYGB Start weight at [facility]: 444 lbs on 06/30/15 (highest weight 480 lbs per patient) Weight today: 310.8 lbs Weight change: 22.7 lbs Total weight lost: 133.2 lbs (169.2 lbs) Weight goal: 200 lbs  TANITA  BODY COMP RESULTS  07/16/15 09/11/15 10/29/15 12/07/15 02/01/16 04/25/16   BMI (kg/m^2) N/A 65.3 59.9 57.4 52.2 48.7   Fat Mass (lbs)  251.5 227 205.5 187.5 166.2   Fat Free Mass (lbs)  165.5 155.5 161 146 144.6   Total Body Water (lbs)  121.0 114 118 107 108.5    Preferred Learning Style:   No preference indicated   Learning Readiness:   Ready  24-hr recall: B (6:30 AM): 1/2 Premier protein drink OR Atkins Lift (15-20g) Snk (9-9:30AM): 1/4 cup cottage cheese with vegetable  L (12-12:30PM): 1-1.5 oz steak and chicken with vegetable (10g) Snk (3-3:30PM): sometimes cottage cheese or cheese if she has an early lunch  D (6-6:30PM): taco seasoned beef with refried beans and cheese (14g) Snk (PM):   Fluid intake: FairLife milk, protein shake, water with sugar free flavoring, Powerade Zero, sugar free vitamin water (80-100 oz per day per patient) Estimated total protein intake: 60-80 grams per day per patient  Medications: see list Supplementation: taking  CBG monitoring: am and 1-2x throughout the day Average CBG per  patient: 98-114 mg/dL fasting Last patient reported A1c: 5.2%  Using straws: no Drinking while eating: some Hair loss: having some regrowth Carbonated beverages: none N/V/D/C: takes Colace daily for constipation Dumping syndrome: migraines and diarrhea from [employer]  Recent physical activity:  Physical therapy exercises  Progress Towards Goal(s):  In progress.  Handouts given during visit include:  none   Nutritional Diagnosis:  [place]-3.3 Overweight/obesity related to past poor dietary habits and physical inactivity as evidenced by patient w/ recent RYGB surgery following dietary guidelines for continued weight loss.     Intervention:  Nutrition counseling provided.  Teaching Method Utilized:  Visual Auditory Hands on  Barriers to learning/adherence to lifestyle change: none  Demonstrated degree of understanding via:  Teach Back   Monitoring/Evaluation:  Dietary intake, exercise, and body weight. Follow up in 2 months for 10 month post-op visit.

## 2016-04-25 NOTE — Patient Instructions (Addendum)
Goals:  Follow Phase 3B: High Protein + Non-Starchy Vegetables  Eat 3-6 small meals/snacks, every 3-5 hrs  Increase lean protein foods to meet 60g goal  Increase fluid intake to 64oz +  Avoid drinking 15 minutes before, during and 30 minutes after eating  Aim for >30 min of physical activity daily  Try Citracal Petites  Get back to journaling foods  Try a small amount of watermelon, have a protein food with it, and keep an eye on blood sugar  Preportion snack foods like pork skins and nuts!  Work on mindful eating (eating slowly and enjoying each bite)   Surgery date: 08/27/15 Surgery type: RYGB Start weight at [facility]: 444 lbs on 06/30/15 (highest weight 480 lbs per patient) Weight today: 310.8 lbs Weight change: 22.7 lbs Total weight lost: 133.2 lbs (169.2 lbs) Weight goal: 200 lbs  TANITA  BODY COMP RESULTS  07/16/15 09/11/15 10/29/15 12/07/15 02/01/16 04/25/16   BMI (kg/m^2) N/A 65.3 59.9 57.4 52.2 48.7   Fat Mass (lbs)  251.5 227 205.5 187.5 166.2   Fat Free Mass (lbs)  165.5 155.5 161 146 144.6   Total Body Water (lbs)  121.0 114 118 107 108.5

## 2016-07-03 ENCOUNTER — Other Ambulatory Visit: Payer: Self-pay | Admitting: Obstetrics and Gynecology

## 2016-07-03 DIAGNOSIS — R928 Other abnormal and inconclusive findings on diagnostic imaging of breast: Secondary | ICD-10-CM

## 2016-07-10 ENCOUNTER — Other Ambulatory Visit: Payer: Self-pay | Admitting: Obstetrics and Gynecology

## 2016-07-10 ENCOUNTER — Ambulatory Visit
Admission: RE | Admit: 2016-07-10 | Discharge: 2016-07-10 | Disposition: A | Discharge status: Home / Self Care | Payer: BC Managed Care – PPO | Source: Ambulatory Visit | Attending: Obstetrics and Gynecology | Admitting: Obstetrics and Gynecology

## 2016-07-10 DIAGNOSIS — R928 Other abnormal and inconclusive findings on diagnostic imaging of breast: Secondary | ICD-10-CM

## 2016-07-10 DIAGNOSIS — N6320 Unspecified lump in the left breast, unspecified quadrant: Secondary | ICD-10-CM

## 2016-07-11 ENCOUNTER — Encounter: Payer: Self-pay | Admitting: Dietician

## 2016-07-11 ENCOUNTER — Encounter: Payer: BC Managed Care – PPO | Attending: Surgery | Admitting: Dietician

## 2016-07-11 DIAGNOSIS — Z713 Dietary counseling and surveillance: Secondary | ICD-10-CM | POA: Diagnosis not present

## 2016-07-11 DIAGNOSIS — Z6841 Body Mass Index (BMI) 40.0 and over, adult: Secondary | ICD-10-CM | POA: Diagnosis not present

## 2016-07-11 NOTE — Progress Notes (Signed)
  Follow-up visit:  10.5 months Post-Operative RYGB Surgery  Medical Nutrition Therapy:  Appt start time: [id number] end time:  1000  Primary concerns today: Post-operative Bariatric Surgery Nutrition Management.  Mindy Reed returns having lost another 7 pounds. States that old habits are coming back and has been at a stall for 2 months. Happy with her current weight.  She followed up with her OBGYN to discuss having a second child. A lump was found in her breast and she is waiting for a biopsy next week.   Surgery date: 08/27/15 Surgery type: RYGB Start weight at [facility]: 444 lbs on 06/30/15 (highest weight 480 lbs per patient) Weight today: 303.6 lbs Weight change: 7 lbs Total weight lost: 176 lbs  TANITA  BODY COMP RESULTS  07/16/15 09/11/15 10/29/15 12/07/15 02/01/16 04/25/16 07/11/16   BMI (kg/m^2) N/A 65.3 59.9 57.4 52.2 48.7 47.5   Fat Mass (lbs)  251.5 227 205.5 187.5 166.2 145.2   Fat Free Mass (lbs)  165.5 155.5 161 146 144.6 158.4   Total Body Water (lbs)  121.0 114 118 107 108.5 118    Preferred Learning Style:   No preference indicated   Learning Readiness:   Ready  24-hr recall: B (6:30 AM): 1/2 Premier protein drink OR Atkins Lift (15-20g) Snk (9-9:30AM): 1/4 cup cottage cheese with vegetable  L (12-12:30PM): 1-1.5 oz steak and chicken with vegetable (10g) Snk (3-3:30PM): sometimes cottage cheese or cheese if she has an early lunch  D (6-6:30PM): taco seasoned beef with refried beans and cheese (14g) Snk (PM):   Fluid intake: FairLife milk, protein shake, water with sugar free flavoring, Powerade Zero, sugar free vitamin water (80-100 oz per day per patient) Estimated total protein intake: 80-100 grams per day per patient  Medications: see list Supplementation: taking  CBG monitoring: am and 1-2x throughout the day Average CBG per patient: 98-114 mg/dL fasting Last patient reported A1c: 5.2%  Using straws: no Drinking while eating: some Hair loss: having some  regrowth Carbonated beverages: none N/V/D/C: takes Colace daily for constipation Dumping syndrome: migraines and diarrhea from [employer]  Recent physical activity: walking  Progress Towards Goal(s):  In progress.  Handouts given during visit include:  none   Nutritional Diagnosis:  [place]-3.3 Overweight/obesity related to past poor dietary habits and physical inactivity as evidenced by patient w/ recent RYGB surgery following dietary guidelines for continued weight loss.     Intervention:  Nutrition counseling provided.  Teaching Method Utilized:  Visual Auditory Hands on  Barriers to learning/adherence to lifestyle change: none  Demonstrated degree of understanding via:  Teach Back   Monitoring/Evaluation:  Dietary intake, exercise, and body weight. Follow up in 2 months for 12.5 month post-op visit.

## 2016-07-11 NOTE — Patient Instructions (Addendum)
Goals:  Continue to surround yourself with positive people  Post your favorite uplifting bible verses around your house  Make an appointment with Dr. Ardath Sax   Get back into hobbies/activities that nourish you and promote peace  Aim for about 60 grams of carbs per day (spaced out!)  About 15 grams 4x a day   Surgery date: 08/27/15 Surgery type: RYGB Start weight at [facility]: 444 lbs on 06/30/15 (highest weight 480 lbs per patient) Weight today: 303.6 lbs Weight change: 7 lbs Total weight lost: 176 lbs  TANITA  BODY COMP RESULTS  07/16/15 09/11/15 10/29/15 12/07/15 02/01/16 04/25/16 07/11/16   BMI (kg/m^2) N/A 65.3 59.9 57.4 52.2 48.7 47.5   Fat Mass (lbs)  251.5 227 205.5 187.5 166.2 145.2   Fat Free Mass (lbs)  165.5 155.5 161 146 144.6 158.4   Total Body Water (lbs)  121.0 114 118 107 108.5 118

## 2016-07-15 ENCOUNTER — Other Ambulatory Visit: Payer: Self-pay | Admitting: Obstetrics and Gynecology

## 2016-07-15 DIAGNOSIS — N6320 Unspecified lump in the left breast, unspecified quadrant: Secondary | ICD-10-CM

## 2016-07-16 ENCOUNTER — Ambulatory Visit
Admission: RE | Admit: 2016-07-16 | Discharge: 2016-07-16 | Disposition: A | Discharge status: Home / Self Care | Payer: BC Managed Care – PPO | Source: Ambulatory Visit | Attending: Obstetrics and Gynecology | Admitting: Obstetrics and Gynecology

## 2016-07-16 DIAGNOSIS — N6320 Unspecified lump in the left breast, unspecified quadrant: Secondary | ICD-10-CM

## 2016-07-31 ENCOUNTER — Ambulatory Visit: Payer: Self-pay | Admitting: Surgery

## 2016-07-31 ENCOUNTER — Other Ambulatory Visit: Payer: Self-pay | Admitting: Surgery

## 2016-07-31 DIAGNOSIS — N6320 Unspecified lump in the left breast, unspecified quadrant: Secondary | ICD-10-CM

## 2016-08-08 ENCOUNTER — Ambulatory Visit: Payer: Self-pay | Admitting: Surgery

## 2016-08-20 DIAGNOSIS — N6320 Unspecified lump in the left breast, unspecified quadrant: Secondary | ICD-10-CM

## 2016-08-20 HISTORY — DX: Unspecified lump in the left breast, unspecified quadrant: N63.20

## 2016-08-25 ENCOUNTER — Encounter (HOSPITAL_BASED_OUTPATIENT_CLINIC_OR_DEPARTMENT_OTHER): Payer: Self-pay | Admitting: *Deleted

## 2016-08-25 NOTE — Pre-Procedure Instructions (Signed)
To come for anesthesia airway evaluation and to pick up 8 oz. water to drink AM DOS

## 2016-08-29 NOTE — Progress Notes (Signed)
Pt in for anesthesia consult. Evaluated by Dr. Gifford Shave, [facility] for surgery with Dr. Hassell Done 09/03/16.

## 2016-09-01 ENCOUNTER — Ambulatory Visit
Admission: RE | Admit: 2016-09-01 | Discharge: 2016-09-01 | Disposition: A | Discharge status: Home / Self Care | Payer: BC Managed Care – PPO | Source: Ambulatory Visit | Attending: Surgery | Admitting: Surgery

## 2016-09-01 DIAGNOSIS — N6320 Unspecified lump in the left breast, unspecified quadrant: Secondary | ICD-10-CM

## 2016-09-03 ENCOUNTER — Ambulatory Visit (HOSPITAL_BASED_OUTPATIENT_CLINIC_OR_DEPARTMENT_OTHER): Payer: BC Managed Care – PPO | Admitting: Anesthesiology

## 2016-09-03 ENCOUNTER — Encounter (HOSPITAL_BASED_OUTPATIENT_CLINIC_OR_DEPARTMENT_OTHER): Payer: Self-pay | Admitting: Anesthesiology

## 2016-09-03 ENCOUNTER — Encounter (HOSPITAL_BASED_OUTPATIENT_CLINIC_OR_DEPARTMENT_OTHER)
Admission: RE | Disposition: A | Discharge status: Home / Self Care | Payer: Self-pay | Source: Ambulatory Visit | Attending: Surgery

## 2016-09-03 ENCOUNTER — Ambulatory Visit
Admission: RE | Admit: 2016-09-03 | Discharge: 2016-09-03 | Disposition: A | Discharge status: Home / Self Care | Payer: BC Managed Care – PPO | Source: Ambulatory Visit | Attending: Surgery | Admitting: Surgery

## 2016-09-03 ENCOUNTER — Ambulatory Visit (HOSPITAL_BASED_OUTPATIENT_CLINIC_OR_DEPARTMENT_OTHER)
Admission: RE | Admit: 2016-09-03 | Discharge: 2016-09-03 | Disposition: A | Discharge status: Home / Self Care | Payer: BC Managed Care – PPO | Source: Ambulatory Visit | Attending: Surgery | Admitting: Surgery

## 2016-09-03 DIAGNOSIS — F418 Other specified anxiety disorders: Secondary | ICD-10-CM | POA: Insufficient documentation

## 2016-09-03 DIAGNOSIS — Z79899 Other long term (current) drug therapy: Secondary | ICD-10-CM | POA: Diagnosis not present

## 2016-09-03 DIAGNOSIS — Z9884 Bariatric surgery status: Secondary | ICD-10-CM | POA: Insufficient documentation

## 2016-09-03 DIAGNOSIS — D4862 Neoplasm of uncertain behavior of left breast: Secondary | ICD-10-CM | POA: Diagnosis not present

## 2016-09-03 DIAGNOSIS — N6320 Unspecified lump in the left breast, unspecified quadrant: Secondary | ICD-10-CM

## 2016-09-03 HISTORY — DX: Other complications of anesthesia, initial encounter: T88.59XA

## 2016-09-03 HISTORY — DX: Microstomia: Q18.5

## 2016-09-03 HISTORY — DX: Other asthma: J45.998

## 2016-09-03 HISTORY — DX: Unspecified osteoarthritis, unspecified site: M19.90

## 2016-09-03 HISTORY — DX: Anemia, unspecified: D64.9

## 2016-09-03 HISTORY — DX: Adverse effect of unspecified anesthetic, initial encounter: T41.45XA

## 2016-09-03 HISTORY — DX: Dental restoration status: Z98.811

## 2016-09-03 HISTORY — DX: Unspecified lump in the left breast, unspecified quadrant: N63.20

## 2016-09-03 HISTORY — PX: RADIOACTIVE SEED GUIDED EXCISIONAL BREAST BIOPSY: SHX6490

## 2016-09-03 SURGERY — RADIOACTIVE SEED GUIDED BREAST BIOPSY
Anesthesia: General | Site: Breast | Laterality: Left

## 2016-09-03 MED ORDER — ONDANSETRON HCL 4 MG/2ML IJ SOLN
INTRAMUSCULAR | Status: DC | PRN
  Administered 2016-09-03: 4 mg via INTRAVENOUS

## 2016-09-03 MED ORDER — GABAPENTIN 300 MG PO CAPS
300.0000 mg | ORAL_CAPSULE | ORAL | Status: AC
  Administered 2016-09-03: 300 mg via ORAL

## 2016-09-03 MED ORDER — CHLORHEXIDINE GLUCONATE CLOTH 2 % EX PADS: 6.0000 | MEDICATED_PAD | Freq: Once | CUTANEOUS | Status: DC

## 2016-09-03 MED ORDER — BUPIVACAINE HCL (PF) 0.25 % IJ SOLN
INTRAMUSCULAR | Status: DC | PRN
  Administered 2016-09-03: 10 mL

## 2016-09-03 MED ORDER — LIDOCAINE HCL (CARDIAC) 20 MG/ML IV SOLN
INTRAVENOUS | Status: DC | PRN
  Administered 2016-09-03: 30 mg via INTRAVENOUS

## 2016-09-03 MED ORDER — CEFAZOLIN SODIUM-DEXTROSE 2-4 GM/100ML-% IV SOLN: 2.0000 g | INTRAVENOUS | Status: DC

## 2016-09-03 MED ORDER — FENTANYL CITRATE (PF) 100 MCG/2ML IJ SOLN
INTRAMUSCULAR | Status: AC
  Filled 2016-09-03: qty 2

## 2016-09-03 MED ORDER — SODIUM CHLORIDE 0.9 % IJ SOLN
INTRAMUSCULAR | Status: AC
  Filled 2016-09-03: qty 10

## 2016-09-03 MED ORDER — BUPIVACAINE HCL (PF) 0.25 % IJ SOLN
INTRAMUSCULAR | Status: AC
  Filled 2016-09-03: qty 30

## 2016-09-03 MED ORDER — ACETAMINOPHEN 500 MG PO TABS
ORAL_TABLET | ORAL | Status: AC
  Filled 2016-09-03: qty 2

## 2016-09-03 MED ORDER — CHLORHEXIDINE GLUCONATE 4 % EX LIQD: 60.0000 mL | Freq: Once | CUTANEOUS | Status: DC

## 2016-09-03 MED ORDER — PROPOFOL 10 MG/ML IV BOLUS
INTRAVENOUS | Status: AC
  Filled 2016-09-03: qty 20

## 2016-09-03 MED ORDER — MIDAZOLAM HCL 5 MG/5ML IJ SOLN
INTRAMUSCULAR | Status: DC | PRN
  Administered 2016-09-03: 2 mg via INTRAVENOUS

## 2016-09-03 MED ORDER — GLYCOPYRROLATE 0.2 MG/ML IJ SOLN
INTRAMUSCULAR | Status: DC | PRN
  Administered 2016-09-03: 0.2 mg via INTRAVENOUS

## 2016-09-03 MED ORDER — CELECOXIB 400 MG PO CAPS: 400.0000 mg | ORAL_CAPSULE | ORAL | Status: DC

## 2016-09-03 MED ORDER — ACETAMINOPHEN 500 MG PO TABS
1000.0000 mg | ORAL_TABLET | ORAL | Status: AC
  Administered 2016-09-03: 1000 mg via ORAL

## 2016-09-03 MED ORDER — FENTANYL CITRATE (PF) 100 MCG/2ML IJ SOLN
INTRAMUSCULAR | Status: DC | PRN
  Administered 2016-09-03: 50 ug via INTRAVENOUS
  Administered 2016-09-03: 100 ug via INTRAVENOUS

## 2016-09-03 MED ORDER — OXYCODONE HCL 5 MG PO TABS: 5.0000 mg | ORAL_TABLET | Freq: Once | ORAL | Status: DC | PRN

## 2016-09-03 MED ORDER — DEXAMETHASONE SODIUM PHOSPHATE 10 MG/ML IJ SOLN
INTRAMUSCULAR | Status: AC
  Filled 2016-09-03: qty 1

## 2016-09-03 MED ORDER — METHYLENE BLUE 0.5 % INJ SOLN
INTRAVENOUS | Status: AC
Start: 2016-09-03 — End: 2016-09-03
  Filled 2016-09-03: qty 10

## 2016-09-03 MED ORDER — HEPARIN SODIUM (PORCINE) 5000 UNIT/ML IJ SOLN
5000.0000 [IU] | Freq: Once | INTRAMUSCULAR | Status: AC
  Administered 2016-09-03: 5000 [IU] via SUBCUTANEOUS

## 2016-09-03 MED ORDER — HEPARIN SODIUM (PORCINE) 5000 UNIT/ML IJ SOLN: 5000.0000 [IU] | Freq: Once | INTRAMUSCULAR | Status: DC

## 2016-09-03 MED ORDER — MIDAZOLAM HCL 2 MG/2ML IJ SOLN: 1.0000 mg | INTRAMUSCULAR | Status: DC | PRN

## 2016-09-03 MED ORDER — DEXTROSE 5 % IV SOLN
3.0000 g | Freq: Once | INTRAVENOUS | Status: AC
  Administered 2016-09-03: 3 g via INTRAVENOUS

## 2016-09-03 MED ORDER — PROPOFOL 10 MG/ML IV BOLUS
INTRAVENOUS | Status: DC | PRN
  Administered 2016-09-03: 50 mg via INTRAVENOUS
  Administered 2016-09-03: 100 mg via INTRAVENOUS
  Administered 2016-09-03: 50 mg via INTRAVENOUS
  Administered 2016-09-03: 200 mg via INTRAVENOUS
  Administered 2016-09-03: 50 mg via INTRAVENOUS

## 2016-09-03 MED ORDER — FENTANYL CITRATE (PF) 100 MCG/2ML IJ SOLN: 50.0000 ug | INTRAMUSCULAR | Status: DC | PRN

## 2016-09-03 MED ORDER — LIDOCAINE 2% (20 MG/ML) 5 ML SYRINGE
INTRAMUSCULAR | Status: AC
  Filled 2016-09-03: qty 5

## 2016-09-03 MED ORDER — DEXAMETHASONE SODIUM PHOSPHATE 4 MG/ML IJ SOLN
INTRAMUSCULAR | Status: DC | PRN
  Administered 2016-09-03: 10 mg via INTRAVENOUS

## 2016-09-03 MED ORDER — CEFAZOLIN SODIUM-DEXTROSE 2-4 GM/100ML-% IV SOLN
INTRAVENOUS | Status: AC
  Filled 2016-09-03: qty 100

## 2016-09-03 MED ORDER — LACTATED RINGERS IV SOLN
INTRAVENOUS | Status: DC
  Administered 2016-09-03 (×2): via INTRAVENOUS

## 2016-09-03 MED ORDER — EPHEDRINE SULFATE 50 MG/ML IJ SOLN
INTRAMUSCULAR | Status: DC | PRN
  Administered 2016-09-03: 10 mg via INTRAVENOUS

## 2016-09-03 MED ORDER — OXYCODONE HCL 5 MG/5ML PO SOLN: 5.0000 mg | ORAL | 0 refills | Status: DC | PRN

## 2016-09-03 MED ORDER — FENTANYL CITRATE (PF) 100 MCG/2ML IJ SOLN
25.0000 ug | INTRAMUSCULAR | Status: DC | PRN
  Administered 2016-09-03: 25 ug via INTRAVENOUS

## 2016-09-03 MED ORDER — GLYCOPYRROLATE 0.2 MG/ML IV SOSY
PREFILLED_SYRINGE | INTRAVENOUS | Status: AC
  Filled 2016-09-03: qty 3

## 2016-09-03 MED ORDER — ONDANSETRON HCL 4 MG/2ML IJ SOLN: 4.0000 mg | Freq: Four times a day (QID) | INTRAMUSCULAR | Status: DC | PRN

## 2016-09-03 MED ORDER — GABAPENTIN 300 MG PO CAPS
ORAL_CAPSULE | ORAL | Status: AC
  Filled 2016-09-03: qty 1

## 2016-09-03 MED ORDER — GABAPENTIN 300 MG PO CAPS: 300.0000 mg | ORAL_CAPSULE | ORAL | Status: DC

## 2016-09-03 MED ORDER — ONDANSETRON HCL 4 MG/2ML IJ SOLN
INTRAMUSCULAR | Status: AC
  Filled 2016-09-03: qty 2

## 2016-09-03 MED ORDER — ACETAMINOPHEN 500 MG PO TABS: 1000.0000 mg | ORAL_TABLET | ORAL | Status: DC

## 2016-09-03 MED ORDER — HEPARIN SODIUM (PORCINE) 5000 UNIT/ML IJ SOLN
INTRAMUSCULAR | Status: AC
  Filled 2016-09-03: qty 1

## 2016-09-03 MED ORDER — OXYCODONE HCL 5 MG/5ML PO SOLN: 5.0000 mg | Freq: Once | ORAL | Status: DC | PRN

## 2016-09-03 MED ORDER — EPHEDRINE 5 MG/ML INJ
INTRAVENOUS | Status: AC
  Filled 2016-09-03: qty 10

## 2016-09-03 MED ORDER — SCOPOLAMINE 1 MG/3DAYS TD PT72: 1.0000 | MEDICATED_PATCH | Freq: Once | TRANSDERMAL | Status: DC | PRN

## 2016-09-03 MED ORDER — MIDAZOLAM HCL 2 MG/2ML IJ SOLN
INTRAMUSCULAR | Status: AC
  Filled 2016-09-03: qty 2

## 2016-09-03 MED ORDER — CEFAZOLIN IN D5W 1 GM/50ML IV SOLN
INTRAVENOUS | Status: AC
  Filled 2016-09-03: qty 50

## 2016-09-03 SURGICAL SUPPLY — 44 items
BINDER BREAST 3XL (BIND) ×2 IMPLANT
BINDER BREAST LRG (GAUZE/BANDAGES/DRESSINGS) IMPLANT
BINDER BREAST XXLRG (GAUZE/BANDAGES/DRESSINGS) IMPLANT
BLADE HEX COATED 2.75 (ELECTRODE) ×2 IMPLANT
BLADE SURG 10 STRL SS (BLADE) ×2 IMPLANT
BLADE SURG 15 STRL LF DISP TIS (BLADE) ×1 IMPLANT
BLADE SURG 15 STRL SS (BLADE) ×1
CANISTER SUCT 1200ML W/VALVE (MISCELLANEOUS) ×2 IMPLANT
COVER BACK TABLE 60X90IN (DRAPES) ×2 IMPLANT
COVER MAYO STAND STRL (DRAPES) ×2 IMPLANT
COVER PROBE W GEL 5X96 (DRAPES) ×2 IMPLANT
DERMABOND ADVANCED (GAUZE/BANDAGES/DRESSINGS) ×1
DERMABOND ADVANCED .7 DNX12 (GAUZE/BANDAGES/DRESSINGS) ×1 IMPLANT
DEVICE DUBIN W/COMP PLATE 8390 (MISCELLANEOUS) ×2 IMPLANT
DRAPE LAPAROTOMY 100X72 PEDS (DRAPES) ×2 IMPLANT
ELECT COATED BLADE 2.86 ST (ELECTRODE) ×2 IMPLANT
ELECT REM PT RETURN 9FT ADLT (ELECTROSURGICAL) ×2
ELECTRODE REM PT RTRN 9FT ADLT (ELECTROSURGICAL) ×1 IMPLANT
GLOVE BIO SURGEON STRL SZ7 (GLOVE) ×2 IMPLANT
GLOVE BIO SURGEON STRL SZ8 (GLOVE) ×2 IMPLANT
GLOVE BIOGEL PI IND STRL 7.5 (GLOVE) ×1 IMPLANT
GLOVE BIOGEL PI IND STRL 8 (GLOVE) ×1 IMPLANT
GLOVE BIOGEL PI INDICATOR 7.5 (GLOVE) ×1
GLOVE BIOGEL PI INDICATOR 8 (GLOVE) ×1
GOWN STRL REUS W/ TWL LRG LVL3 (GOWN DISPOSABLE) ×1 IMPLANT
GOWN STRL REUS W/ TWL XL LVL3 (GOWN DISPOSABLE) ×1 IMPLANT
GOWN STRL REUS W/TWL LRG LVL3 (GOWN DISPOSABLE) ×1
GOWN STRL REUS W/TWL XL LVL3 (GOWN DISPOSABLE) ×1
KIT MARKER MARGIN INK (KITS) ×2 IMPLANT
NEEDLE HYPO 25X1 1.5 SAFETY (NEEDLE) ×2 IMPLANT
NS IRRIG 1000ML POUR BTL (IV SOLUTION) ×2 IMPLANT
PACK BASIN DAY SURGERY FS (CUSTOM PROCEDURE TRAY) ×2 IMPLANT
PENCIL BUTTON HOLSTER BLD 10FT (ELECTRODE) ×2 IMPLANT
SCRUB TECHNI CARE 4 OZ NO DYE (MISCELLANEOUS) ×2 IMPLANT
SHEET MEDIUM DRAPE 40X70 STRL (DRAPES) ×2 IMPLANT
SLEEVE SCD COMPRESS KNEE MED (MISCELLANEOUS) ×2 IMPLANT
SPONGE LAP 18X18 X RAY DECT (DISPOSABLE) ×2 IMPLANT
SUT MNCRL AB 4-0 PS2 18 (SUTURE) ×2 IMPLANT
SUT VICRYL 3-0 CR8 SH (SUTURE) ×2 IMPLANT
SYR CONTROL 10ML LL (SYRINGE) ×2 IMPLANT
TOWEL OR 17X24 6PK STRL BLUE (TOWEL DISPOSABLE) ×2 IMPLANT
TOWEL OR NON WOVEN STRL DISP B (DISPOSABLE) ×2 IMPLANT
TUBE CONNECTING 20X1/4 (TUBING) ×2 IMPLANT
YANKAUER SUCT BULB TIP NO VENT (SUCTIONS) ×2 IMPLANT

## 2016-09-03 NOTE — Transfer of Care (Signed)
Immediate Anesthesia Transfer of Care Note  Patient: Mindy Reed  Procedure(s) Performed: Procedure(s): LEFT RADIOACTIVE SEED GUIDED EXCISIONAL BREAST BIOPSY (Left)  Patient Location: PACU  Anesthesia Type:General  Level of Consciousness: awake  Airway & Oxygen Therapy: Patient Spontanous Breathing and Patient connected to face mask oxygen  Post-op Assessment: Report given to RN and Post -op Vital signs reviewed and stable  Post vital signs: Reviewed and stable  Last Vitals:  Vitals:   09/03/16 0845  BP: 122/70  Resp: 18  Temp: 36.7 C    Last Pain:  Vitals:   09/03/16 0845  TempSrc: Oral  PainSc: 2       Patients Stated Pain Goal: 1 ([id number])  Complications: No apparent anesthesia complications

## 2016-09-03 NOTE — Anesthesia Procedure Notes (Signed)
Procedure Name: LMA Insertion Date/Time: 09/03/2016 10:49 AM Performed by: Toula Moos L Pre-anesthesia Checklist: Patient identified, Emergency Drugs available, Suction available, Patient being monitored and Timeout performed Patient Re-evaluated:Patient Re-evaluated prior to inductionOxygen Delivery Method: Circle system utilized Preoxygenation: Pre-oxygenation with 100% oxygen Intubation Type: IV induction Ventilation: Mask ventilation without difficulty LMA: LMA inserted LMA Size: 4.0 Number of attempts: 1 Airway Equipment and Method: Bite block Placement Confirmation: positive ETCO2 Tube secured with: Tape Dental Injury: Teeth and Oropharynx as per pre-operative assessment

## 2016-09-03 NOTE — Interval H&P Note (Signed)
History and Physical Interval Note:  09/03/2016 10:30 AM  Mindy Reed  has presented today for surgery, with the diagnosis of SPINDLE CELL MASS OF LEFT BREAST  The various methods of treatment have been discussed with the patient and family. After consideration of risks, benefits and other options for treatment, the patient has consented to  Procedure(s): LEFT RADIOACTIVE SEED GUIDED EXCISIONAL BREAST BIOPSY (Left) as a surgical intervention .  The patient's history has been reviewed, patient examined, no change in status, stable for surgery.  I have reviewed the patient's chart and labs.  Questions were answered to the patient's satisfaction.     Cherril Hett B

## 2016-09-03 NOTE — Anesthesia Postprocedure Evaluation (Signed)
Anesthesia Post Note  Patient: Mindy Reed  Procedure(s) Performed: Procedure(s) (LRB): LEFT RADIOACTIVE SEED GUIDED EXCISIONAL BREAST BIOPSY (Left)  Patient location during evaluation: PACU Anesthesia Type: General Level of consciousness: awake and alert and patient cooperative Pain management: pain level controlled Vital Signs Assessment: post-procedure vital signs reviewed and stable Respiratory status: spontaneous breathing and respiratory function stable Cardiovascular status: stable Anesthetic complications: no    Last Vitals:  Vitals:   09/03/16 1215 09/03/16 1230  BP: 137/77 140/77  Pulse: 71 73  Resp: (!) 9 14  Temp:      Last Pain:  Vitals:   09/03/16 1230  TempSrc:   PainSc: 2                  Jennalee Greaves S

## 2016-09-03 NOTE — Anesthesia Preprocedure Evaluation (Signed)
Anesthesia Evaluation  Patient identified by MRN, date of birth, ID band Patient awake    Reviewed: Allergy & Precautions, H&P , NPO status , Patient's Chart, lab work & pertinent test results  Airway Mallampati: II   Neck ROM: full    Dental   Pulmonary asthma ,    breath sounds clear to auscultation       Cardiovascular hypertension,  Rhythm:regular Rate:Normal     Neuro/Psych PSYCHIATRIC DISORDERS Anxiety Depression    GI/Hepatic GERD  ,  Endo/Other    Renal/GU      Musculoskeletal  (+) Arthritis ,   Abdominal   Peds  Hematology   Anesthesia Other Findings   Reproductive/Obstetrics                             Anesthesia Physical Anesthesia Plan  ASA: II  Anesthesia Plan: General   Post-op Pain Management:    Induction: Intravenous  Airway Management Planned: LMA  Additional Equipment:   Intra-op Plan:   Post-operative Plan:   Informed Consent: I have reviewed the patients History and Physical, chart, labs and discussed the procedure including the risks, benefits and alternatives for the proposed anesthesia with the patient or authorized representative who has indicated his/her understanding and acceptance.     Plan Discussed with: CRNA, Anesthesiologist and Surgeon  Anesthesia Plan Comments:         Anesthesia Quick Evaluation

## 2016-09-03 NOTE — Op Note (Signed)
Surgeon: Kaylyn Lim, MD, FACS  Asst:  Christie Beckers, MD, FACS (proctor)  Anes:  general  Procedure: Radioactive seed localized left breast biopsy  Diagnosis: Spindle cell neoplasm  Complications: none  EBL:   5 cc  Drains: none  Description of Procedure:  The patient was taken to OR 2 at [facility].  After anesthesia was administered and the patient was prepped a timeout was performed.  The breast was mapped with the neoprobe and the hot spot was found at the 7 oclock position on the left areola.  A circumareolar incision was made and using the neoprobe and the bovie, I excised a spherical mass that contained the hot spot.  The mass was removed and marked for margins and placed in the Faxitron which demonstrated the seed, the marker and the specimen.    The wound was infiltrated with lidocaine and a visible vessel was suture ligated with 2-0 vicryl.  The wound was closed in layers with vicryl and Monocryl was placed on the skin with Dermabond.  The patient tolerated the procedure well and was taken to the PACU in stable condition.     Matt B. Hassell Done, [place], [facility], [place]

## 2016-09-03 NOTE — H&P (Signed)
Mindy Reed Location: [place] Office Patient #: [id number] DOB: [birth year] Married / Language: English / Race: White Female   History of Present Illness  The patient is a 35 year old female who presents with a complaint of Breast problems. left breast mass seen on screening mammogram. 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 The mass and a lymph node were biopsied and the breast mass showed a spindle cell proliferation so she is needing an excision. A marker was placed at the site of the biopsy. The lymph node was benign.   I explained radioactive seed localized left breast biopsy. She understands. She has had bilateral hidradenitis in her inframammary creases.    Allergies  Morphine Sulfate (Concentrate) *ANALGESICS - OPIOID*  Medication History Zoloft (50MG  Tablet, Oral) Active. Medications Reconciled ZyrTEC Allergy (10MG  Capsule, Oral) Active. Vitamin C (1000MG  Tablet, Oral) Active. Omega 3 (1000MG  Capsule, Oral) Active. Iron (15MG  Tablet, Oral) Active. Calcium Carbonate Active. Multivitamins (Oral) Active. Biotin 5000 (5MG  Capsule, Oral) Active.  Vitals  Weight: 304.4 lb Height: 67in Body Surface Area: 2.42 m Body Mass Index: 47.68 kg/m  Temp.: 98.65F(Oral)  Pulse: 77 (Regular)  BP: 116/80 (Sitting, Left Arm, Standard)  Physical Exam Chest clear Heart SR Breast Note: She has scars from old hidradenitis. Assessment & Plan Rodman Key B. Hassell Done MD; 07/24/2016 2:56 PM) BREAST MASS IN FEMALE (N63.0) Impression: Seen on screening mammogram. biopsy showed spindle cells. Will schedule for radioactive seed localized left breast biopsy.  Matt B. Hassell Done, MD, FACS

## 2016-09-03 NOTE — Discharge Instructions (Addendum)
May shower and get wet tomorrow Use breast binder as needed for comfort Ice packs today and tomorrow.    Post Anesthesia Home Care Instructions  Activity: Get plenty of rest for the remainder of the day. A responsible adult should stay with you for 24 hours following the procedure.  For the next 24 hours, DO NOT: -Drive a car -Paediatric nurse -Drink alcoholic beverages -Take any medication unless instructed by your physician -Make any legal decisions or sign important papers.  Meals: Start with liquid foods such as gelatin or soup. Progress to regular foods as tolerated. Avoid greasy, spicy, heavy foods. If nausea and/or vomiting occur, drink only clear liquids until the nausea and/or vomiting subsides. Call your physician if vomiting continues.  Special Instructions/Symptoms: Your throat may feel dry or sore from the anesthesia or the breathing tube placed in your throat during surgery. If this causes discomfort, gargle with warm salt water. The discomfort should disappear within 24 hours.  If you had a scopolamine patch placed behind your ear for the management of post- operative nausea and/or vomiting:  1. The medication in the patch is effective for 72 hours, after which it should be removed.  Wrap patch in a tissue and discard in the trash. Wash hands thoroughly with soap and water. 2. You may remove the patch earlier than 72 hours if you experience unpleasant side effects which may include dry mouth, dizziness or visual disturbances. 3. Avoid touching the patch. Wash your hands with soap and water after contact with the patch.

## 2016-09-04 ENCOUNTER — Encounter (HOSPITAL_BASED_OUTPATIENT_CLINIC_OR_DEPARTMENT_OTHER): Payer: Self-pay | Admitting: Surgery

## 2016-09-05 ENCOUNTER — Ambulatory Visit: Payer: BC Managed Care – PPO | Admitting: Dietician

## 2016-09-09 ENCOUNTER — Ambulatory Visit: Payer: BC Managed Care – PPO | Admitting: Dietician

## 2016-09-15 ENCOUNTER — Encounter: Payer: BC Managed Care – PPO | Attending: Surgery | Admitting: Dietician

## 2016-09-15 DIAGNOSIS — Z713 Dietary counseling and surveillance: Secondary | ICD-10-CM | POA: Insufficient documentation

## 2016-09-15 DIAGNOSIS — Z6841 Body Mass Index (BMI) 40.0 and over, adult: Secondary | ICD-10-CM | POA: Insufficient documentation

## 2016-09-15 NOTE — Patient Instructions (Addendum)
Goals:  Continue to surround yourself with positive people  Post your favorite uplifting bible verses around your house  Get back into hobbies/activities that nourish you and promote peace  Experiment with some new, bariatric-friendly recipes  Keep your trigger foods out of the house  Continue to find a balance and "give yourself permission"  Get either Celebrate Multi Complete or Bariatric Advantage multivitamin  Look into seeing a counselor: Jacqulynn Cadet, PhD Address: [address] [place], [place] [address] Phone: [phone]

## 2016-09-15 NOTE — Progress Notes (Signed)
  Follow-up visit:  12.5 months Post-Operative RYGB Surgery  Medical Nutrition Therapy:  Appt start time: 410 end time:  445  Primary concerns today: Post-operative Bariatric Surgery Nutrition Management.  Mindy Reed returns having gained 5 lbs. She recently had a breast lumpectomy that was benign and lymph nodes are clear. She has continued to see Dr. Hassell Done for follow up for both surgeries (breast and bariatric). Has gained a few pounds and feels this is due to stress eating. Has not seen Dr. Ardath Sax lately and is interested in seeing a new mental health professional. States that she "gave herself permission" to enjoy play foods on Thanksgiving. Her blood sugars continue to be stable. Recently had bloodwork and plans to discuss with Dr. Hassell Done this week. Not having any vomiting or diarrhea.   Surgery date: 08/27/15 Surgery type: RYGB Start weight at [facility]: 444 lbs on 06/30/15 (highest weight 480 lbs per patient) Weight today: 308.6 lbs Weight change: 5 lbs gain Total weight lost: 171.4 lbs  TANITA  BODY COMP RESULTS  07/16/15 09/11/15 10/29/15 12/07/15 02/01/16 04/25/16 07/11/16 09/15/16   BMI (kg/m^2) N/A 65.3 59.9 57.4 52.2 48.7 47.5 48.3   Fat Mass (lbs)  251.5 227 205.5 187.5 166.2 145.2 164.4   Fat Free Mass (lbs)  165.5 155.5 161 146 144.6 158.4 144.2   Total Body Water (lbs)  121.0 114 118 107 108.5 118 108.2    Preferred Learning Style:   No preference indicated   Learning Readiness:   Ready  24-hr recall: B (6:30 AM): 1/2 Premier protein drink OR Atkins Lift (15-20g) Snk (9-9:30AM): 1/4 cup cottage cheese with vegetable  L (12-12:30PM): 1-1.5 oz steak and chicken with vegetable (10g) Snk (3-3:30PM): sometimes cottage cheese or cheese if she has an early lunch  D (6-6:30PM): taco seasoned beef with refried beans and cheese (14g) Snk (PM):   Fluid intake: FairLife milk, protein shake, water with sugar free flavoring, Powerade Zero, sugar free vitamin water (80-100 oz per day per  patient) Estimated total protein intake: 80-100 grams per day per patient  Medications: see list Supplementation: taking  CBG monitoring: am and 1-2x throughout the day Average CBG per patient: 98-114 mg/dL fasting Last patient reported A1c: 5.2%  Using straws: no Drinking while eating: some Hair loss: having some regrowth Carbonated beverages: none N/V/D/C: takes Colace daily for constipation Dumping syndrome: migraines and diarrhea from McDonald's  Recent physical activity: walking  Progress Towards Goal(s):  In progress.  Handouts given during visit include:  none   Nutritional Diagnosis:  [place]-3.3 Overweight/obesity related to past poor dietary habits and physical inactivity as evidenced by patient w/ recent RYGB surgery following dietary guidelines for continued weight loss.     Intervention:  Nutrition counseling provided.  Teaching Method Utilized:  Visual Auditory Hands on  Barriers to learning/adherence to lifestyle change: none  Demonstrated degree of understanding via:  Teach Back   Monitoring/Evaluation:  Dietary intake, exercise, and body weight. Follow up in 2 months for 14.5 month post-op visit.

## 2016-09-17 ENCOUNTER — Encounter: Payer: Self-pay | Admitting: Dietician

## 2016-11-17 ENCOUNTER — Ambulatory Visit: Payer: BC Managed Care – PPO | Admitting: Dietician

## 2017-01-27 ENCOUNTER — Other Ambulatory Visit: Payer: Self-pay | Admitting: Surgery

## 2017-01-27 DIAGNOSIS — D4860 Neoplasm of uncertain behavior of unspecified breast: Secondary | ICD-10-CM

## 2017-04-03 ENCOUNTER — Other Ambulatory Visit: Payer: Self-pay | Admitting: Surgery

## 2017-04-03 ENCOUNTER — Ambulatory Visit
Admission: RE | Admit: 2017-04-03 | Discharge: 2017-04-03 | Disposition: A | Discharge status: Home / Self Care | Payer: BC Managed Care – PPO | Source: Ambulatory Visit | Attending: Surgery | Admitting: Surgery

## 2017-04-03 DIAGNOSIS — D4860 Neoplasm of uncertain behavior of unspecified breast: Secondary | ICD-10-CM

## 2017-05-14 ENCOUNTER — Other Ambulatory Visit: Payer: Self-pay | Admitting: Obstetrics and Gynecology

## 2017-05-14 DIAGNOSIS — D493 Neoplasm of unspecified behavior of breast: Secondary | ICD-10-CM

## 2017-08-03 ENCOUNTER — Other Ambulatory Visit: Payer: Self-pay | Admitting: Surgery

## 2017-08-03 ENCOUNTER — Ambulatory Visit
Admission: RE | Admit: 2017-08-03 | Discharge: 2017-08-03 | Disposition: A | Discharge status: Home / Self Care | Payer: BC Managed Care – PPO | Source: Ambulatory Visit | Attending: Obstetrics and Gynecology | Admitting: Obstetrics and Gynecology

## 2017-08-03 DIAGNOSIS — D493 Neoplasm of unspecified behavior of breast: Secondary | ICD-10-CM

## 2017-08-03 DIAGNOSIS — Z9884 Bariatric surgery status: Secondary | ICD-10-CM

## 2017-08-12 ENCOUNTER — Encounter (HOSPITAL_COMMUNITY): Payer: Self-pay | Admitting: *Deleted

## 2017-08-13 ENCOUNTER — Ambulatory Visit: Payer: Self-pay | Admitting: Surgery

## 2017-08-13 NOTE — H&P (Signed)
Mindy Reed 07/29/2017 2:30 PM Location: [place] Patient #: [id number] DOB: 1981-05-28 Married / Language: English / Race: White Female   History of Present Illness Rodman Key B. Hassell Done MD; 07/29/2017 3:38 PM) Patient words: New onset of upper abdominal pain with some nausea last week. Some burning pains. Prilosec has helped. This was added 07/24/17 after she called the office. Will get an upper GI series to look at her pouch. We'll also give her Carafate slurry to see if that will help in addition to her Prilosec. She's having her follow-up mammogram next week. Thank you for wait and see her in 4 weeks we'll see if she is better and can discuss her sleeve issues as well as follow-up mammogram.  She is getting along better with her focus and follow-through taking the Adderall.  The patient is a 36 year old female.   Allergies Malachi Bonds, CMA; 07/29/2017 2:31 PM) Morphine Sulfate (Concentrate) *ANALGESICS - OPIOID*  NSAIDs   Medication History (Chemira Jones, CMA; 07/29/2017 2:32 PM) Omeprazole (20MG  Capsule DR, 1 (one) Capsule Oral daily, Taken starting 07/24/2017) Active. Adderall (20MG  Tablet, Oral) Active. ZyrTEC Allergy (10MG  Capsule, Oral) Active. Vitamin C (1000MG  Tablet, Oral) Active. Iron (15MG  Tablet, Oral) Active. Calcium Carbonate Active. Multivitamins (Oral) Active. Biotin 5000 (5MG  Capsule, Oral) Active. Cymbalta (30MG  Capsule DR Part, Oral) Active. Medications Reconciled  Vitals (Chemira Jones CMA; 07/29/2017 2:31 PM) 07/29/2017 2:31 PM Weight: 349.6 lb Height: 67in Body Surface Area: 2.56 m Body Mass Index: 54.75 kg/m  Pulse: 64 (Regular)  BP: 130/80 (Sitting, Left Arm, Standard)       Physical Exam (Vedder Brittian B. Hassell Done MD; 07/29/2017 3:38 PM) Abdomen Note: no focal tenderness at this time.     Assessment & Plan Rodman Key B. 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  Rule out marginal ulcer  Upper endoscopy  Matt B. Hassell Done, MD, FACS

## 2017-08-17 ENCOUNTER — Ambulatory Visit (HOSPITAL_COMMUNITY): Payer: BC Managed Care – PPO | Admitting: Certified Registered"

## 2017-08-17 ENCOUNTER — Ambulatory Visit (HOSPITAL_COMMUNITY)
Admission: RE | Admit: 2017-08-17 | Discharge: 2017-08-17 | Disposition: A | Discharge status: Home / Self Care | Payer: BC Managed Care – PPO | Source: Ambulatory Visit | Attending: Surgery | Admitting: Surgery

## 2017-08-17 ENCOUNTER — Encounter (HOSPITAL_COMMUNITY): Payer: Self-pay | Admitting: *Deleted

## 2017-08-17 ENCOUNTER — Encounter (HOSPITAL_COMMUNITY)
Admission: RE | Disposition: A | Discharge status: Home / Self Care | Payer: Self-pay | Source: Ambulatory Visit | Attending: Surgery

## 2017-08-17 DIAGNOSIS — Z833 Family history of diabetes mellitus: Secondary | ICD-10-CM | POA: Diagnosis not present

## 2017-08-17 DIAGNOSIS — Z6841 Body Mass Index (BMI) 40.0 and over, adult: Secondary | ICD-10-CM | POA: Insufficient documentation

## 2017-08-17 DIAGNOSIS — F419 Anxiety disorder, unspecified: Secondary | ICD-10-CM | POA: Diagnosis not present

## 2017-08-17 DIAGNOSIS — Z9049 Acquired absence of other specified parts of digestive tract: Secondary | ICD-10-CM | POA: Diagnosis not present

## 2017-08-17 DIAGNOSIS — I10 Essential (primary) hypertension: Secondary | ICD-10-CM | POA: Diagnosis not present

## 2017-08-17 DIAGNOSIS — R1013 Epigastric pain: Secondary | ICD-10-CM | POA: Insufficient documentation

## 2017-08-17 DIAGNOSIS — M19071 Primary osteoarthritis, right ankle and foot: Secondary | ICD-10-CM | POA: Diagnosis not present

## 2017-08-17 DIAGNOSIS — J45998 Other asthma: Secondary | ICD-10-CM | POA: Diagnosis not present

## 2017-08-17 DIAGNOSIS — Z7982 Long term (current) use of aspirin: Secondary | ICD-10-CM | POA: Insufficient documentation

## 2017-08-17 DIAGNOSIS — D649 Anemia, unspecified: Secondary | ICD-10-CM | POA: Insufficient documentation

## 2017-08-17 DIAGNOSIS — E119 Type 2 diabetes mellitus without complications: Secondary | ICD-10-CM | POA: Diagnosis not present

## 2017-08-17 DIAGNOSIS — K219 Gastro-esophageal reflux disease without esophagitis: Secondary | ICD-10-CM | POA: Insufficient documentation

## 2017-08-17 DIAGNOSIS — Z8249 Family history of ischemic heart disease and other diseases of the circulatory system: Secondary | ICD-10-CM | POA: Diagnosis not present

## 2017-08-17 DIAGNOSIS — Z9884 Bariatric surgery status: Secondary | ICD-10-CM | POA: Insufficient documentation

## 2017-08-17 DIAGNOSIS — F329 Major depressive disorder, single episode, unspecified: Secondary | ICD-10-CM | POA: Diagnosis not present

## 2017-08-17 DIAGNOSIS — Z8261 Family history of arthritis: Secondary | ICD-10-CM | POA: Insufficient documentation

## 2017-08-17 HISTORY — PX: ESOPHAGOGASTRODUODENOSCOPY (EGD) WITH PROPOFOL: SHX5813

## 2017-08-17 SURGERY — ESOPHAGOGASTRODUODENOSCOPY (EGD) WITH PROPOFOL
Anesthesia: Monitor Anesthesia Care

## 2017-08-17 MED ORDER — LACTATED RINGERS IV SOLN
INTRAVENOUS | Status: DC
  Administered 2017-08-17: 12:00:00 via INTRAVENOUS

## 2017-08-17 MED ORDER — SUCRALFATE 1 GM/10ML PO SUSP: 1.0000 g | Freq: Four times a day (QID) | ORAL | 1 refills | Status: DC

## 2017-08-17 MED ORDER — LIDOCAINE 2% (20 MG/ML) 5 ML SYRINGE
INTRAMUSCULAR | Status: AC
  Filled 2017-08-17: qty 5

## 2017-08-17 MED ORDER — PROPOFOL 500 MG/50ML IV EMUL
INTRAVENOUS | Status: DC | PRN
  Administered 2017-08-17: 100 ug/kg/min via INTRAVENOUS

## 2017-08-17 MED ORDER — PROPOFOL 10 MG/ML IV BOLUS
INTRAVENOUS | Status: DC | PRN
  Administered 2017-08-17 (×2): 50 mg via INTRAVENOUS

## 2017-08-17 MED ORDER — PROPOFOL 10 MG/ML IV BOLUS
INTRAVENOUS | Status: AC
  Filled 2017-08-17: qty 40

## 2017-08-17 MED ORDER — SUGAMMADEX SODIUM 200 MG/2ML IV SOLN
INTRAVENOUS | Status: AC
  Filled 2017-08-17: qty 2

## 2017-08-17 MED ORDER — LIDOCAINE 2% (20 MG/ML) 5 ML SYRINGE
INTRAMUSCULAR | Status: DC | PRN
  Administered 2017-08-17: 50 mg via INTRAVENOUS

## 2017-08-17 NOTE — Op Note (Signed)
Surgeon: Kaylyn Lim, MD, FACS  Asst:  none  Anes:  MAC  Preop Dx: Post gastric bypass pain Postop Dx: No marginal ulcer seen  Procedure: Upper endoscopy (esophagogastrojejuonoscopy) Location Surgery: [facility] Complications: none  EBL:   none cc  Drains: none  Description of Procedure:  The patient was taken to [facility] endoscopy .  After anesthesia was administered and the patient was prepped a timeout was performed.  The endoscope was passed without difficulty into the esophagus and into the gastric pouch and in to the efferent limb and candy cane.  Two staples (black) were seen on either side of the anastomosis but no marginal ulcer was noted.  Pouch small and looked healthy.  Scope withdrawn after decompression  The patient tolerated the procedure well and was taken to the PACU in stable condition.     Matt B. Hassell Done, [place], [facility], [place]

## 2017-08-17 NOTE — Anesthesia Preprocedure Evaluation (Addendum)
Anesthesia Evaluation  Patient identified by MRN, date of birth, ID band Patient awake    Reviewed: Allergy & Precautions, NPO status , Patient's Chart, lab work & pertinent test results  Airway Mallampati: I  TM Distance: >3 FB Neck ROM: Full    Dental  (+) Teeth Intact, Dental Advisory Given   Pulmonary asthma ,    breath sounds clear to auscultation       Cardiovascular hypertension,  Rhythm:Regular Rate:Normal     Neuro/Psych PSYCHIATRIC DISORDERS Anxiety Depression negative neurological ROS     GI/Hepatic Neg liver ROS, GERD  Medicated,  Endo/Other  diabetes, Type 2  Renal/GU negative Renal ROS     Musculoskeletal  (+) Arthritis ,   Abdominal   Peds  Hematology negative hematology ROS (+)   Anesthesia Other Findings Day of surgery medications reviewed with the patient.  Reproductive/Obstetrics                            Anesthesia Physical Anesthesia Plan  ASA: III  Anesthesia Plan: MAC   Post-op Pain Management:    Induction: Intravenous  PONV Risk Score and Plan: Propofol infusion  Airway Management Planned: Natural Airway  Additional Equipment:   Intra-op Plan:   Post-operative Plan:   Informed Consent: I have reviewed the patients History and Physical, chart, labs and discussed the procedure including the risks, benefits and alternatives for the proposed anesthesia with the patient or authorized representative who has indicated his/her understanding and acceptance.     Plan Discussed with: CRNA  Anesthesia Plan Comments:        Anesthesia Quick Evaluation

## 2017-08-17 NOTE — Discharge Instructions (Signed)
Esophagogastroduodenoscopy, Care After Refer to this sheet in the next few weeks. These instructions provide you with information about caring for yourself after your procedure. Your health care provider may also give you more specific instructions. Your treatment has been planned according to current medical practices, but problems sometimes occur. Call your health care provider if you have any problems or questions after your procedure. What can I expect after the procedure? After the procedure, it is common to have:  A sore throat.  Nausea.  Bloating.  Dizziness.  Fatigue.  Follow these instructions at home:  Do not eat or drink anything until the numbing medicine (local anesthetic) has worn off and your gag reflex has returned. You will know that the local anesthetic has worn off when you can swallow comfortably.  Do not drive for 24 hours if you received a medicine to help you relax (sedative).  If your health care provider took a tissue sample for testing during the procedure, make sure to get your test results. This is your responsibility. Ask your health care provider or the department performing the test when your results will be ready.  Keep all follow-up visits as told by your health care provider. This is important. Contact a health care provider if:  You cannot stop coughing.  You are not urinating.  You are urinating less than usual. Get help right away if:  You have trouble swallowing.  You cannot eat or drink.  You have throat or chest pain that gets worse.  You are dizzy or light-headed.  You faint.  You have nausea or vomiting.  You have chills.  You have a fever.  You have severe abdominal pain.  You have black, tarry, or bloody stools. This information is not intended to replace advice given to you by your health care provider. Make sure you discuss any questions you have with your health care provider. Document Released: 09/22/2012 Document  Revised: 03/13/2016 Document Reviewed: 08/30/2015 Elsevier Interactive Patient Education  2018 [place], Care After These instructions provide you with information about caring for yourself after your procedure. Your health care provider may also give you more specific instructions. Your treatment has been planned according to current medical practices, but problems sometimes occur. Call your health care provider if you have any problems or questions after your procedure. What can I expect after the procedure? After your procedure, it is common to:  Feel sleepy for several hours.  Feel clumsy and have poor balance for several hours.  Feel forgetful about what happened after the procedure.  Have poor judgment for several hours.  Feel nauseous or vomit.  Have a sore throat if you had a breathing tube during the procedure.  Follow these instructions at home: For at least 24 hours after the procedure:   Do not: ? Participate in activities in which you could fall or become injured. ? Drive. ? Use heavy machinery. ? Drink alcohol. ? Take sleeping pills or medicines that cause drowsiness. ? Make important decisions or sign legal documents. ? Take care of children on your own.  Rest. Eating and drinking  Follow the diet that is recommended by your health care provider.  If you vomit, drink water, juice, or soup when you can drink without vomiting.  Make sure you have little or no nausea before eating solid foods. General instructions  Have a responsible adult stay with you until you are awake and alert.  Take over-the-counter and prescription medicines  only as told by your health care provider.  If you smoke, do not smoke without supervision.  Keep all follow-up visits as told by your health care provider. This is important. Contact a health care provider if:  You keep feeling nauseous or you keep vomiting.  You feel  light-headed.  You develop a rash.  You have a fever. Get help right away if:  You have trouble breathing. This information is not intended to replace advice given to you by your health care provider. Make sure you discuss any questions you have with your health care provider. Document Released: 01/27/2016 Document Revised: 05/28/2016 Document Reviewed: 01/27/2016 Elsevier Interactive Patient Education  2018 [place] as prescribed (sent to your pharmacy) and continue the [employer]

## 2017-08-17 NOTE — Anesthesia Postprocedure Evaluation (Signed)
Anesthesia Post Note  Patient: Mindy Reed  Procedure(s) Performed: ESOPHAGOGASTRODUODENOSCOPY (EGD) WITH PROPOFOL (N/A )     Patient location during evaluation: PACU Anesthesia Type: MAC Level of consciousness: awake and alert Pain management: pain level controlled Vital Signs Assessment: post-procedure vital signs reviewed and stable Respiratory status: spontaneous breathing, nonlabored ventilation, respiratory function stable and patient connected to nasal cannula oxygen Cardiovascular status: stable and blood pressure returned to baseline Postop Assessment: no apparent nausea or vomiting Anesthetic complications: no    Last Vitals:  Vitals:   08/17/17 1420 08/17/17 1430  BP: (!) 149/77 138/73  Pulse: 70 (!) 58  Resp: 13 19  Temp:    SpO2: 100% 100%    Last Pain:  Vitals:   08/17/17 1416  TempSrc: Oral                 Effie Berkshire

## 2017-08-17 NOTE — Transfer of Care (Signed)
Immediate Anesthesia Transfer of Care Note  Patient: Mindy Reed  Procedure(s) Performed: ESOPHAGOGASTRODUODENOSCOPY (EGD) WITH PROPOFOL (N/A )  Patient Location: PACU  Anesthesia Type:MAC  Level of Consciousness: awake, alert  and oriented  Airway & Oxygen Therapy: Patient Spontanous Breathing and Patient connected to nasal cannula oxygen  Post-op Assessment: Report given to RN and Post -op Vital signs reviewed and stable  Post vital signs: Reviewed and stable  Last Vitals:  Vitals:   08/17/17 1220  BP: (!) 144/63  Pulse: 68  Resp: 16  Temp: 36.8 C  SpO2: 100%    Last Pain:  Vitals:   08/17/17 1220  TempSrc: Oral         Complications: No apparent anesthesia complications

## 2017-08-17 NOTE — H&P (Signed)
Chief Complaint:  Burning epigastric pain in post bypass patient  History of Present Illness:  Mindy Reed is an 36 y.o. female had gastric bypass Nov 16.  She has had epigastric pain suggestive of a marginal ulcer.  She comes today for upper endoscopy  Past Medical History:  Diagnosis Date  . Abnormally small mouth   . Anemia   . Anxiety   . Arthritis    right ankle  . Complication of anesthesia    was hard to wake up after gastric bypass; states "fought them" during induction for D & C  . Dental crowns present   . Depression   . Diabetes mellitus without complication (HCC)    diet controlled dm  . GERD (gastroesophageal reflux disease)    no meds currently  . Hypertension    off bp meds x 2 years due to gastric bypass  . 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 Seasonal asthma     Past Surgical History:  Procedure Laterality Date  . 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 BREAST EXCISIONAL BIOPSY    . CHOLECYSTECTOMY    . DG BARIUM SWALLOW ([place] [facility]X)  08/27/2015  . FLEXIBLE SIGMOIDOSCOPY  as teenager  . 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 HYSTEROSCOPY W/D&C N/A 07/18/2015   Procedure: DILATATION AND CURETTAGE /HYSTEROSCOPY;  Surgeon: Everlene Farrier, MD;  Location: [facility];  Service: Gynecology;  Laterality: N/A;  . INTRAUTERINE DEVICE (IUD) INSERTION N/A 07/18/2015   Procedure: INTRAUTERINE DEVICE (IUD) INSERTION;  Surgeon: Everlene Farrier, MD;  Location: [facility];  Service: Gynecology;  Laterality: N/A;  . LAPAROSCOPIC ROUX-EN-Y GASTRIC BYPASS WITH HIATAL HERNIA REPAIR  08/27/2015   Procedure: LAPAROSCOPIC ROUX-EN-Y GASTRIC BYPASS WITH HIATAL HERNIA REPAIR;  Surgeon: Johnathan Hausen, MD;  Location: [facility];  Service: General;;  . ORIF ANKLE FRACTURE Right   . OVARIAN CYST REMOVAL    . RADIOACTIVE SEED GUIDED EXCISIONAL BREAST BIOPSY Left 09/03/2016   Procedure: LEFT RADIOACTIVE SEED GUIDED EXCISIONAL BREAST BIOPSY;   Surgeon: Johnathan Hausen, MD;  Location: [facility];  Service: General;  Laterality: Left;  . TONSILLECTOMY AND ADENOIDECTOMY    . UPPER GI ENDOSCOPY  08/27/2015   Procedure: UPPER GI ENDOSCOPY;  Surgeon: Johnathan Hausen, MD;  Location: [facility];  Service: General;;    Current Facility-Administered Medications  Medication Dose Route Frequency Provider Last Rate Last Dose  . lactated ringers infusion   Intravenous Continuous Johnathan Hausen, MD 20 mL/hr at 08/17/17 1227     Morphine and related; Adhesive [tape]; Aspirin; and Nsaids Family History  Problem Relation Age of Onset  . Osteoarthritis Mother   . Diabetes Mother   . Hypertension Mother   . Migraines Mother   . Osteoarthritis Father   . Diabetes Father   . Heart failure Father   . Hypertension Father    Social History:   reports that she has never smoked. She has never used smokeless tobacco. She reports that she does not drink alcohol or use drugs.   REVIEW OF SYSTEMS : Negative except for see problem list  Physical Exam:   Blood pressure (!) 144/63, pulse 68, temperature 98.2 F (36.8 C), temperature source Oral, resp. rate 16, height 5\' 7"  (1.702 m), weight (!) 156 kg (344 lb), last menstrual period 08/03/2017, SpO2 100 %. Body mass index is 53.88 kg/m.  Gen:  WDWN WF NAD  Neurological: Alert and oriented to person, place, and time. Motor and sensory function is grossly intact  Head: Normocephalic and atraumatic.  Eyes: Conjunctivae are normal. Pupils are equal, round, and reactive to light. No scleral icterus.  Neck: Normal range of motion. Neck supple. No tracheal deviation or thyromegaly present.  Cardiovascular:  SR without murmurs or gallops.  No carotid bruits Breast:  Left medial breast bx Respiratory: Effort normal.  No respiratory distress. No chest wall tenderness. Breath sounds normal.  No wheezes, rales or rhonchi.  Abdomen:  Prior gastric bypassw GU:  Not examined Musculoskeletal: Normal  range of motion. Extremities are nontender. No cyanosis, edema or clubbing noted Lymphadenopathy: No cervical, preauricular, postauricular or axillary adenopathy is present Skin: Skin is warm and dry. No rash noted. No diaphoresis. No erythema. No pallor. Pscyh: Normal mood and affect. Behavior is normal. Judgment and thought content normal.   LABORATORY RESULTS: No results found for this or any previous visit (from the past 48 hour(s)).   RADIOLOGY RESULTS: No results found.  Problem List: Patient Active Problem List   Diagnosis Date Noted  . 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 08/28/2015  . Morbid obesity ([place]) 08/27/2015    Assessment & Plan: Rule out marginal ulcer    Matt B. Hassell Done, MD, [facility] [phone] beeper [phone]  08/17/2017 1:41 PM

## 2017-08-18 ENCOUNTER — Encounter (HOSPITAL_COMMUNITY): Payer: Self-pay | Admitting: Surgery

## 2017-09-20 ENCOUNTER — Inpatient Hospital Stay (HOSPITAL_COMMUNITY): Payer: BC Managed Care – PPO

## 2017-09-20 ENCOUNTER — Encounter (HOSPITAL_COMMUNITY): Payer: Self-pay | Admitting: *Deleted

## 2017-09-20 ENCOUNTER — Other Ambulatory Visit: Payer: Self-pay

## 2017-09-20 ENCOUNTER — Inpatient Hospital Stay (HOSPITAL_COMMUNITY)
Admission: AD | Admit: 2017-09-20 | Discharge: 2017-09-20 | Disposition: A | Discharge status: Home / Self Care | Payer: BC Managed Care – PPO | Source: Ambulatory Visit | Attending: Obstetrics and Gynecology | Admitting: Obstetrics and Gynecology

## 2017-09-20 DIAGNOSIS — Z9889 Other specified postprocedural states: Secondary | ICD-10-CM | POA: Diagnosis not present

## 2017-09-20 DIAGNOSIS — Z79899 Other long term (current) drug therapy: Secondary | ICD-10-CM | POA: Diagnosis not present

## 2017-09-20 DIAGNOSIS — Z9049 Acquired absence of other specified parts of digestive tract: Secondary | ICD-10-CM | POA: Diagnosis not present

## 2017-09-20 DIAGNOSIS — Z886 Allergy status to analgesic agent status: Secondary | ICD-10-CM | POA: Diagnosis not present

## 2017-09-20 DIAGNOSIS — F419 Anxiety disorder, unspecified: Secondary | ICD-10-CM | POA: Diagnosis not present

## 2017-09-20 DIAGNOSIS — Z9884 Bariatric surgery status: Secondary | ICD-10-CM | POA: Insufficient documentation

## 2017-09-20 DIAGNOSIS — Z820 Family history of epilepsy and other diseases of the nervous system: Secondary | ICD-10-CM | POA: Diagnosis not present

## 2017-09-20 DIAGNOSIS — Z3201 Encounter for pregnancy test, result positive: Secondary | ICD-10-CM | POA: Diagnosis not present

## 2017-09-20 DIAGNOSIS — O039 Complete or unspecified spontaneous abortion without complication: Secondary | ICD-10-CM | POA: Insufficient documentation

## 2017-09-20 DIAGNOSIS — O99342 Other mental disorders complicating pregnancy, second trimester: Secondary | ICD-10-CM | POA: Diagnosis not present

## 2017-09-20 DIAGNOSIS — Z3A01 Less than 8 weeks gestation of pregnancy: Secondary | ICD-10-CM | POA: Diagnosis not present

## 2017-09-20 DIAGNOSIS — Z833 Family history of diabetes mellitus: Secondary | ICD-10-CM | POA: Diagnosis not present

## 2017-09-20 DIAGNOSIS — Z91048 Other nonmedicinal substance allergy status: Secondary | ICD-10-CM | POA: Diagnosis not present

## 2017-09-20 DIAGNOSIS — F329 Major depressive disorder, single episode, unspecified: Secondary | ICD-10-CM | POA: Diagnosis not present

## 2017-09-20 DIAGNOSIS — Z8261 Family history of arthritis: Secondary | ICD-10-CM | POA: Insufficient documentation

## 2017-09-20 DIAGNOSIS — O24912 Unspecified diabetes mellitus in pregnancy, second trimester: Secondary | ICD-10-CM | POA: Diagnosis not present

## 2017-09-20 DIAGNOSIS — O209 Hemorrhage in early pregnancy, unspecified: Secondary | ICD-10-CM | POA: Diagnosis present

## 2017-09-20 DIAGNOSIS — Z885 Allergy status to narcotic agent status: Secondary | ICD-10-CM | POA: Insufficient documentation

## 2017-09-20 DIAGNOSIS — O99011 Anemia complicating pregnancy, first trimester: Secondary | ICD-10-CM | POA: Diagnosis not present

## 2017-09-20 DIAGNOSIS — R109 Unspecified abdominal pain: Secondary | ICD-10-CM | POA: Diagnosis present

## 2017-09-20 DIAGNOSIS — Z8249 Family history of ischemic heart disease and other diseases of the circulatory system: Secondary | ICD-10-CM | POA: Insufficient documentation

## 2017-09-20 LAB — CBC
HCT: 41.3 % (ref 36.0–46.0)
HEMOGLOBIN: 13.4 g/dL (ref 12.0–15.0)
MCH: 29 pg (ref 26.0–34.0)
MCHC: 32.4 g/dL (ref 30.0–36.0)
MCV: 89.4 fL (ref 78.0–100.0)
Platelets: 368 10*3/uL (ref 150–400)
RBC: 4.62 MIL/uL (ref 3.87–5.11)
RDW: 13.2 % (ref 11.5–15.5)
WBC: 7.1 10*3/uL (ref 4.0–10.5)

## 2017-09-20 LAB — URINALYSIS, ROUTINE W REFLEX MICROSCOPIC
BILIRUBIN URINE: NEGATIVE
Glucose, UA: NEGATIVE mg/dL
Ketones, ur: 15 mg/dL — AB
Leukocytes, UA: NEGATIVE
Nitrite: POSITIVE — AB
PROTEIN: 30 mg/dL — AB
Specific Gravity, Urine: 1.025 (ref 1.005–1.030)
pH: 5.5 (ref 5.0–8.0)

## 2017-09-20 LAB — URINALYSIS, MICROSCOPIC (REFLEX)

## 2017-09-20 LAB — POCT PREGNANCY, URINE: PREG TEST UR: POSITIVE — AB

## 2017-09-20 LAB — ABO/RH: ABO/RH(D): A POS

## 2017-09-20 MED ORDER — HYDROCODONE-ACETAMINOPHEN 5-325 MG PO TABS: 1.0000 | ORAL_TABLET | ORAL | 0 refills | Status: DC | PRN

## 2017-09-20 NOTE — MAU Provider Note (Signed)
History     CSN: [id number]  Arrival date and time: 09/20/17 1445   First Provider Initiated Contact with Patient 09/20/17 1611      Chief Complaint  Patient presents with  . Abdominal Pain  . Vaginal Bleeding   HPI   Ms.Mindy Reed is a 36 y.o. female G2P1001 @ [redacted]w[redacted]d here in MAU with vaginal bleeding. The bleeding started last Thursday and she was seen in the office by Dr. Gaetano Net. They saw an obstructed right fallopian tube which they thought was maybe a heterotopic pregnancy. She is scheduled for follow up in the office tomorrow.  Says she saw the babies heartbeat on Thursday which showed 124 bpm.  The bleeding is heavy like a period and she attests to passing blood clots. States she has lower abdominal cramping that comes and goes.   OB History    Gravida Para Term Preterm AB Living   2 1 1  0 0 1   SAB TAB Ectopic Multiple Live Births   0 0 0 0 1      Past Medical History:  Diagnosis Date  . Abnormally small mouth   . Anemia   . Anxiety   . Arthritis    right ankle  . Complication of anesthesia    was hard to wake up after gastric bypass; states "fought them" during induction for D & C  . Dental crowns present   . Depression   . Diabetes mellitus without complication (HCC)    diet controlled dm  . GERD (gastroesophageal reflux disease)    no meds currently  . Hypertension    off bp meds x 2 years due to gastric bypass  . 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 Seasonal asthma     Past Surgical History:  Procedure Laterality Date  . 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 BREAST EXCISIONAL BIOPSY    . CHOLECYSTECTOMY    . DG BARIUM SWALLOW ([place] HX)  08/27/2015  . ESOPHAGOGASTRODUODENOSCOPY (EGD) WITH PROPOFOL N/A 08/17/2017   Procedure: ESOPHAGOGASTRODUODENOSCOPY (EGD) WITH PROPOFOL;  Surgeon: Johnathan Hausen, MD;  [facility];  Service: General;  Laterality: N/A;  . FLEXIBLE SIGMOIDOSCOPY  as teenager  . HIGH RISK  BREAST EXCISION     left breast exc bx 2017 desmoid tumor  . HYSTEROSCOPY W/D&C N/A 07/18/2015   Procedure: DILATATION AND CURETTAGE /HYSTEROSCOPY;  Surgeon: Everlene Farrier, MD;  Location: [facility];  Service: Gynecology;  Laterality: N/A;  . INTRAUTERINE DEVICE (IUD) INSERTION N/A 07/18/2015   Procedure: INTRAUTERINE DEVICE (IUD) INSERTION;  Surgeon: Everlene Farrier, MD;  Location: [facility];  Service: Gynecology;  Laterality: N/A;  . LAPAROSCOPIC ROUX-EN-Y GASTRIC BYPASS WITH HIATAL HERNIA REPAIR  08/27/2015   Procedure: LAPAROSCOPIC ROUX-EN-Y GASTRIC BYPASS WITH HIATAL HERNIA REPAIR;  Surgeon: Johnathan Hausen, MD;  [facility];  Service: General;;  . ORIF ANKLE FRACTURE Right   . OVARIAN CYST REMOVAL    . RADIOACTIVE SEED GUIDED EXCISIONAL BREAST BIOPSY Left 09/03/2016   Procedure: LEFT RADIOACTIVE SEED GUIDED EXCISIONAL BREAST BIOPSY;  Surgeon: Johnathan Hausen, MD;  Location: [facility];  Service: General;  Laterality: Left;  . TONSILLECTOMY AND ADENOIDECTOMY    . UPPER GI ENDOSCOPY  08/27/2015   Procedure: UPPER GI ENDOSCOPY;  Surgeon: Johnathan Hausen, MD;  [facility];  Service: General;;    Family History  Problem Relation Age of Onset  . Osteoarthritis Mother   . Diabetes Mother   .  Hypertension Mother   . Migraines Mother   . Osteoarthritis Father   . Diabetes Father   . Heart failure Father   . Hypertension Father     Social History   Tobacco Use  . Smoking status: Never Smoker  . Smokeless tobacco: Never Used  Substance Use Topics  . Alcohol use: No  . Drug use: No    Allergies:  Allergies  Allergen Reactions  . Morphine And Related Hives  . Adhesive [Tape] Rash    PAPER TAPE IS OKAY  . Aspirin Other (See Comments)    HAS HAD GASTRIC BYPASS  . Nsaids Other (See Comments)    HAS HAD GASTRIC BYPASS    Medications Prior to Admission  Medication Sig Dispense Refill Last Dose  . Ascorbic Acid (VITAMIN C) 1000 MG tablet Take 1,000 mg by mouth 2 (two)  times daily.   09/20/2017 at Unknown time  . CALCIUM CITRATE PO Take 500 mg by mouth 2 (two) times daily.    09/19/2017 at Unknown time  . cephALEXin (KEFLEX) 500 MG capsule Take 500 mg by mouth every 6 (six) hours.  0 09/20/2017 at Unknown time  . FERROUS FUMARATE PO Take 1 capsule by mouth 2 (two) times daily.   09/20/2017 at Unknown time  . fexofenadine (ALLEGRA) 180 MG tablet Take 180 mg by mouth every evening.   09/20/2017 at Unknown time  . folic acid (FOLVITE) 814 MCG tablet Take 400 mcg by mouth daily.   09/20/2017 at Unknown time  . Multiple Vitamins-Minerals (MULTIVITAMIN & MINERAL PO) Take 1 tablet by mouth 2 (two) times daily.    09/20/2017 at Unknown time  . omeprazole (PRILOSEC) 20 MG capsule Take 20 mg by mouth every evening.  0 09/19/2017 at Unknown time  . oxymetazoline (AFRIN) 0.05 % nasal spray Place 1 spray into both nostrils 2 (two) times daily.   Past Week at Unknown time  . PROAIR HFA 108 (90 Base) MCG/ACT inhaler Inhale 1-2 puffs into the lungs every 6 (six) hours as needed for wheezing or shortness of breath.    09/19/2017 at Unknown time  . sertraline (ZOLOFT) 100 MG tablet Take 100 mg by mouth daily.   09/20/2017 at Unknown time  . sucralfate (CARAFATE) 1 GM/10ML suspension Take 10 mLs (1 g total) by mouth 4 (four) times daily. (Patient not taking: Reported on 09/20/2017) 420 mL 1 Not Taking at Unknown time  US Ob Comp Less 14 Wks  Result Date: 09/20/2017 CLINICAL DATA:  Vaginal bleeding and cramping. Gestational age by LMP of 7 weeks 4 days. EXAM: OBSTETRIC <14 WK [place] AND TRANSVAGINAL OB US TECHNIQUE: Both transabdominal and transvaginal ultrasound examinations were performed for complete evaluation of the gestation as well as the maternal uterus, adnexal regions, and pelvic cul-de-sac. Transvaginal technique was performed to assess early pregnancy. COMPARISON:  None. FINDINGS: Intrauterine gestational sac: None Maternal uterus/adnexae: Thickened and heterogeneous appearance of  endometrium is seen measuring up to 19 mm. Small amount of fluid in the endocervical canal is most consistent with blood clot. No fibroids identified. Both ovaries are normal in appearance. No adnexal mass or free fluid identified. IMPRESSION: Pregnancy of unknown anatomic location (no intrauterine gestational sac or adnexal mass identified). Endometrial thickening and probable blood clot suggests recent spontaneous abortion, however differential diagnosis still includes IUP too early to visualize, and occult ectopic pregnancy. Recommend correlation with serial beta-hCG levels, and follow up US as clinically warranted. Electronically Signed   By: Sharrie Rothman.D.  On: 09/20/2017 17:40   US Ob Transvaginal  Result Date: 09/20/2017 CLINICAL DATA:  Vaginal bleeding and cramping. Gestational age by LMP of 7 weeks 4 days. EXAM: OBSTETRIC <14 WK [place] AND TRANSVAGINAL OB US TECHNIQUE: Both transabdominal and transvaginal ultrasound examinations were performed for complete evaluation of the gestation as well as the maternal uterus, adnexal regions, and pelvic cul-de-sac. Transvaginal technique was performed to assess early pregnancy. COMPARISON:  None. FINDINGS: Intrauterine gestational sac: None Maternal uterus/adnexae: Thickened and heterogeneous appearance of endometrium is seen measuring up to 19 mm. Small amount of fluid in the endocervical canal is most consistent with blood clot. No fibroids identified. Both ovaries are normal in appearance. No adnexal mass or free fluid identified. IMPRESSION: Pregnancy of unknown anatomic location (no intrauterine gestational sac or adnexal mass identified). Endometrial thickening and probable blood clot suggests recent spontaneous abortion, however differential diagnosis still includes IUP too early to visualize, and occult ectopic pregnancy. Recommend correlation with serial beta-hCG levels, and follow up US as clinically warranted. Electronically Signed   By: Earle Gell M.D.    On: 09/20/2017 17:40    Results for orders placed or performed during the hospital encounter of 09/20/17 (from the past 48 hour(s))  Urinalysis, Routine w reflex microscopic     Status: Abnormal   Collection Time: 09/20/17  3:01 PM  Result Value Ref Range   Color, Urine RED (A) YELLOW    Comment: BIOCHEMICALS MAY BE AFFECTED BY COLOR   APPearance CLOUDY (A) CLEAR   Specific Gravity, Urine 1.025 1.005 - 1.030   pH 5.5 5.0 - 8.0   Glucose, UA NEGATIVE NEGATIVE mg/dL   Hgb urine dipstick LARGE (A) NEGATIVE   Bilirubin Urine NEGATIVE NEGATIVE   Ketones, ur 15 (A) NEGATIVE mg/dL   Protein, ur 30 (A) NEGATIVE mg/dL   Nitrite POSITIVE (A) NEGATIVE   Leukocytes, UA NEGATIVE NEGATIVE  Urinalysis, Microscopic (reflex)     Status: Abnormal   Collection Time: 09/20/17  3:01 PM  Result Value Ref Range   RBC / HPF TOO NUMEROUS TO COUNT 0 - 5 RBC/hpf   WBC, UA 0-5 0 - 5 WBC/hpf   Bacteria, UA RARE (A) NONE SEEN   Squamous Epithelial / LPF 0-5 (A) NONE SEEN   Urine-Other      LESS THAN 2 CC SAMPLE, MICROSCOPIC DONE ON UNCONCENTRATED URINE  Pregnancy, urine POC     Status: Abnormal   Collection Time: 09/20/17  3:26 PM  Result Value Ref Range   Preg Test, Ur POSITIVE (A) NEGATIVE    Comment:        THE SENSITIVITY OF THIS METHODOLOGY IS >24 mIU/mL   CBC     Status: None   Collection Time: 09/20/17  4:45 PM  Result Value Ref Range   WBC 7.1 4.0 - 10.5 K/uL   RBC 4.62 3.87 - 5.11 MIL/uL   Hemoglobin 13.4 12.0 - 15.0 g/dL   HCT 41.3 36.0 - 46.0 %   MCV 89.4 78.0 - 100.0 fL   MCH 29.0 26.0 - 34.0 pg   MCHC 32.4 30.0 - 36.0 g/dL   RDW 13.2 11.5 - 15.5 %   Platelets 368 150 - 400 K/uL  ABO/Rh     Status: None (Preliminary result)   Collection Time: 09/20/17  4:45 PM  Result Value Ref Range   ABO/RH(D) A POS    Review of Systems  Constitutional: Negative for fatigue and fever.  Gastrointestinal: Positive for abdominal pain.  Genitourinary: Positive for vaginal  bleeding.   Physical  Exam   Blood pressure (!) 147/88, pulse 82, temperature 98.4 F (36.9 C), temperature source Oral, resp. rate 16, height 5\' 7"  (1.702 m), weight (!) 339 lb (153.8 kg), last menstrual period 07/29/2017.  Physical Exam  Constitutional: She is oriented to person, place, and time. She appears well-developed and well-nourished. No distress.  HENT:  Head: Normocephalic.  Eyes: Pupils are equal, round, and reactive to light.  Respiratory: Effort normal.  GI: Soft. She exhibits no distension. There is no tenderness. There is no rebound.  Genitourinary:  Genitourinary Comments: Bimanual exam: Cervix closed, posterior  Moderate amount of dark red blood noted on exam glove. Uterus non tender, normal size Adnexa non tender, no masses bilaterally Chaperone present for exam.   Musculoskeletal: Normal range of motion.  Neurological: She is alert and oriented to person, place, and time.  Skin: Skin is warm. She is not diaphoretic.  Psychiatric: Her behavior is normal.   MAU Course  Procedures  None  MDM  A positive blood type CBC Transvaginal US ordered Discussed [place] results with Dr. Helane Rima, Hgb stable. Ok for DC home.  + nitrates likely secondary to hematuria; urine culture collected.   Assessment and Plan   A:  1. SAB (spontaneous abortion)   2. Vaginal bleeding in pregnancy, first trimester     P:  Discharge home with bleeding precautions Follow up tomorrow in the office as scheduled Return to MAU if symptoms worsen Rx: Vicodin Support given  Lezlie Lye, NP 09/20/2017 6:17 PM

## 2017-09-20 NOTE — Discharge Instructions (Signed)

## 2017-09-20 NOTE — MAU Note (Signed)
Patient c/o VB and cramping at [redacted] weeks pregnant, u/s at Dr. Donne Anon office on Thursday. Light spotting earlier today increasing to a heavy period with clots the last few hours.

## 2017-09-20 NOTE — MAU Note (Signed)
Saw Dr. Gaetano Net this past week, having spotting on Thursday, started cramping and bleeding like a heavy period today.

## 2017-09-21 LAB — CULTURE, OB URINE: Culture: NO GROWTH

## 2018-04-01 ENCOUNTER — Institutional Professional Consult (permissible substitution): Payer: Self-pay | Admitting: Neurology

## 2018-07-28 ENCOUNTER — Encounter (HOSPITAL_COMMUNITY): Payer: Self-pay

## 2018-08-25 ENCOUNTER — Institutional Professional Consult (permissible substitution): Payer: Self-pay | Admitting: Neurology

## 2019-01-12 ENCOUNTER — Other Ambulatory Visit: Payer: Self-pay | Admitting: Obstetrics and Gynecology

## 2019-01-12 DIAGNOSIS — N644 Mastodynia: Secondary | ICD-10-CM

## 2019-01-14 ENCOUNTER — Ambulatory Visit: Payer: BC Managed Care – PPO

## 2019-01-14 ENCOUNTER — Ambulatory Visit
Admission: RE | Admit: 2019-01-14 | Discharge: 2019-01-14 | Disposition: A | Discharge status: Home / Self Care | Payer: BC Managed Care – PPO | Source: Ambulatory Visit | Attending: Obstetrics and Gynecology | Admitting: Obstetrics and Gynecology

## 2019-01-14 ENCOUNTER — Other Ambulatory Visit: Payer: BC Managed Care – PPO

## 2019-01-14 ENCOUNTER — Other Ambulatory Visit: Payer: Self-pay

## 2019-01-14 DIAGNOSIS — N644 Mastodynia: Secondary | ICD-10-CM

## 2019-05-16 ENCOUNTER — Ambulatory Visit: Payer: BC Managed Care – PPO | Admitting: Neurology

## 2019-05-16 ENCOUNTER — Encounter: Payer: Self-pay | Admitting: Neurology

## 2019-05-16 ENCOUNTER — Other Ambulatory Visit: Payer: Self-pay

## 2019-05-16 VITALS — BP 135/88 | HR 74 | Ht 67.0 in | Wt 360.0 lb

## 2019-05-16 DIAGNOSIS — R51 Headache: Secondary | ICD-10-CM | POA: Diagnosis not present

## 2019-05-16 DIAGNOSIS — G4719 Other hypersomnia: Secondary | ICD-10-CM

## 2019-05-16 DIAGNOSIS — Z6841 Body Mass Index (BMI) 40.0 and over, adult: Secondary | ICD-10-CM

## 2019-05-16 DIAGNOSIS — Z9884 Bariatric surgery status: Secondary | ICD-10-CM

## 2019-05-16 DIAGNOSIS — R519 Headache, unspecified: Secondary | ICD-10-CM

## 2019-05-16 DIAGNOSIS — R0683 Snoring: Secondary | ICD-10-CM

## 2019-05-16 DIAGNOSIS — Z820 Family history of epilepsy and other diseases of the nervous system: Secondary | ICD-10-CM

## 2019-05-16 NOTE — Progress Notes (Signed)
Subjective:    Patient ID: Mindy Reed is a 38 y.o. female.  HPI     Mindy Age, MD, PhD [facility] [address]O. [place], [place] [address]  Dear Mindy Reed,   I saw your patient, Mindy Reed, upon your kind request in my sleep clinic today for initial consultation of her sleep disorder, in particular, concern for underlying obstructive sleep apnea.  The patient is unaccompanied today.  As you know, Mindy Reed is a 38 year old right-handed woman with an underlying medical history of reflux disease, diabetes, depression, Morbid obesity, status post bariatric surgery, ADD, anxiety, and asthma, who reports chronic difficulty with sleep initiation and sleep maintenance.  She reports snoring.  She has had a sleep study about 5 years ago, before her bariatric surgery and reports that it was negative for sleep apnea at the time.  She was about 100 pounds heavier at the time.  She reports that she tried over-the-counter medication for his sleep including Benadryl or melatonin.  She tried prescription trazodone which was somewhat helpful but because of her other medications it was not recommended that she continue with it long-term.  She has been on Silenor in the past and more recently as well.  She has found it somewhat effective.  She does not take it every night and certainly has not taken it in some weeks.  She reports a strong family history of sleep apnea on her mother's side, her mom is on a CPAP machine.  Patient does report snoring, she woke up recently with a startle, dreaming something that woke her up.  She has a history of night terrors as an adult.  She is married and lives with her husband and her 59-year-old son who sometimes co-sleeps with them.  Her parents also live with her so her young son sometimes sleeps with the grandparents.  I reviewed your telemedicine visit from 05/06/2019. Her Epworth sleepiness score is 10 out of 24, fatigue severity score  is 61 out of 63.  She is a non-smoker, drinks alcohol occasionally, drinks caffeine in the form of tea, up to 3 servings per day of diet Lipton Peach tea.  She had a tonsillectomy as a child when she was in elementary school.  She had gastric bypass surgery in 2016.  She has also tried meditation and essential oils to help her sleep.  She does not typically watch TV in her bedroom.  She does have a white noise machine.  They have 1 dog and 1 [place] pig in the household.  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  She has no night to night nocturia but has had occasional morning headaches especially if she has gone several nights without sleeping well.  Her Past Medical History Is Significant For: Past Medical History:  Diagnosis Date  . Abnormally small mouth   . Anemia   . Anxiety   . Arthritis    right ankle  . Complication of anesthesia    was hard to wake up after gastric bypass; states "fought them" during induction for D & C  . Dental crowns present   . Depression   . Diabetes mellitus without complication (HCC)    diet controlled dm  . GERD (gastroesophageal reflux disease)    no meds currently  . Hypertension    off bp meds x 2 years due to gastric bypass  . 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  Seasonal asthma     Her Past Surgical History Is Significant For: Past Surgical History:  Procedure Laterality Date  . 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 BREAST EXCISIONAL BIOPSY Left   . CHOLECYSTECTOMY    . DG BARIUM SWALLOW ([place] HX)  08/27/2015  . ESOPHAGOGASTRODUODENOSCOPY (EGD) WITH PROPOFOL N/A 08/17/2017   Procedure: ESOPHAGOGASTRODUODENOSCOPY (EGD) WITH PROPOFOL;  Surgeon: Johnathan Hausen, MD;  Location: [facility];  Service: General;  Laterality: N/A;  . FLEXIBLE SIGMOIDOSCOPY  as teenager  . 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 HYSTEROSCOPY W/D&C N/A 07/18/2015    Procedure: DILATATION AND CURETTAGE /HYSTEROSCOPY;  Surgeon: Everlene Farrier, MD;  Location: [facility];  Service: Gynecology;  Laterality: N/A;  . INTRAUTERINE DEVICE (IUD) INSERTION N/A 07/18/2015   Procedure: INTRAUTERINE DEVICE (IUD) INSERTION;  Surgeon: Everlene Farrier, MD;  Location: [facility];  Service: Gynecology;  Laterality: N/A;  . LAPAROSCOPIC ROUX-EN-Y GASTRIC BYPASS WITH HIATAL HERNIA REPAIR  08/27/2015   Procedure: LAPAROSCOPIC ROUX-EN-Y GASTRIC BYPASS WITH HIATAL HERNIA REPAIR;  Surgeon: Johnathan Hausen, MD;  Location: [facility];  Service: General;;  . ORIF ANKLE FRACTURE Right   . OVARIAN CYST REMOVAL    . RADIOACTIVE SEED GUIDED EXCISIONAL BREAST BIOPSY Left 09/03/2016   Procedure: LEFT RADIOACTIVE SEED GUIDED EXCISIONAL BREAST BIOPSY;  Surgeon: Johnathan Hausen, MD;  Location: [facility];  Service: General;  Laterality: Left;  . TONSILLECTOMY AND ADENOIDECTOMY    . UPPER GI ENDOSCOPY  08/27/2015   Procedure: UPPER GI ENDOSCOPY;  Surgeon: Johnathan Hausen, MD;  Location: [facility];  Service: General;;    Her Family History Is Significant For: Family History  Problem Relation Reed of Onset  . Osteoarthritis Mother   . Diabetes Mother   . Hypertension Mother   . Migraines Mother   . Osteoarthritis Father   . Diabetes Father   . Heart failure Father   . Hypertension Father     Her Social History Is Significant For: Social History   Socioeconomic History  . Marital status: Married    Spouse name: Not on file  . Number of children: Not on file  . Years of education: Not on file  . Highest education level: Not on file  Occupational History  . Not on file  Social Needs  . Financial resource strain: Not on file  . Food insecurity    Worry: Not on file    Inability: Not on file  . Transportation needs    Medical: Not on file    Non-medical: Not on file  Tobacco Use  . Smoking status: Never Smoker  . Smokeless tobacco: Never Used  Substance and Sexual Activity  .  Alcohol use: No  . Drug use: No  . Sexual activity: Yes  Lifestyle  . Physical activity    Days per week: Not on file    Minutes per session: Not on file  . Stress: Not on file  Relationships  . Social Herbalist on phone: Not on file    Gets together: Not on file    Attends religious service: Not on file    Active member of club or organization: Not on file    Attends meetings of clubs or organizations: Not on file    Relationship status: Not on file  Other Topics Concern  . Not on file  Social History Narrative  . Not on file  Her Allergies Are:  Allergies  Allergen Reactions  . Morphine And Related Hives  . Adhesive [Tape] Rash    PAPER TAPE IS OKAY  . Aspirin Other (See Comments)    HAS HAD GASTRIC BYPASS  . Nsaids Other (See Comments)    HAS HAD GASTRIC BYPASS  :   Her Current Medications Are:  Outpatient Encounter Medications as of 05/16/2019  Medication Sig  . albuterol (PROVENTIL) (2.5 MG/3ML) 0.083% nebulizer solution Take 2.5 mg by nebulization every 6 (six) hours as needed for wheezing or shortness of breath.  . Ascorbic Acid (VITAMIN C) 1000 MG tablet Take 1,000 mg by mouth 2 (two) times daily.  . budesonide-formoterol (SYMBICORT) 160-4.5 MCG/ACT inhaler Inhale 1 puff into the lungs 2 (two) times daily.  . busPIRone (BUSPAR) 7.5 MG tablet Take 7.5 mg by mouth 3 (three) times daily.  Marland Kitchen CALCIUM CITRATE PO Take 500 mg by mouth 2 (two) times daily.   Marland Kitchen CALCIUM PO Take by mouth.  . DULoxetine (CYMBALTA) 60 MG capsule Take 60 mg by mouth daily.  Marland Kitchen FERROUS FUMARATE PO Take 1 capsule by mouth 2 (two) times daily.  . fexofenadine (ALLEGRA) 180 MG tablet Take 180 mg by mouth every evening.  . lisdexamfetamine (VYVANSE) 30 MG capsule Take 30 mg by mouth daily.  . Multiple Vitamins-Minerals (MULTIVITAMIN & MINERAL PO) Take 1 tablet by mouth 2 (two) times daily.   Marland Kitchen omeprazole (PRILOSEC) 20 MG capsule Take 20 mg by mouth every evening.  Marland Kitchen PROAIR HFA 108 (90  Base) MCG/ACT inhaler Inhale 1-2 puffs into the lungs every 6 (six) hours as needed for wheezing or shortness of breath.   . vitamin E 1000 UNIT capsule Take 1,000 Units by mouth daily.  . [DISCONTINUED] cephALEXin (KEFLEX) 500 MG capsule Take 500 mg by mouth every 6 (six) hours.  . [DISCONTINUED] clindamycin (CLEOCIN) 300 MG capsule Take 300 mg by mouth 2 (two) times a day.  . [DISCONTINUED] Doxepin HCl (SILENOR) 6 MG TABS Take by mouth at bedtime.  . [DISCONTINUED] folic acid (FOLVITE) 270 MCG tablet Take 400 mcg by mouth daily.  . [DISCONTINUED] guanFACINE (TENEX) 1 MG tablet Take 1 mg by mouth at bedtime.  . [DISCONTINUED] HYDROcodone-acetaminophen (NORCO/VICODIN) 5-325 MG tablet Take 1-2 tablets by mouth every 4 (four) hours as needed.  . [DISCONTINUED] oxymetazoline (AFRIN) 0.05 % nasal spray Place 1 spray into both nostrils 2 (two) times daily.  . [DISCONTINUED] promethazine (PHENERGAN) 12.5 MG tablet Take 12.5 mg by mouth every 4 (four) hours as needed for nausea or vomiting.  . [DISCONTINUED] rifampin (RIFADIN) 300 MG capsule Take 300 mg by mouth 2 (two) times daily.  . [DISCONTINUED] sertraline (ZOLOFT) 100 MG tablet Take 100 mg by mouth daily.   No facility-administered encounter medications on file as of 05/16/2019.   :  Review of Systems:  Out of a complete 14 point review of systems, all are reviewed and negative with the exception of these symptoms as listed below: Review of Systems  Neurological:       Pt presents today to discuss her sleep. Pt has had a sleep study about 5 years ago and it was negative for apnea. Pt does endorse snoring.  Epworth Sleepiness Scale 0= would never doze 1= slight chance of dozing 2= moderate chance of dozing 3= high chance of dozing  Sitting and reading: 0 Watching TV: 1 Sitting inactive in a public place (ex. Theater or meeting): 1 As a passenger in a car for an hour without  a break: 2 Lying down to rest in the afternoon: 3 Sitting and  talking to someone: 1 Sitting quietly after lunch (no alcohol): 1 In a car, while stopped in traffic: 1 Total: 10     Objective:  Neurological Exam  Physical Exam Physical Examination:   Vitals:   05/16/19 1106  BP: 135/88  Pulse: 74    General Examination: The patient is a very pleasant 38 y.o. female in no acute distress. She appears well-developed and well-nourished and well groomed.   HEENT: Normocephalic, atraumatic, pupils are equal, round and reactive to light and accommodation. Extraocular tracking is good without limitation to gaze excursion or nystagmus noted. Normal smooth pursuit is noted. Hearing is grossly intact. Face is symmetric with normal facial animation and normal facial sensation. Speech is clear with no dysarthria noted. There is no hypophonia. There is no lip, neck/head, jaw or voice tremor. Neck is supple with full range of passive and active motion. There are no carotid bruits on auscultation. Oropharynx exam reveals: mild mouth dryness, adequate dental hygiene and mild airway crowding, due to Small airway entry.  Tonsils absent.  Mallampati is class I.  Neck circumference is 16-5/8 inches.  She has a minimal overbite. Tongue protrudes centrally and palate elevates symmetrically.  Chest: Clear to auscultation without wheezing, rhonchi or crackles noted.  Heart: S1+S2+0, regular and normal without murmurs, rubs or gallops noted.   Abdomen: Soft, non-tender and non-distended with normal bowel sounds appreciated on auscultation.  Extremities: There is no pitting edema in the distal lower extremities bilaterally.  Skin: Warm and dry without trophic changes noted.  Musculoskeletal: exam reveals no obvious joint deformities, tenderness or joint swelling or erythema.   Neurologically:  Mental status: The patient is awake, alert and oriented in all 4 spheres. Her immediate and remote memory, attention, language skills and fund of knowledge are appropriate. There  is no evidence of aphasia, agnosia, apraxia or anomia. Speech is clear with normal prosody and enunciation. Thought process is linear. Mood is normal and affect is normal.  Cranial nerves II - XII are as described above under HEENT exam. In addition: shoulder shrug is normal with equal shoulder height noted. Motor exam: Normal bulk, strength and tone is noted. There is no drift, tremor or rebound. Romberg is negative. Fine motor skills and coordination: intact with normal finger taps, normal hand movements, normal rapid alternating patting, normal foot taps and normal foot agility.  Cerebellar testing: No dysmetria or intention tremor on finger to nose testing. Heel to shin is unremarkable bilaterally. There is no truncal or gait ataxia.  Sensory exam: intact to light touch in the upper and lower extremities.  Gait, station and balance: She stands easily. No veering to one side is noted. No leaning to one side is noted. Posture is Reed-appropriate and stance is narrow based. Gait shows normal stride length and normal pace. No problems turning are noted. Tandem walk is unremarkable.   Assessment and Plan:  In summary, Mindy Reed is a very pleasant 38 y.o.-year old female with an underlying medical history of reflux disease, diabetes, depression, Morbid obesity, status post bariatric surgery, ADD, anxiety, and asthma, who Presents for evaluation of her sleep difficulties. Her history and physical exam are concerning for obstructive sleep apnea (OSA). I had a long chat with the patient  about my findings and the diagnosis of OSA, its prognosis and treatment options. We talked about medical treatments, surgical interventions and non-pharmacological approaches. I explained in particular the risks  and ramifications of untreated moderate to severe OSA, especially with respect to developing cardiovascular disease down the Road, including congestive heart failure, difficult to treat hypertension, cardiac  arrhythmias, or stroke. Even type 2 diabetes has, in part, been linked to untreated OSA. Symptoms of untreated OSA include daytime sleepiness, memory problems, mood irritability and mood disorder such as depression and anxiety, lack of energy, as well as recurrent headaches, especially morning headaches. We talked about trying to maintain a healthy lifestyle in general, as well as the importance of weight control. I encouraged the patient to eat healthy, exercise daily and keep well hydrated, to keep a scheduled bedtime and wake time routine, to not skip any meals and eat healthy snacks in between meals. I advised the patient not to drive when feeling sleepy.  I recommended the following at this time: sleep study with potential positive airway pressure titration. (We will score hypopneas at 3%. She can bring her sleep aid for her sleep study.  We talked about the possibility of having insomnia with sleep apnea.  Not everybody with sleep apnea sleeps heavily at night and is sleepy during the day. I explained the sleep test procedure to the patient and also outlined possible surgical and non-surgical treatment options of OSA, including the use of a custom-made dental device (which would require a referral to a specialist dentist or oral surgeon), upper airway surgical options, such as pillar implants, radiofrequency surgery, tongue base surgery, and UPPP (which would involve a referral to an ENT surgeon). Rarely, jaw surgery such as mandibular advancement may be considered.  I also explained the CPAP treatment option to the patient, who indicated that she would be willing to try CPAP if the need arises. I explained the importance of being compliant with PAP treatment, not only for insurance purposes but primarily to improve Her symptoms, and for the patient's long term health benefit, including to reduce Her cardiovascular risks. I answered all her questions today and the patient was in agreement. I plan to see  her back after the sleep study is completed and encouraged her to call with any interim questions, concerns, problems or updates.   Thank you very much for allowing me to participate in the care of this nice patient. If I can be of any further assistance to you please do not hesitate to call me at [phone].  Sincerely,   Mindy Age, MD, PhD

## 2019-05-16 NOTE — Patient Instructions (Signed)

## 2019-06-10 ENCOUNTER — Ambulatory Visit (INDEPENDENT_AMBULATORY_CARE_PROVIDER_SITE_OTHER): Payer: BC Managed Care – PPO | Admitting: Neurology

## 2019-06-10 ENCOUNTER — Other Ambulatory Visit: Payer: Self-pay

## 2019-06-10 DIAGNOSIS — Z820 Family history of epilepsy and other diseases of the nervous system: Secondary | ICD-10-CM

## 2019-06-10 DIAGNOSIS — Z9884 Bariatric surgery status: Secondary | ICD-10-CM

## 2019-06-10 DIAGNOSIS — R519 Headache, unspecified: Secondary | ICD-10-CM

## 2019-06-10 DIAGNOSIS — G4710 Hypersomnia, unspecified: Secondary | ICD-10-CM

## 2019-06-10 DIAGNOSIS — R0683 Snoring: Secondary | ICD-10-CM

## 2019-06-10 DIAGNOSIS — G4719 Other hypersomnia: Secondary | ICD-10-CM

## 2019-06-16 ENCOUNTER — Telehealth: Payer: Self-pay

## 2019-06-16 NOTE — Telephone Encounter (Signed)
I called pt. I advised pt that Dr. Rexene Alberts reviewed pt's sleep study and found that did not show any significant osa, mild intermittent snoring was noted, all stages of sleep were achieved. Dr. Rexene Alberts recommends that pt follow up with her PCP. I reviewed sleep hygiene recommendations with the pt, including trying to keep a regular sleep wake schedule, avoiding electronics in the bedroom, keeping the bedroom cool, dark, and quiet, and avoiding eating or exercising within 2 hours of bedtime as well as eating in the middle of the night. I advised pt to keep pets out of the bedroom. I discussed with pt the importance of stress relief and to try meditation, deep breathing exercises, and/or a white noise machine or fan to diffuse other noise distractors. I advised pt to not drink alcohol before bedtime and to never mix alcohol and sedating medications. Pt was advised to avoid narcotic pain medication close to bedtime. I advised pt that a copy of these sleep study results will be sent to [facility], NP. Pt verbalized understanding of results. Pt had no questions at this time but was encouraged to call back if questions arise.

## 2019-06-16 NOTE — Procedures (Signed)
PATIENT'S NAME:  Mindy Reed, Mindy Reed DOB:      [birth year]      MR#:    [id number]     DATE OF RECORDING: 06/10/2019 REFERRING M.D.:  Philmore Pali, NP Study Performed:   Baseline Polysomnogram HISTORY: 38 year old woman with a history of reflux disease, diabetes, depression, Morbid obesity, status post bariatric surgery, ADD, anxiety, and asthma, who reports chronic difficulty with sleep initiation and sleep maintenance.  She reports snoring.  She has had a sleep study about 5 years ago, before her bariatric surgery and reports that it was negative for sleep apnea at the time.  She was about 100 pounds heavier at the time. The patient endorsed the Epworth Sleepiness Scale at 10 points. The patient's weight 360 pounds with a height of 67(inches), resulting in a BMI of 56.4 kg/m2. The patient's neck circumference measured 16.7 inches.  CURRENT MEDICATIONS: Proventil, Vitamin C, Symbicort, Buspar, Calcium, Cymbalta, Ferrous fumarate, Allegra, Vyvanase, Multivitamin & mineral, Prilosec, Proair HFA, Vitamin E   PROCEDURE:  This is a multichannel digital polysomnogram utilizing the Somnostar 11.2 system.  Electrodes and sensors were applied and monitored per AASM Specifications.   EEG, EOG, Chin and Limb EMG, were sampled at 200 Hz.  ECG, Snore and Nasal Pressure, Thermal Airflow, Respiratory Effort, CPAP Flow and Pressure, Oximetry was sampled at 50 Hz. Digital video and audio were recorded.      BASELINE STUDY  Lights Out was at 20:36 and Lights On at 04:57.  Total recording time (TRT) was 502 minutes, with a total sleep time (TST) of 443.5 minutes.   The patient's sleep latency was 51 minutes, which is delayed. REM latency was 247.5 minutes, which is markedly delayed. The sleep efficiency was 88.3 %.     SLEEP ARCHITECTURE: WASO (Wake after sleep onset) was 6.5 minutes with minimal sleep fragmentation noted.  There were 25 minutes in Stage N1, 249 minutes Stage N2, 80 minutes Stage N3 and 89.5 minutes in Stage  REM.  The percentage of Stage N1 was 5.6%, Stage N2 was 56.1%, which is normal, Stage N3 was 18.%, which is normal, and Stage R (REM sleep) was 20.2%, which is normal.   RESPIRATORY ANALYSIS:  There were a total of 8 respiratory events:  0 obstructive apneas, 0 central apneas and 0 mixed apneas with a total of 0 apneas and an apnea index (AI) of 0 /hour. There were 8 hypopneas with a hypopnea index of 1.1 /hour. The patient also had 0 respiratory event related arousals (RERAs).   The total APNEA/HYPOPNEA INDEX (AHI) was 1.1 /hour and the total RESPIRATORY DISTURBANCE INDEX was 0. 1.1 /hour.  5 events occurred in REM sleep and 6 events in NREM. The REM AHI was 3.4 /hour, versus a non-REM AHI of .5. The patient spent 351.5 minutes of total sleep time in the supine position and 92 minutes in non-supine.. The supine AHI was 1.4 versus a non-supine AHI of 0.0.  OXYGEN SATURATION & C02:  The Wake baseline 02 saturation was 94%, with the lowest being 88%. Time spent below 89% saturation equaled 1 minutes.  PERIODIC LIMB MOVEMENTS: The patient had a total of 10 Periodic Limb Movements.  The Periodic Limb Movement (PLM) index was 1.4 and the PLM Arousal index was .1/hour. The arousals were noted as: 38 were spontaneous, 1 were associated with PLMs, 1 were associated with respiratory events.  Audio and video analysis did not show any abnormal or unusual movements, behaviors, phonations or vocalizations. The patient took no  bathroom breaks. Mild, intermittent snoring was noted. The EKG was in keeping with normal sinus rhythm (NSR).  Post-study, the patient indicated that sleep was the same as usual.   IMPRESSION:  1. Primary Snoring  RECOMMENDATIONS:  1. This study does not demonstrate any significant obstructive or central sleep disordered breathing. Only mild, intermittent snoring was noted. This study does not support an intrinsic sleep disorder as a cause of the patient's symptoms. Other causes,  including circadian rhythm disturbances, an underlying mood disorder, medication effect and/or an underlying medical problem cannot be ruled out. 2. This study shows minimal sleep fragmentation and otherwise normal sleep stage percentages. Sleep efficiency was good at over 88%. 3. The patient should be cautioned not to drive, work at heights, or operate dangerous or heavy equipment when tired or sleepy. Review and reiteration of good sleep hygiene measures should be pursued with any patient. 4. The patient will be advised to follow up with the referring provider, who will be notified of the test results.  I certify that I have reviewed the entire raw data recording prior to the issuance of this report in accordance with the Standards of Accreditation of the American Academy of Sleep Medicine (AASM)    [name], MD, PhD Diplomat, American Board of Neurology and Sleep Medicine( Neurology and Sleep Medicine)

## 2019-06-16 NOTE — Telephone Encounter (Signed)
-----   Message from [name], MD sent at 06/16/2019  8:25 AM EDT ----- Patient referred by Charlott Holler, NP, seen by me on 05/16/19, diagnostic PSG on 06/10/19.   Please call and notify the patient that the recent sleep study did not show any significant obstructive sleep apnea. mildl intermittent snoring was noted, all stages of sleep achieved, in normal percentage ranges, slept about 88% of the time tested, which is good.  At this juncture, she can FU with PCP.  Please remind patient to try to maintain good sleep hygiene, which means: Keep a regular sleep and wake schedule and make enough time for sleep (7 1/2 to 8 1/2 hours for the average adult), try not to exercise or have a meal within 2 hours of your bedtime, try to keep your bedroom conducive for sleep, that is, cool and dark, without light distractors such as an illuminated alarm clock, and refrain from watching TV right before sleep or in the middle of the night and do not keep the TV or radio on during the night. If a nightlight is used, have it away from the visual field. Also, try not to use or play on electronic devices at bedtime, such as your cell phone, tablet PC or laptop. If you like to read at bedtime on an electronic device, try to dim the background light as much as possible. Do not eat in the middle of the night. Keep pets away from the bedroom environment. For stress relief, try meditation, deep breathing exercises (there are many books and CDs available), a white noise machine or fan can help to diffuse other noise distractors, such as traffic noise. Do not drink alcohol before bedtime, as it can disturb sleep and cause middle of the night awakenings. Never mix alcohol and sedating medications! Avoid narcotic pain medication close to bedtime, as opioids/narcotics can suppress breathing drive and breathing effort.   Thanks,  [name], MD, PhD [facility])

## 2019-06-16 NOTE — Progress Notes (Signed)
Patient referred by Charlott Holler, NP, seen by me on 05/16/19, diagnostic PSG on 06/10/19.   Please call and notify the patient that the recent sleep study did not show any significant obstructive sleep apnea. mildl intermittent snoring was noted, all stages of sleep achieved, in normal percentage ranges, slept about 88% of the time tested, which is good.  At this juncture, she can FU with PCP.  Please remind patient to try to maintain good sleep hygiene, which means: Keep a regular sleep and wake schedule and make enough time for sleep (7 1/2 to 8 1/2 hours for the average adult), try not to exercise or have a meal within 2 hours of your bedtime, try to keep your bedroom conducive for sleep, that is, cool and dark, without light distractors such as an illuminated alarm clock, and refrain from watching TV right before sleep or in the middle of the night and do not keep the TV or radio on during the night. If a nightlight is used, have it away from the visual field. Also, try not to use or play on electronic devices at bedtime, such as your cell phone, tablet PC or laptop. If you like to read at bedtime on an electronic device, try to dim the background light as much as possible. Do not eat in the middle of the night. Keep pets away from the bedroom environment. For stress relief, try meditation, deep breathing exercises (there are many books and CDs available), a white noise machine or fan can help to diffuse other noise distractors, such as traffic noise. Do not drink alcohol before bedtime, as it can disturb sleep and cause middle of the night awakenings. Never mix alcohol and sedating medications! Avoid narcotic pain medication close to bedtime, as opioids/narcotics can suppress breathing drive and breathing effort.   Thanks,  [name], MD, PhD [facility])

## 2019-11-17 IMAGING — MG DIGITAL DIAGNOSTIC BILATERAL MAMMOGRAM WITH TOMO AND CAD
8 series · 8 of 24 positions shown · non-contrast
Comparison: Previous exam(s).

CLINICAL DATA: 37-year-old female with diffuse bilateral breast
pain.History of LEFT breast fibromatosis with excision.

EXAM:
DIGITAL DIAGNOSTIC BILATERAL MAMMOGRAM WITH CAD AND TOMO

[L CC synth-2D]
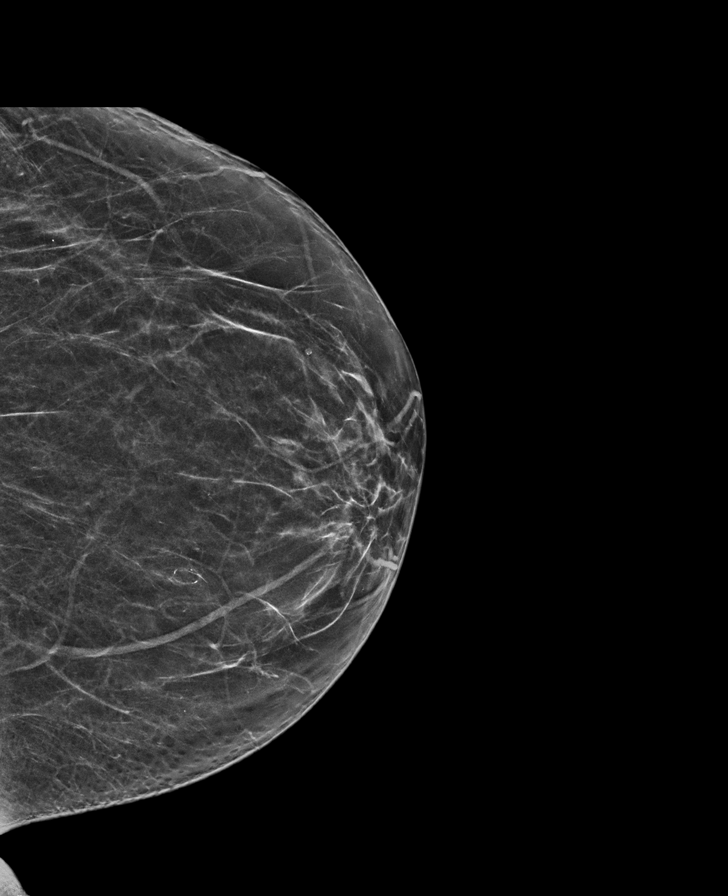

[L MLO synth-2D]
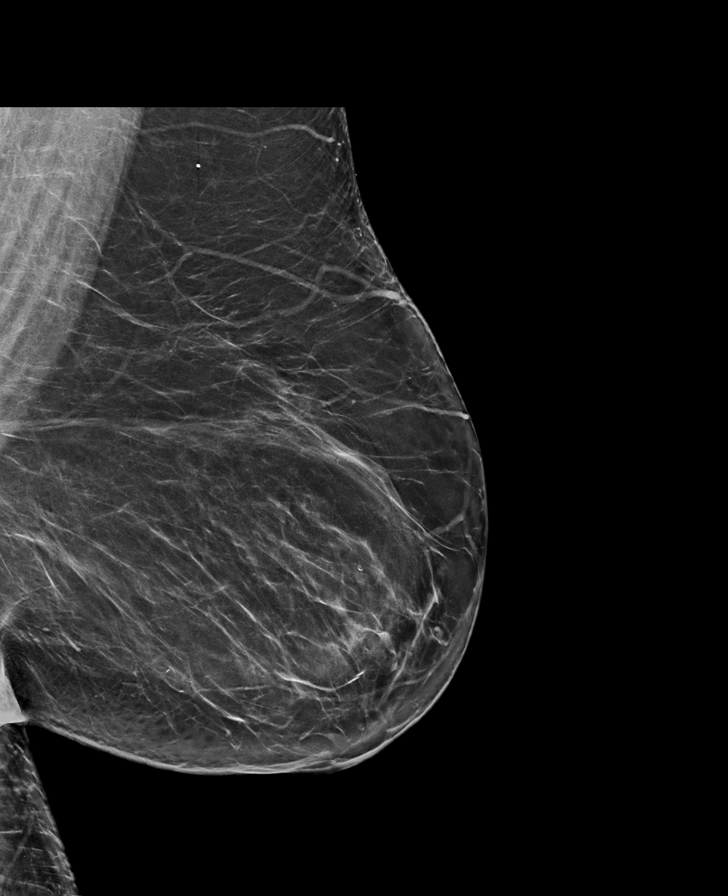

[R CC synth-2D]
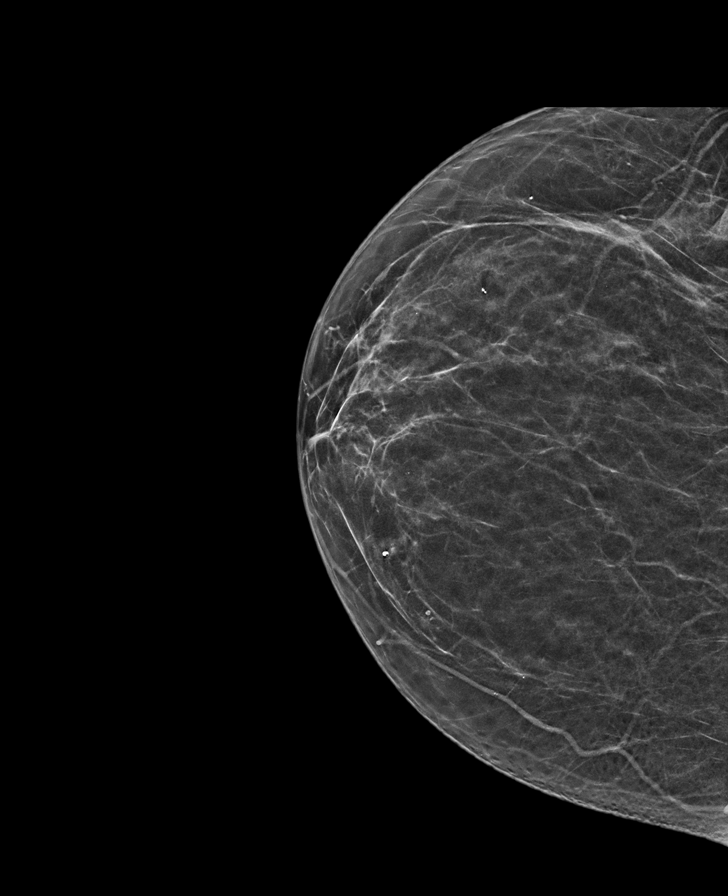

[R MLO synth-2D]
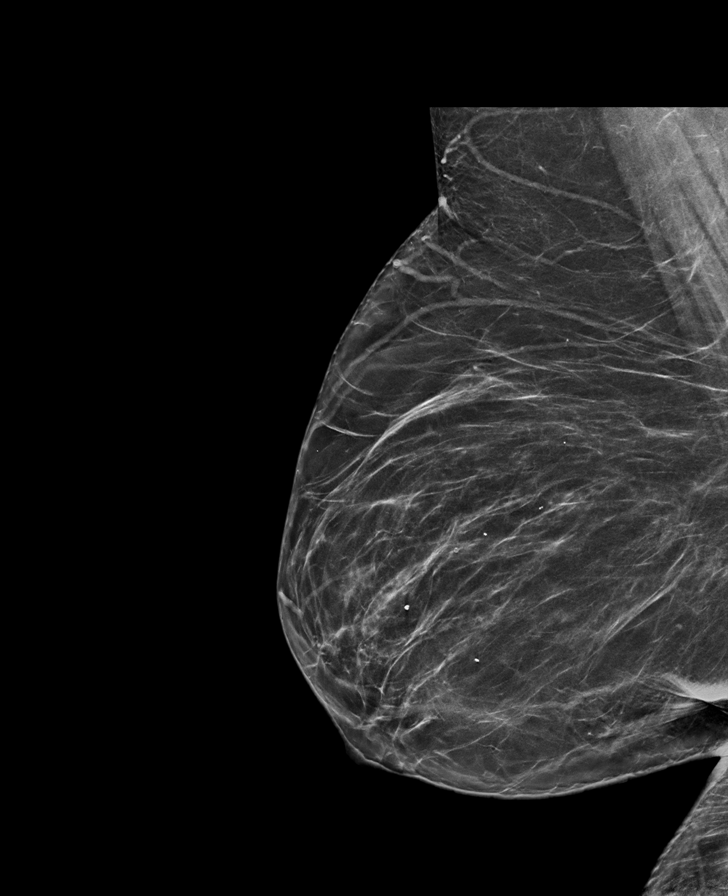

[L MLO tomo · tomo slice 41/80.0]
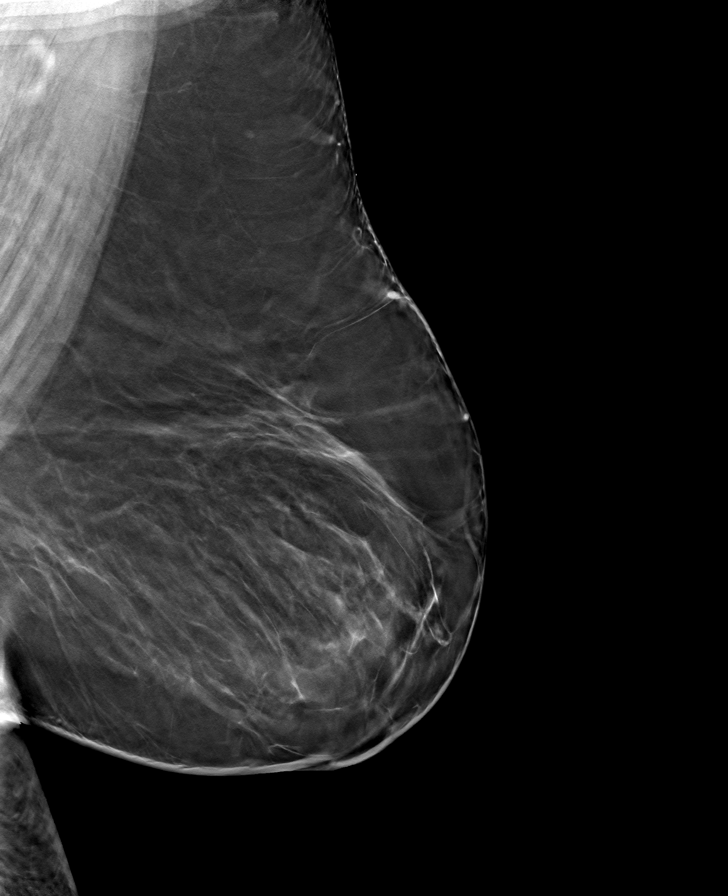

[R MLO tomo · tomo slice 37/73.0]
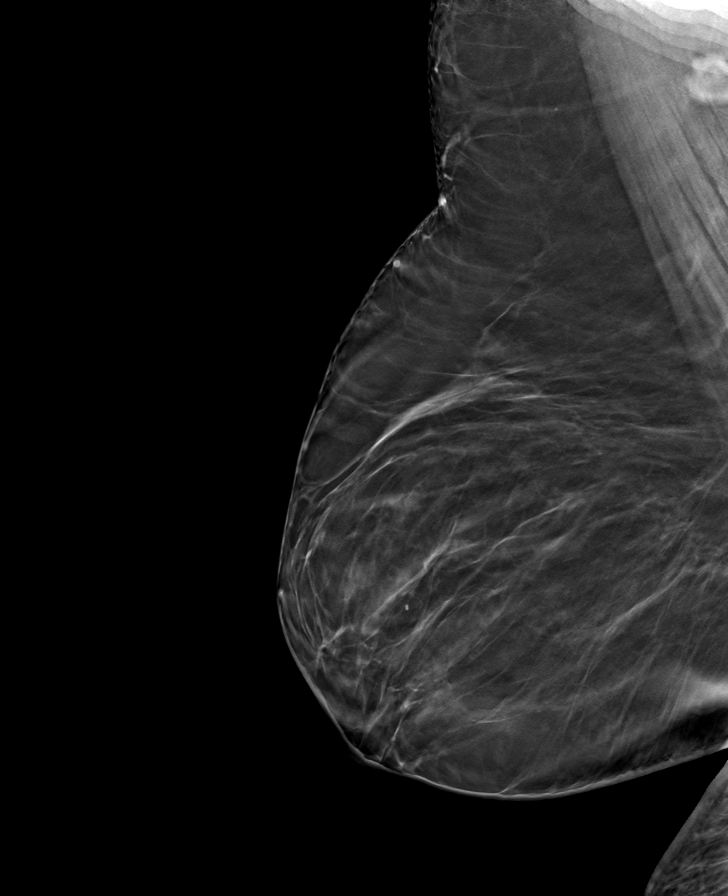

[R CC tomo · tomo slice 30/59.0]
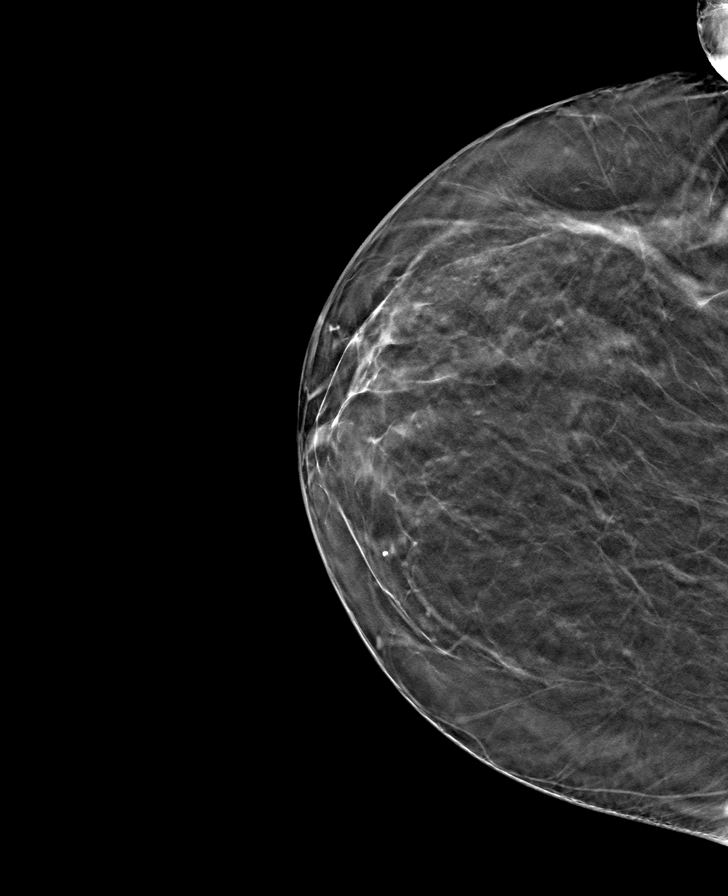

[L CC tomo · tomo slice 32/63.0]
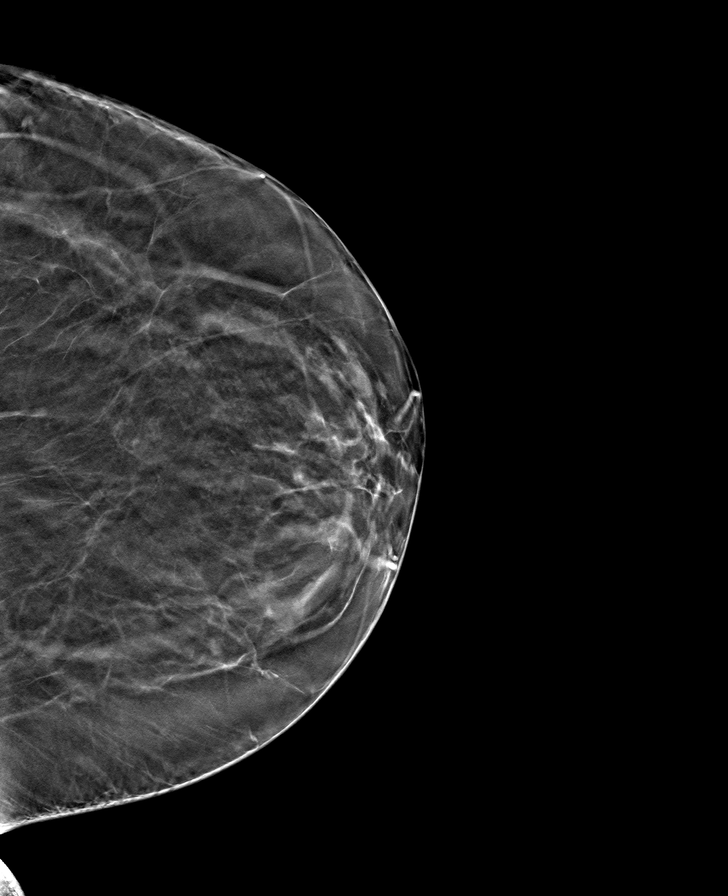

[8 of 24 positions shown; findings below may reference images not displayed]

ACR Breast Density Category b: There are scattered areas of
fibroglandular density.
FINDINGS: 2D/3D full field views of both breast demonstrate no mammographic
evidence of breast malignancy.

Mammographic images were processed with CAD.
IMPRESSION: No mammographic evidence of breast malignancy.

RECOMMENDATION:
Bilateral screening mammogram at age 39

I have discussed the findings, causes of breast pain and
recommendations with the patient. If applicable, a reminder letter
will be sent to the patient regarding the next appointment.

BI-RADS CATEGORY  1: Negative.

## 2020-10-02 DIAGNOSIS — Z9884 Bariatric surgery status: Secondary | ICD-10-CM

## 2020-10-02 HISTORY — DX: Bariatric surgery status: Z98.84

## 2020-10-05 ENCOUNTER — Telehealth: Payer: Self-pay | Admitting: *Deleted

## 2020-10-05 NOTE — Telephone Encounter (Signed)
Spoke with the patient and scheduled a new patient appt on 1/3 at 9 am with Dr Berline Lopes. Patient given the address and phone number for the clinic. Patient given the policy for mask and visitors

## 2020-10-08 ENCOUNTER — Telehealth: Payer: Self-pay

## 2020-10-08 NOTE — Telephone Encounter (Signed)
Moved patient's appointment on 10-23-19 at 0900 to 11:45 am. Pt verbalized understanding.

## 2020-10-19 NOTE — Progress Notes (Signed)
GYNECOLOGIC ONCOLOGY NEW PATIENT CONSULTATION   Patient Name: Mindy Reed  Patient Age: 39 y.o. Date of Service: 10/22/20 Referring Provider: Dr. 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   Discussed the patient findings of cystic mass within the right pelvic most c/w a hydrosalpinx. While this may be the cause of her pelvic pain, the cystic lesion is small enough that it may not be the cause of her pain. Given the size of the lesion and the morbidity associated with surgery given her surgical history and medical comorbidities, I recommend surveillance. We will plan to repeat an ultrasound in 3 months to evaluate for any change in size or character of this mass. If the cystic lesion continues to grow and/or be symptomatic, surgical intervention may be indicated at some future time.  With regard to her abnormal uterine bleeding, the patient has significant risk for excess estrogen exposure (due to her weight). Given change in bleeding pattern over the last year, I think repeat endometrial sampling is indicated. While definitive hysterectomy may be indicated in the future, I don't think that the patient has exhausted non-surgical options for treatment. The patient has had very good success with menstrual regulation with a Mirena IUD. I recommended consideration of endometrial sampling with Mirena IUD insertion. She is amenable to proceeding with this intervention in mid-January. There is comment on possible adenomyosis on recent ultrasound. We discussed that this is not a diagnosis that can be made by imaging. While she may have adenomyosis, her recent pelvic pain description does not point to this as the cause.   The patient was given surgical information and preoperative instructions today. Joylene John, NP will call her to confirm once surgery is  scheduled and will review procedure, risks, and peri-operative instructions over the phone.  A copy of this note was sent to the patient's referring provider.   65 minutes of total time was spent for this patient encounter, including preparation, face-to-face counseling with the patient and coordination of care, and documentation of the encounter.   Jeral Pinch, MD  Division of Gynecologic Oncology  Department of Obstetrics and Gynecology  [facility]  ___________________________________________  Chief Complaint: Chief Complaint  Patient presents with  . Menorrhagia, premenopausal    History of Present Illness:  Mindy Reed is a 39 y.o. y.o. female who is seen in consultation at the request of Dr. Gaetano Net for an evaluation of right hydrosalpinx and AUB.  The patient reports a remote history of having a large ovarian cyst removed at the age of 39 (thinks that there was drainage of about 25 pounds of fluid from the cyst.).  She describes a history of ovarian cysts intermittently but was never officially diagnosed with PCOS until more recently.  She had very regular menses until the birth of her son in 74.  Subsequently, she had irregular menses until October 2016.  At that time, she underwent D&C with Mirena IUD placement in preparation for her gastric bypass which was performed in November of that year.  She did very well with the IUD and had it removed after approximately 1 year to attempt pregnancy again.  She became pregnant in 2018 but had a spontaneous miscarriage.  She thinks she had endometrial sampling again before her second IUD was placed.  This was removed in early 2020 because she developed symptoms of feeling  like she was "being stabbed" with any orgasm.  She notes that the IUD was found to be "turned" inside her uterus at the time of its removal.  Since that time, her menses have never regulated.  She now has bleeding about every 2 to 3 weeks.   Previously, her bleeding would last for 7 days.  Her last menses was 5 days, 2 heavy days of bleeding requiring long overnight pads.  With her last menses, she had no passage of clots.  In October 2021, she developed lower abdominal pain.  In November, she describes that this became pain with intercourse as well as having some bleeding after intercourse.  She describes this pain and pressure initially being on her left, but now intermittently has symptoms on the right as well.  Patient lives in [place] with her husband, son, and her mom.  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  Her surgical history is notable for gastric bypass in 2016.  She lost about 150 pounds and was down to 300 pounds.  She is working on losing weight again and has lost 10 pounds recently.  PAST MEDICAL HISTORY:  Past Medical History:  Diagnosis Date  . Abnormally small mouth   . ADHD   . Anemia   . Anxiety   . Arthritis    right ankle  . Complication of anesthesia    was hard to wake up after gastric bypass; states "fought them" during induction for D & C  . Dental crowns present   . Depression   . Diabetes mellitus without complication (HCC)    diet controlled dm  . GERD (gastroesophageal reflux disease)    no meds currently  . Hidradenitis suppurativa   . Hypertension    off bp meds x 2 years due to gastric bypass  . 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 Seasonal asthma      PAST SURGICAL HISTORY:  Past Surgical History:  Procedure Laterality Date  . 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 BREAST EXCISIONAL BIOPSY Left   . CHOLECYSTECTOMY    . DG BARIUM SWALLOW ([facility] HX)  08/27/2015  . ESOPHAGOGASTRODUODENOSCOPY (EGD) WITH PROPOFOL N/A 08/17/2017   Procedure: ESOPHAGOGASTRODUODENOSCOPY (EGD) WITH PROPOFOL;  Surgeon: Luretha Murphy, MD;  Location: [facility];  Service: General;  Laterality: N/A;  . FLEXIBLE SIGMOIDOSCOPY  as teenager  .  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 HYSTEROSCOPY WITH D & C N/A 07/18/2015   Procedure: DILATATION AND CURETTAGE /HYSTEROSCOPY;  Surgeon: Harold Hedge, MD;  Location: [facility];  Service: Gynecology;  Laterality: N/A;  . INTRAUTERINE DEVICE (IUD) INSERTION N/A 07/18/2015   Procedure: INTRAUTERINE DEVICE (IUD) INSERTION;  Surgeon: Harold Hedge, MD;  Location: [facility];  Service: Gynecology;  Laterality: N/A;  . LAPAROSCOPIC ROUX-EN-Y GASTRIC BYPASS WITH HIATAL HERNIA REPAIR  08/27/2015   Procedure: LAPAROSCOPIC ROUX-EN-Y GASTRIC BYPASS WITH HIATAL HERNIA REPAIR;  Surgeon: Luretha Murphy, MD;  Location: [facility];  Service: General;;  . ORIF ANKLE FRACTURE Right   . OVARIAN CYST REMOVAL    . RADIOACTIVE SEED GUIDED EXCISIONAL BREAST BIOPSY Left 09/03/2016   Procedure: LEFT RADIOACTIVE SEED GUIDED EXCISIONAL BREAST BIOPSY;  Surgeon: Luretha Murphy, MD;  Location: [facility];  Service: General;  Laterality: Left;  . TONSILLECTOMY AND ADENOIDECTOMY    . UPPER GI ENDOSCOPY  08/27/2015  Procedure: UPPER GI ENDOSCOPY;  Surgeon: Johnathan Hausen, MD;  Location: [facility];  Service: General;;    OB/GYN HISTORY:  OB History  Gravida Para Term Preterm AB Living  2 1 1  0 0 1  SAB IAB Ectopic Multiple Live Births  0 0 0 0 1    # Outcome Date GA Lbr Len/2nd Weight Sex Delivery Anes PTL Lv  2 Gravida           1 Term             No LMP recorded.  Age at menarche: 85 Age at menopause: N/A Hx of HRT: N/A Hx of STDs: Denies Last pap: 2021 (last documented in notes is 2017 and normal) History of abnormal pap smears: Denies Contraceptive history: Has had a Mirena IUD twice, received 1 Depo-Provera injection after the birth of her son, and used OCPs as a teen.  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 amphetamine-dextroamphetamine (ADDERALL) 20 MG tablet Take 20 mg by mouth 2 (two) times daily.  . Ascorbic Acid (VITAMIN C) 1000 MG tablet Take 1,000 mg by mouth 2 (two) times daily.  . busPIRone (BUSPAR) 7.5 MG tablet Take 7.5 mg by mouth 3 (three) times daily.  Marland Kitchen CALCIUM PO Take by mouth.  . DULoxetine (CYMBALTA) 60 MG capsule Take 60 mg by mouth daily.  . fexofenadine (ALLEGRA) 180 MG tablet Take 180 mg by mouth every evening.  . Multiple Vitamins-Minerals (MULTIVITAMIN & MINERAL PO) Take 1 tablet by mouth 2 (two) times daily.   Marland Kitchen omeprazole (PRILOSEC) 20 MG capsule Take 20 mg by mouth every evening.  Marland Kitchen PROAIR HFA 108 (90 Base) MCG/ACT inhaler Inhale 1-2 puffs into the lungs every 6 (six) hours as needed for wheezing or shortness of breath.   . sulfamethoxazole-trimethoprim (BACTRIM DS) 800-160 MG tablet Take 1 tablet by mouth 2 (two) times daily.  . traZODone (DESYREL) 50 MG tablet Take 1 tablet by mouth at bedtime as needed.  . vitamin E 1000 UNIT capsule Take 1,000 Units by mouth daily.  . clindamycin (CLEOCIN) 300 MG capsule Take 300 mg by mouth 2 (two) times daily. (Patient not taking: Reported on 10/22/2020)  . [DISCONTINUED] albuterol (PROVENTIL) (2.5 MG/3ML) 0.083% nebulizer solution Take 2.5 mg by nebulization every 6 (six) hours as needed for wheezing or shortness of breath.  . [DISCONTINUED] budesonide-formoterol (SYMBICORT) 160-4.5 MCG/ACT inhaler Inhale 1 puff into the lungs 2 (two) times daily.  . [DISCONTINUED] CALCIUM CITRATE PO Take 500 mg by mouth 2 (two) times daily.   . [DISCONTINUED] FERROUS FUMARATE PO Take 1 capsule by mouth 2 (two) times daily.  . [DISCONTINUED] lisdexamfetamine (VYVANSE) 30 MG capsule Take 30 mg by mouth daily.  . [DISCONTINUED] LORazepam (ATIVAN) 0.5 MG tablet Take 0.5 mg by mouth 2 (two) times daily as needed.  . [DISCONTINUED] rifampin (RIFADIN) 300 MG capsule  Take by mouth.   No facility-administered encounter medications on file as of 10/22/2020.    ALLERGIES:  Allergies  Allergen Reactions  . Morphine And Related Hives  . Adhesive [Tape] Rash    PAPER TAPE IS OKAY  . Aspirin Other (See Comments)    HAS HAD GASTRIC BYPASS  . Nsaids Other (See Comments)    HAS HAD GASTRIC BYPASS  FAMILY HISTORY:  Family History  Problem Relation Age of Onset  . Osteoarthritis Mother   . Diabetes Mother   . Hypertension Mother   . Migraines Mother   . Osteoarthritis Father   . Diabetes Father   . Heart failure Father   . Hypertension Father   . Other Maternal Grandmother        thinks may have had uterine cancer - had hysterectomy at age 64  . Breast cancer Neg Hx   . Ovarian cancer Neg Hx   . Colon cancer Neg Hx      SOCIAL HISTORY:  Social Connections: Not on file    REVIEW OF SYSTEMS:  Pertinent positives include pain with intercourse, hot flashes, menstrual problems, pelvic pain, vaginal bleeding, fatigue, vaginal discharge, dizziness, headaches. Denies appetite changes, fevers, chills, unexplained weight changes. Denies hearing loss, neck lumps or masses, mouth sores, ringing in ears or voice changes. Denies cough or wheezing.  Denies shortness of breath. Denies chest pain or palpitations. Denies leg swelling. Denies abdominal distention, pain, blood in stools, constipation, diarrhea, nausea, vomiting, or early satiety. Denies dysuria, frequency, hematuria or incontinence.   Denies joint pain, back pain or muscle pain/cramps. Denies itching, rash, or wounds. Denies numbness or seizures. Denies swollen lymph nodes or glands, denies easy bruising or bleeding. Denies anxiety, depression, confusion, or decreased concentration.  Physical Exam:  Vital Signs for this encounter:  Blood pressure (!) 159/95, pulse 96, temperature (!) 97.4 F (36.3 C), temperature source Tympanic, resp. rate 16, height 5\' 7"  (1.702 m), weight (!) 354 lb  (160.6 kg), SpO2 100 %, unknown if currently breastfeeding. Body mass index is 55.44 kg/m. General: Alert, oriented, no acute distress.  HEENT: Normocephalic, atraumatic. Sclera anicteric.  Chest: Clear to auscultation bilaterally. No wheezes, rhonchi, or rales. Cardiovascular: Regular rate and rhythm, no murmurs, rubs, or gallops.   LABORATORY AND RADIOLOGIC DATA:  Outside medical records were reviewed to synthesize the above history, along with the history and physical obtained during the visit.   Lab Results  Component Value Date   WBC 7.1 09/20/2017   HGB 13.4 09/20/2017   HCT 41.3 09/20/2017   PLT 368 09/20/2017   GLUCOSE 127 (H) 09/22/2015   ALT 40 09/22/2015   AST 33 09/22/2015   NA 140 09/22/2015   K 3.7 09/22/2015   CL 103 09/22/2015   CREATININE 0.72 09/22/2015   BUN 9 09/22/2015   CO2 26 09/22/2015   HGBA1C 7.2 (H) 08/27/2015   [facility]: Uterus measures 8.5 x 4.9 x 6.1 cm with an endometrial thickness of 8.7 mm.  Myometrium appears somewhat thickened measuring 2.5 cm with the comment of possible adenomyosis.  Bilateral ovaries have simple follicular cyst, measuring up to 1.6 cm.  Right adnexa is noted to have a 4.4 x 8.7 cm elongated cystic mass thought to represent a hydrosalpinx.  No free fluid noted.  Pelvic ultrasound performed in February 2021: Mirena IUD seen within endometrial canal.  Bilateral ovaries normal in appearance with follicular cysts.

## 2020-10-19 NOTE — H&P (View-Only) (Signed)
GYNECOLOGIC ONCOLOGY NEW PATIENT CONSULTATION   Patient Name: Mindy Reed  Patient Age: 39 y.o. Date of Service: 10/22/20 Referring Provider: Dr. 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   Discussed the patient findings of cystic mass within the right pelvic most c/w a hydrosalpinx. While this may be the cause of her pelvic pain, the cystic lesion is small enough that it may not be the cause of her pain. Given the size of the lesion and the morbidity associated with surgery given her surgical history and medical comorbidities, I recommend surveillance. We will plan to repeat an ultrasound in 3 months to evaluate for any change in size or character of this mass. If the cystic lesion continues to grow and/or be symptomatic, surgical intervention may be indicated at some future time.  With regard to her abnormal uterine bleeding, the patient has significant risk for excess estrogen exposure (due to her weight). Given change in bleeding pattern over the last year, I think repeat endometrial sampling is indicated. While definitive hysterectomy may be indicated in the future, I don't think that the patient has exhausted non-surgical options for treatment. The patient has had very good success with menstrual regulation with a Mirena IUD. I recommended consideration of endometrial sampling with Mirena IUD insertion. She is amenable to proceeding with this intervention in mid-January. There is comment on possible adenomyosis on recent ultrasound. We discussed that this is not a diagnosis that can be made by imaging. While she may have adenomyosis, her recent pelvic pain description does not point to this as the cause.   The patient was given surgical information and preoperative instructions today. Joylene John, NP will call her to confirm once surgery is  scheduled and will review procedure, risks, and peri-operative instructions over the phone.  A copy of this note was sent to the patient's referring provider.   65 minutes of total time was spent for this patient encounter, including preparation, face-to-face counseling with the patient and coordination of care, and documentation of the encounter.   Jeral Pinch, MD  Division of Gynecologic Oncology  Department of Obstetrics and Gynecology  [facility]  ___________________________________________  Chief Complaint: Chief Complaint  Patient presents with  . Menorrhagia, premenopausal    History of Present Illness:  Mindy Reed is a 39 y.o. y.o. female who is seen in consultation at the request of Dr. Gaetano Net for an evaluation of right hydrosalpinx and AUB.  The patient reports a remote history of having a large ovarian cyst removed at the age of 68 (thinks that there was drainage of about 25 pounds of fluid from the cyst.).  She describes a history of ovarian cysts intermittently but was never officially diagnosed with PCOS until more recently.  She had very regular menses until the birth of her son in 10.  Subsequently, she had irregular menses until October 2016.  At that time, she underwent D&C with Mirena IUD placement in preparation for her gastric bypass which was performed in November of that year.  She did very well with the IUD and had it removed after approximately 1 year to attempt pregnancy again.  She became pregnant in 2018 but had a spontaneous miscarriage.  She thinks she had endometrial sampling again before her second IUD was placed.  This was removed in early 2020 because she developed symptoms of feeling  like she was "being stabbed" with any orgasm.  She notes that the IUD was found to be "turned" inside her uterus at the time of its removal.  Since that time, her menses have never regulated.  She now has bleeding about every 2 to 3 weeks.   Previously, her bleeding would last for 7 days.  Her last menses was 5 days, 2 heavy days of bleeding requiring long overnight pads.  With her last menses, she had no passage of clots.  In October 2021, she developed lower abdominal pain.  In November, she describes that this became pain with intercourse as well as having some bleeding after intercourse.  She describes this pain and pressure initially being on her left, but now intermittently has symptoms on the right as well.  Patient lives in [place] with her husband, son, and her mom.  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  Her surgical history is notable for gastric bypass in 2016.  She lost about 150 pounds and was down to 300 pounds.  She is working on losing weight again and has lost 10 pounds recently.  PAST MEDICAL HISTORY:  Past Medical History:  Diagnosis Date  . Abnormally small mouth   . ADHD   . Anemia   . Anxiety   . Arthritis    right ankle  . Complication of anesthesia    was hard to wake up after gastric bypass; states "fought them" during induction for D & C  . Dental crowns present   . Depression   . Diabetes mellitus without complication (HCC)    diet controlled dm  . GERD (gastroesophageal reflux disease)    no meds currently  . Hidradenitis suppurativa   . Hypertension    off bp meds x 2 years due to gastric bypass  . 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 Seasonal asthma      PAST SURGICAL HISTORY:  Past Surgical History:  Procedure Laterality Date  . 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 BREAST EXCISIONAL BIOPSY Left   . CHOLECYSTECTOMY    . DG BARIUM SWALLOW ([facility] HX)  08/27/2015  . ESOPHAGOGASTRODUODENOSCOPY (EGD) WITH PROPOFOL N/A 08/17/2017   Procedure: ESOPHAGOGASTRODUODENOSCOPY (EGD) WITH PROPOFOL;  Surgeon: Luretha Murphy, MD;  Location: [facility];  Service: General;  Laterality: N/A;  . FLEXIBLE SIGMOIDOSCOPY  as teenager  .  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 HYSTEROSCOPY WITH D & C N/A 07/18/2015   Procedure: DILATATION AND CURETTAGE /HYSTEROSCOPY;  Surgeon: Harold Hedge, MD;  Location: [facility];  Service: Gynecology;  Laterality: N/A;  . INTRAUTERINE DEVICE (IUD) INSERTION N/A 07/18/2015   Procedure: INTRAUTERINE DEVICE (IUD) INSERTION;  Surgeon: Harold Hedge, MD;  Location: [facility];  Service: Gynecology;  Laterality: N/A;  . LAPAROSCOPIC ROUX-EN-Y GASTRIC BYPASS WITH HIATAL HERNIA REPAIR  08/27/2015   Procedure: LAPAROSCOPIC ROUX-EN-Y GASTRIC BYPASS WITH HIATAL HERNIA REPAIR;  Surgeon: Luretha Murphy, MD;  Location: [facility];  Service: General;;  . ORIF ANKLE FRACTURE Right   . OVARIAN CYST REMOVAL    . RADIOACTIVE SEED GUIDED EXCISIONAL BREAST BIOPSY Left 09/03/2016   Procedure: LEFT RADIOACTIVE SEED GUIDED EXCISIONAL BREAST BIOPSY;  Surgeon: Luretha Murphy, MD;  Location: [facility];  Service: General;  Laterality: Left;  . TONSILLECTOMY AND ADENOIDECTOMY    . UPPER GI ENDOSCOPY  08/27/2015  Procedure: UPPER GI ENDOSCOPY;  Surgeon: Johnathan Hausen, MD;  Location: [facility];  Service: General;;    OB/GYN HISTORY:  OB History  Gravida Para Term Preterm AB Living  2 1 1  0 0 1  SAB IAB Ectopic Multiple Live Births  0 0 0 0 1    # Outcome Date GA Lbr Len/2nd Weight Sex Delivery Anes PTL Lv  2 Gravida           1 Term             No LMP recorded.  Age at menarche: 47 Age at menopause: N/A Hx of HRT: N/A Hx of STDs: Denies Last pap: 2021 (last documented in notes is 2017 and normal) History of abnormal pap smears: Denies Contraceptive history: Has had a Mirena IUD twice, received 1 Depo-Provera injection after the birth of her son, and used OCPs as a teen.  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 amphetamine-dextroamphetamine (ADDERALL) 20 MG tablet Take 20 mg by mouth 2 (two) times daily.  . Ascorbic Acid (VITAMIN C) 1000 MG tablet Take 1,000 mg by mouth 2 (two) times daily.  . busPIRone (BUSPAR) 7.5 MG tablet Take 7.5 mg by mouth 3 (three) times daily.  Marland Kitchen CALCIUM PO Take by mouth.  . DULoxetine (CYMBALTA) 60 MG capsule Take 60 mg by mouth daily.  . fexofenadine (ALLEGRA) 180 MG tablet Take 180 mg by mouth every evening.  . Multiple Vitamins-Minerals (MULTIVITAMIN & MINERAL PO) Take 1 tablet by mouth 2 (two) times daily.   Marland Kitchen omeprazole (PRILOSEC) 20 MG capsule Take 20 mg by mouth every evening.  Marland Kitchen PROAIR HFA 108 (90 Base) MCG/ACT inhaler Inhale 1-2 puffs into the lungs every 6 (six) hours as needed for wheezing or shortness of breath.   . sulfamethoxazole-trimethoprim (BACTRIM DS) 800-160 MG tablet Take 1 tablet by mouth 2 (two) times daily.  . traZODone (DESYREL) 50 MG tablet Take 1 tablet by mouth at bedtime as needed.  . vitamin E 1000 UNIT capsule Take 1,000 Units by mouth daily.  . clindamycin (CLEOCIN) 300 MG capsule Take 300 mg by mouth 2 (two) times daily. (Patient not taking: Reported on 10/22/2020)  . [DISCONTINUED] albuterol (PROVENTIL) (2.5 MG/3ML) 0.083% nebulizer solution Take 2.5 mg by nebulization every 6 (six) hours as needed for wheezing or shortness of breath.  . [DISCONTINUED] budesonide-formoterol (SYMBICORT) 160-4.5 MCG/ACT inhaler Inhale 1 puff into the lungs 2 (two) times daily.  . [DISCONTINUED] CALCIUM CITRATE PO Take 500 mg by mouth 2 (two) times daily.   . [DISCONTINUED] FERROUS FUMARATE PO Take 1 capsule by mouth 2 (two) times daily.  . [DISCONTINUED] lisdexamfetamine (VYVANSE) 30 MG capsule Take 30 mg by mouth daily.  . [DISCONTINUED] LORazepam (ATIVAN) 0.5 MG tablet Take 0.5 mg by mouth 2 (two) times daily as needed.  . [DISCONTINUED] rifampin (RIFADIN) 300 MG capsule  Take by mouth.   No facility-administered encounter medications on file as of 10/22/2020.    ALLERGIES:  Allergies  Allergen Reactions  . Morphine And Related Hives  . Adhesive [Tape] Rash    PAPER TAPE IS OKAY  . Aspirin Other (See Comments)    HAS HAD GASTRIC BYPASS  . Nsaids Other (See Comments)    HAS HAD GASTRIC BYPASS  FAMILY HISTORY:  Family History  Problem Relation Age of Onset  . Osteoarthritis Mother   . Diabetes Mother   . Hypertension Mother   . Migraines Mother   . Osteoarthritis Father   . Diabetes Father   . Heart failure Father   . Hypertension Father   . Other Maternal Grandmother        thinks may have had uterine cancer - had hysterectomy at age 64  . Breast cancer Neg Hx   . Ovarian cancer Neg Hx   . Colon cancer Neg Hx      SOCIAL HISTORY:  Social Connections: Not on file    REVIEW OF SYSTEMS:  Pertinent positives include pain with intercourse, hot flashes, menstrual problems, pelvic pain, vaginal bleeding, fatigue, vaginal discharge, dizziness, headaches. Denies appetite changes, fevers, chills, unexplained weight changes. Denies hearing loss, neck lumps or masses, mouth sores, ringing in ears or voice changes. Denies cough or wheezing.  Denies shortness of breath. Denies chest pain or palpitations. Denies leg swelling. Denies abdominal distention, pain, blood in stools, constipation, diarrhea, nausea, vomiting, or early satiety. Denies dysuria, frequency, hematuria or incontinence.   Denies joint pain, back pain or muscle pain/cramps. Denies itching, rash, or wounds. Denies numbness or seizures. Denies swollen lymph nodes or glands, denies easy bruising or bleeding. Denies anxiety, depression, confusion, or decreased concentration.  Physical Exam:  Vital Signs for this encounter:  Blood pressure (!) 159/95, pulse 96, temperature (!) 97.4 F (36.3 C), temperature source Tympanic, resp. rate 16, height 5\' 7"  (1.702 m), weight (!) 354 lb  (160.6 kg), SpO2 100 %, unknown if currently breastfeeding. Body mass index is 55.44 kg/m. General: Alert, oriented, no acute distress.  HEENT: Normocephalic, atraumatic. Sclera anicteric.  Chest: Clear to auscultation bilaterally. No wheezes, rhonchi, or rales. Cardiovascular: Regular rate and rhythm, no murmurs, rubs, or gallops.   LABORATORY AND RADIOLOGIC DATA:  Outside medical records were reviewed to synthesize the above history, along with the history and physical obtained during the visit.   Lab Results  Component Value Date   WBC 7.1 09/20/2017   HGB 13.4 09/20/2017   HCT 41.3 09/20/2017   PLT 368 09/20/2017   GLUCOSE 127 (H) 09/22/2015   ALT 40 09/22/2015   AST 33 09/22/2015   NA 140 09/22/2015   K 3.7 09/22/2015   CL 103 09/22/2015   CREATININE 0.72 09/22/2015   BUN 9 09/22/2015   CO2 26 09/22/2015   HGBA1C 7.2 (H) 08/27/2015   Pel[facility]: Uterus measures 8.5 x 4.9 x 6.1 cm with an endometrial thickness of 8.7 mm.  Myometrium appears somewhat thickened measuring 2.5 cm with the comment of possible adenomyosis.  Bilateral ovaries have simple follicular cyst, measuring up to 1.6 cm.  Right adnexa is noted to have a 4.4 x 8.7 cm elongated cystic mass thought to represent a hydrosalpinx.  No free fluid noted.  Pelvic ultrasound performed in February 2021: Mirena IUD seen within endometrial canal.  Bilateral ovaries normal in appearance with follicular cysts.

## 2020-10-22 ENCOUNTER — Encounter: Payer: Self-pay | Admitting: Gynecologic Oncology

## 2020-10-22 ENCOUNTER — Other Ambulatory Visit: Payer: Self-pay

## 2020-10-22 ENCOUNTER — Inpatient Hospital Stay: Payer: BC Managed Care – PPO | Attending: Gynecologic Oncology | Admitting: Gynecologic Oncology

## 2020-10-22 VITALS — BP 159/95 | HR 96 | Temp 97.4°F | Resp 16 | Ht 67.0 in | Wt 354.0 lb

## 2020-10-22 DIAGNOSIS — N924 Excessive bleeding in the premenopausal period: Secondary | ICD-10-CM | POA: Diagnosis present

## 2020-10-22 DIAGNOSIS — R102 Pelvic and perineal pain: Secondary | ICD-10-CM

## 2020-10-22 DIAGNOSIS — N949 Unspecified condition associated with female genital organs and menstrual cycle: Secondary | ICD-10-CM

## 2020-10-22 NOTE — Patient Instructions (Addendum)
It was a pleasure meeting you today.  Mindy Reed, my nurse practitioner, will call you in the next couple of days to confirm the surgery date and to go over the preoperative information with you.  The office will also work on getting you scheduled for a repeat ultrasound in approximately 3 months.  Preparing for your Surgery  Plan for surgery on 1/19 with Dr. Berline Lopes at [place]. You will be scheduled for a hysteroscopy, D&C and Mirena IUD insertion.   Pre-operative Testing -You will receive a phone call from presurgical testing at [facility] to arrange for a pre-operative appointment, lab appointment, and COVID test. The COVID test normally happens 3 days prior to the surgery and they ask that you self quarantine after the test up until surgery to decrease chance of exposure.  -Bring your insurance card, copy of an advanced directive if applicable, medication list  -At that visit, you will be asked to sign a consent for a possible blood transfusion in case a transfusion becomes necessary during surgery.  The need for a blood transfusion is rare but having consent is a necessary part of your care.     -You should not be taking blood thinners or aspirin at least ten days prior to surgery unless instructed by your surgeon.  -Do not take supplements such as fish oil (omega 3), red yeast rice, turmeric before your surgery.   Day Before Surgery at [place] will be asked to take in a light diet the day before surgery. You will be advised you can have clear liquids up until 3 hours before your surgery.    Your role in recovery Your role is to become active as soon as directed by your doctor, while still giving yourself time to heal.  Rest when you feel tired. You will be asked to do the following in order to speed your recovery:  Special Considerations -Your final pathology results from surgery should be available around one week after surgery and the results will be relayed to you  when available.  -FMLA forms can be faxed to [phone] and please allow 5-7 business days for completion.  Risks of Surgery Risks of surgery are low but include bleeding, infection, damage to surrounding structures, re-operation, blood clots, and very rarely death.    Blood Transfusion Information (For the consent to be signed before surgery)  We will be checking your blood type before surgery so in case of emergencies, we will know what type of blood you would need.                                            WHAT IS A BLOOD TRANSFUSION?  A transfusion is the replacement of blood or some of its parts. Blood is made up of multiple cells which provide different functions.  Red blood cells carry oxygen and are used for blood loss replacement.  White blood cells fight against infection.  Platelets control bleeding.  Plasma helps clot blood.  Other blood products are available for specialized needs, such as hemophilia or other clotting disorders. BEFORE THE TRANSFUSION  Who gives blood for transfusions?   You may be able to donate blood to be used at a later date on yourself (autologous donation).  Relatives can be asked to donate blood. This is generally not any safer than if you have received blood from  a stranger. The same precautions are taken to ensure safety when a relative's blood is donated.  Healthy volunteers who are fully evaluated to make sure their blood is safe. This is blood bank blood. Transfusion therapy is the safest it has ever been in the practice of medicine. Before blood is taken from a donor, a complete history is taken to make sure that person has no history of diseases nor engages in risky social behavior (examples are intravenous drug use or sexual activity with multiple partners). The donor's travel history is screened to minimize risk of transmitting infections, such as malaria. The donated blood is tested for signs of infectious diseases, such as HIV and  hepatitis. The blood is then tested to be sure it is compatible with you in order to minimize the chance of a transfusion reaction. If you or a relative donates blood, this is often done in anticipation of surgery and is not appropriate for emergency situations. It takes many days to process the donated blood.   Dilation and Curettage, Care After This sheet gives you information about how to care for yourself after your procedure. Your health care provider may also give you more specific instructions. If you have problems or questions, contact your health care provider. What can I expect after the procedure? After your procedure, it is common to have:  Mild pain or cramping.  Some vaginal bleeding or spotting. These may last for up to 2 weeks after your procedure. Follow these instructions at home: Activity    Do not drive or use heavy machinery while taking prescription pain medicine.  Avoid driving for the first 24 hours after your procedure.  Take frequent, short walks, followed by rest periods, throughout the day. Ask your health care provider what activities are safe for you. After 1-2 days, you may be able to return to your normal activities.  Do not lift anything heavier than 10 lb (4.5 kg) until your health care provider approves.  For at least 2 weeks, or as long as told by your health care provider, do not:  [place].  Use tampons.  Have sexual intercourse. General instructions    Take over-the-counter and prescription medicines only as told by your health care provider. This is especially important if you take blood thinning medicine.  Do not take baths, swim, or use a hot tub until your health care provider approves. Take showers instead of baths.  Wear compression stockings as told by your health care provider. These stockings help to prevent blood clots and reduce swelling in your legs.  It is your responsibility to get the results of your procedure. Ask your  health care provider, or the department performing the procedure, when your results will be ready.  Keep all follow-up visits as told by your health care provider. This is important. Contact a health care provider if:  You have severe cramps that get worse or that do not get better with medicine.  You have severe abdominal pain.  You cannot drink fluids without vomiting.  You develop pain in a different area of your pelvis.  You have bad-smelling vaginal discharge.  You have a rash. Get help right away if:  You have vaginal bleeding that soaks more than one sanitary pad in 1 hour, for 2 hours in a row.  You pass large blood clots from your vagina.  You have a fever that is above 100.66F (38.0C).  Your abdomen feels very tender or hard.  You have chest pain.  You have shortness of breath.  You cough up blood.  You feel dizzy or light-headed.  You faint.  You have pain in your neck or shoulder area. This information is not intended to replace advice given to you by your health care provider. Make sure you discuss any questions you have with your health care provider. Document Released: 10/03/2000 Document Revised: 06/04/2016 Document Reviewed: 05/08/2016 Elsevier Interactive Patient Education  2017 [employer].   Intrauterine Device Information An intrauterine device (IUD) is a medical device that is inserted in the uterus to prevent pregnancy. It is a small, T-shaped device that has one or two nylon strings hanging down from it. The strings hang out of the lower part of the uterus (cervix) to allow for future IUD removal. There are two types of IUDs available:  Hormone IUD. This type of IUD is made of plastic and contains the hormone progestin (synthetic progesterone). A hormone IUD may last 3-5 years.  Copper IUD. This type of IUD has copper wire wrapped around it. A copper IUD may last up to 10 years. How is an IUD inserted? An IUD is inserted through the vagina  and placed into the uterus with a minor medical procedure. The exact procedure for IUD insertion may vary among health care providers and hospitals. How does an IUD work? Synthetic progesterone in a hormonal IUD prevents pregnancy by:  Thickening cervical mucus to prevent sperm from entering the uterus.  Thinning the uterine lining to prevent a fertilized egg from being implanted there. Copper in a copper IUD prevents pregnancy by making the uterus and fallopian tubes produce a fluid that kills sperm. What are the advantages of an IUD? Advantages of either type of IUD  It is highly effective in preventing pregnancy.  It is reversible. You can become pregnant shortly after the IUD is removed.  It is low-maintenance and can stay in place for a long time.  There are no estrogen-related side effects.  It can be used when breastfeeding.  It is not associated with weight gain.  It can be inserted right after childbirth, an abortion, or a miscarriage. Advantages of a hormone IUD  If it is inserted within 7 days of your period starting, it works right after it is inserted. If the hormone IUD is inserted at any other time in your cycle, you will need to use a backup method of birth control for 7 days after insertion.  It can make menstrual periods lighter.  It can reduce menstrual cramping.  It can be used for 3-5 years. Advantages of a copper IUD  It works right after it is inserted.  It can be used as a form of emergency birth control if it is inserted within 5 days after having unprotected sex.  It does not interfere with your body's natural hormones.  It can be used for 10 years. What are the disadvantages of an IUD?  An IUD may cause irregular menstrual bleeding for a period of time after insertion.  You may have pain during insertion and have cramping and vaginal bleeding after insertion.  An IUD may cut the uterus (uterine perforation) when it is inserted. This is  rare.  An IUD may cause pelvic inflammatory disease (PID), which is an infection in the uterus and fallopian tubes. This is rare, and it usually happens during the first 20 days after the IUD is inserted.  A copper IUD can make your menstrual flow heavier and more painful. How is an IUD  removed?  You will lie on your back with your knees bent and your feet in footrests (stirrups).  A device will be inserted into your vagina to spread apart the vaginal walls (speculum). This will allow your health care provider to see the strings attached to the IUD.  Your health care provider will use a small instrument (forceps) to grasp the IUD strings and pull firmly until the IUD is removed. You may have some discomfort when the IUD is removed. Your health care provider may recommend taking over-the-counter pain relievers, such as ibuprofen, before the procedure. You may also have minor spotting for a few days after the procedure. The exact procedure for IUD removal may vary among health care providers and hospitals. Is the IUD right for me? Your health care provider will make sure you are a good candidate for an IUD and will discuss the advantages, disadvantages, and possible side effects with you. Summary  An intrauterine device (IUD) is a medical device that is inserted in the uterus to prevent pregnancy. It is a small, T-shaped device that has one or two nylon strings hanging down from it.  A hormone IUD contains the hormone progestin (synthetic progesterone). A copper IUD has copper wire wrapped around it.  Synthetic progesterone in a hormone IUD prevents pregnancy by thickening cervical mucus and thinning the walls of the uterus. Copper in a copper IUD prevents pregnancy by making the uterus and fallopian tubes produce a fluid that kills sperm.  A hormone IUD can be left in place for 3-5 years. A copper IUD can be left in place for up to 10 years.  An IUD is inserted and removed by a health care  provider. You may feel some pain during insertion and removal. Your health care provider may recommend taking over-the-counter pain medicine, such as ibuprofen, before an IUD procedure. This information is not intended to replace advice given to you by your health care provider. Make sure you discuss any questions you have with your health care provider. Document Revised: 09/18/2017 Document Reviewed: 11/04/2016 Elsevier Patient Education  2020 [employer].

## 2020-10-25 ENCOUNTER — Other Ambulatory Visit: Payer: Self-pay | Admitting: Gynecologic Oncology

## 2020-10-25 DIAGNOSIS — N939 Abnormal uterine and vaginal bleeding, unspecified: Secondary | ICD-10-CM

## 2020-10-26 NOTE — Patient Instructions (Addendum)
DUE TO COVID-19 ONLY ONE VISITOR IS ALLOWED TO COME WITH YOU AND STAY IN THE WAITING ROOM ONLY DURING PRE OP AND PROCEDURE DAY OF SURGERY. THE 1 VISITOR  MAY VISIT WITH YOU AFTER SURGERY IN YOUR PRIVATE ROOM DURING VISITING HOURS ONLY!  YOU NEED TO HAVE A COVID 19 TEST ON: 11/03/20 @ 11:00 AM  , THIS TEST MUST BE DONE BEFORE SURGERY,  COVID TESTING SITE [address] [place] [id number], IT IS ON THE RIGHT GOING OUT [address] APPROXIMATELY  2 MINUTES PAST [employer] ON THE RIGHT. ONCE YOUR COVID TEST IS COMPLETED,  PLEASE BEGIN THE QUARANTINE INSTRUCTIONS AS OUTLINED IN YOUR HANDOUT.                Mindy Reed   Your procedure is scheduled on: 11/07/20   Report to [facility] Main  Entrance   Report to admitting at: 6:30 AM     Call this number if you have problems the morning of surgery [phone]    Remember: Do not eat solid food :After Midnight. Clear liquids until: 5:30 am,  CLEAR LIQUID DIET   Foods Allowed                                                                     Foods Excluded  Coffee and tea, regular and decaf                             liquids that you cannot  Plain Jell-O any favor except red or purple                                           see through such as: Fruit ices (not with fruit pulp)                                     milk, soups, orange juice  Iced Popsicles                                    All solid food Carbonated beverages, regular and diet                                    Cranberry, grape and apple juices Sports drinks like Gatorade Lightly seasoned clear broth or consume(fat free) Sugar, honey syrup  Sample Menu Breakfast                                Lunch                                     Supper Cranberry juice  Beef broth                            Chicken broth Jell-O                                     Grape juice                           Apple juice Coffee or tea                         Jell-O                                      Popsicle                                                Coffee or tea                        Coffee or tea  _____________________________________________________________________  BRUSH YOUR TEETH MORNING OF SURGERY AND RINSE YOUR MOUTH OUT, NO CHEWING GUM CANDY OR MINTS.    Take these medicines the morning of surgery with A SIP OF WATER: Buspar,duloxetine,fexofenadine(allegra),omeprazole.Lorazepam as needed.                               You may not have any metal on your body including hair pins and              piercings  Do not wear jewelry, make-up, lotions, powders or perfumes, deodorant             Do not wear nail polish on your fingernails.  Do not shave  48 hours prior to surgery.    Do not bring valuables to the hospital. [facility] IS NOT             RESPONSIBLE   FOR VALUABLES.  Contacts, dentures or bridgework may not be worn into surgery.  Leave suitcase in the car. After surgery it may be brought to your room.     Patients discharged the day of surgery will not be allowed to drive home. IF YOU ARE HAVING SURGERY AND GOING HOME THE SAME DAY, YOU MUST HAVE AN ADULT TO DRIVE YOU HOME AND BE WITH YOU FOR 24 HOURS. YOU MAY GO HOME BY TAXI OR UBER OR ORTHERWISE, BUT AN ADULT MUST ACCOMPANY YOU HOME AND STAY WITH YOU FOR 24 HOURS.  Name and phone number of your driver:  Special Instructions: N/A              Please read over the following fact sheets you were given: _____________________________________________________________________         [facility] - Preparing for Surgery Before surgery, you can play an important role.  Because skin is not sterile, your skin needs to be as free of germs as possible.  You can reduce the number of germs on your skin by washing with CHG (chlorahexidine gluconate) soap before surgery.  CHG is an  antiseptic cleaner which kills germs and bonds with the skin to continue killing germs even  after washing. Please DO NOT use if you have an allergy to CHG or antibacterial soaps.  If your skin becomes reddened/irritated stop using the CHG and inform your nurse when you arrive at Short Stay. Do not shave (including legs and underarms) for at least 48 hours prior to the first CHG shower.  You may shave your face/neck. Please follow these instructions carefully:  1.  Shower with CHG Soap the night before surgery and the  morning of Surgery.  2.  If you choose to wash your hair, wash your hair first as usual with your  normal  shampoo.  3.  After you shampoo, rinse your hair and body thoroughly to remove the  shampoo.                           4.  Use CHG as you would any other liquid soap.  You can apply chg directly  to the skin and wash                       Gently with a scrungie or clean washcloth.  5.  Apply the CHG Soap to your body ONLY FROM THE NECK DOWN.   Do not use on face/ open                           Wound or open sores. Avoid contact with eyes, ears mouth and genitals (private parts).                       Wash face,  Genitals (private parts) with your normal soap.             6.  Wash thoroughly, paying special attention to the area where your surgery  will be performed.  7.  Thoroughly rinse your body with warm water from the neck down.  8.  DO NOT shower/wash with your normal soap after using and rinsing off  the CHG Soap.                9.  Pat yourself dry with a clean towel.            10.  Wear clean pajamas.            11.  Place clean sheets on your bed the night of your first shower and do not  sleep with pets. Day of Surgery : Do not apply any lotions/deodorants the morning of surgery.  Please wear clean clothes to the hospital/surgery center.  FAILURE TO FOLLOW THESE INSTRUCTIONS MAY RESULT IN THE CANCELLATION OF YOUR SURGERY PATIENT SIGNATURE_________________________________  NURSE  SIGNATURE__________________________________  ________________________________________________________________________

## 2020-10-29 ENCOUNTER — Encounter (HOSPITAL_COMMUNITY)
Admission: RE | Admit: 2020-10-29 | Discharge: 2020-10-29 | Disposition: A | Discharge status: Home / Self Care | Payer: BC Managed Care – PPO | Source: Ambulatory Visit | Attending: Gynecologic Oncology | Admitting: Gynecologic Oncology

## 2020-10-29 ENCOUNTER — Other Ambulatory Visit: Payer: Self-pay

## 2020-10-29 ENCOUNTER — Encounter (HOSPITAL_COMMUNITY): Payer: Self-pay

## 2020-10-29 DIAGNOSIS — Z01818 Encounter for other preprocedural examination: Secondary | ICD-10-CM | POA: Insufficient documentation

## 2020-10-29 LAB — CBC
HCT: 40.8 % (ref 36.0–46.0)
Hemoglobin: 13.4 g/dL (ref 12.0–15.0)
MCH: 28.7 pg (ref 26.0–34.0)
MCHC: 32.8 g/dL (ref 30.0–36.0)
MCV: 87.4 fL (ref 80.0–100.0)
Platelets: 408 10*3/uL — ABNORMAL HIGH (ref 150–400)
RBC: 4.67 MIL/uL (ref 3.87–5.11)
RDW: 12.8 % (ref 11.5–15.5)
WBC: 9.9 10*3/uL (ref 4.0–10.5)
nRBC: 0 % (ref 0.0–0.2)

## 2020-10-29 LAB — BASIC METABOLIC PANEL
Anion gap: 11 (ref 5–15)
BUN: 8 mg/dL (ref 6–20)
CO2: 25 mmol/L (ref 22–32)
Calcium: 8.7 mg/dL — ABNORMAL LOW (ref 8.9–10.3)
Chloride: 102 mmol/L (ref 98–111)
Creatinine, Ser: 0.69 mg/dL (ref 0.44–1.00)
GFR, Estimated: >60 mL/min (ref >60)
Glucose, Bld: 116 mg/dL — ABNORMAL HIGH (ref 70–99)
Potassium: 3.6 mmol/L (ref 3.5–5.1)
Sodium: 138 mmol/L (ref 135–145)

## 2020-10-29 LAB — HEMOGLOBIN A1C
Hgb A1c MFr Bld: 6.1 % — ABNORMAL HIGH (ref 4.8–5.6)
Mean Plasma Glucose: 128.37 mg/dL

## 2020-10-29 NOTE — Progress Notes (Signed)
COVID Vaccine Completed: Yes Date COVID Vaccine completed: 08/03/20 COVID vaccine manufacturer:  [employer]    PCP - Charlott Holler: NP Cardiologist - NO  Chest x-ray -  EKG -  Stress Test -  ECHO -  Cardiac Cath -  Pacemaker/ICD device last checked:  Sleep Study -  CPAP -   Fasting Blood Sugar -  Checks Blood Sugar _____ times a day  Blood Thinner Instructions: Aspirin Instructions: Last Dose:  Anesthesia review: Hx: DIA,HTN.  Patient denies shortness of breath, fever, cough and chest pain at PAT appointment   Patient verbalized understanding of instructions that were given to them at the PAT appointment. Patient was also instructed that they will need to review over the PAT instructions again at home before surgery.

## 2020-10-30 ENCOUNTER — Encounter: Payer: Self-pay | Admitting: Gynecologic Oncology

## 2020-10-31 NOTE — Telephone Encounter (Signed)
[facility] that Ms Mindy Reed had medication changes. Adderall was discontinued and she was put on HCTZ 25 mg by her PCP.  Told Carla that chang was made on her medlist by this nurse.  Pt needs to be called if any changes need to be made  To her meds she takes the morning of surgery.  Angela Nevin will give this to the nurse who did her pre-op on 10-29-20 to review.

## 2020-10-31 NOTE — Telephone Encounter (Signed)
Told Mindy Reed that the medication changes were made to the medication list and that pre-surgical testing is aware of changes and will review to see if any medicine changes need to be made to the morning of surgery. If so, they will call her with changes. Pt verbalized understanding.

## 2020-10-31 NOTE — Progress Notes (Signed)
Received report about medication changes,there is not changes on the instructions that pt. Received at PAT.

## 2020-11-03 ENCOUNTER — Other Ambulatory Visit (HOSPITAL_COMMUNITY)
Admission: RE | Admit: 2020-11-03 | Discharge: 2020-11-03 | Disposition: A | Discharge status: Home / Self Care | Payer: BC Managed Care – PPO | Source: Ambulatory Visit | Attending: Gynecologic Oncology | Admitting: Gynecologic Oncology

## 2020-11-03 DIAGNOSIS — Z01812 Encounter for preprocedural laboratory examination: Secondary | ICD-10-CM | POA: Diagnosis present

## 2020-11-03 DIAGNOSIS — Z20822 Contact with and (suspected) exposure to covid-19: Secondary | ICD-10-CM | POA: Insufficient documentation

## 2020-11-03 LAB — SARS CORONAVIRUS 2 (TAT 6-24 HRS): SARS Coronavirus 2: NEGATIVE

## 2020-11-05 ENCOUNTER — Telehealth: Payer: Self-pay | Admitting: *Deleted

## 2020-11-05 ENCOUNTER — Encounter: Payer: Self-pay | Admitting: Gynecologic Oncology

## 2020-11-05 NOTE — Telephone Encounter (Signed)
Patient called and stated "had a COVID test Saturday and it was negative. Saturday night I started having a cough; that is the only symptom. Took a rapid home test and it was negative. Am I ok for my procedure on Wednesday?" Explained that I would reach out to pre admission and call her back.  Reached out to [facility] for pre admissions, Janett Billow APP will call the patient. Called the patient back and explained that [facility] will cal her back.

## 2020-11-06 ENCOUNTER — Telehealth: Payer: Self-pay

## 2020-11-06 NOTE — Anesthesia Preprocedure Evaluation (Addendum)
Anesthesia Evaluation  Patient identified by MRN, date of birth, ID band Patient awake    Reviewed: Allergy & Precautions, NPO status , Patient's Chart, lab work & pertinent test results  Airway Mallampati: I  TM Distance: >3 FB Neck ROM: Full    Dental no notable dental hx.    Pulmonary asthma ,    Pulmonary exam normal breath sounds clear to auscultation       Cardiovascular Pt. on medications Normal cardiovascular exam Rhythm:Regular Rate:Normal     Neuro/Psych PSYCHIATRIC DISORDERS Anxiety Depression negative neurological ROS     GI/Hepatic Neg liver ROS, GERD  Controlled and Medicated,  Endo/Other  diabetesMorbid obesity  Renal/GU negative Renal ROS     Musculoskeletal  (+) Arthritis ,   Abdominal (+) + obese,   Peds  (+) ADHD Hematology negative hematology ROS (+)   Anesthesia Other Findings ABNORMAL UTERINE BLEEDING, PELVIC PAIN, CYSTIC PELVIC MASS  Reproductive/Obstetrics                            Anesthesia Physical Anesthesia Plan  ASA: IV  Anesthesia Plan: General   Post-op Pain Management:    Induction: Intravenous  PONV Risk Score and Plan: 4 or greater and Ondansetron, Dexamethasone, Midazolam, Treatment may vary due to age or medical condition and Scopolamine patch - Pre-op  Airway Management Planned: Oral ETT  Additional Equipment:   Intra-op Plan:   Post-operative Plan: Extubation in OR  Informed Consent: I have reviewed the patients History and Physical, chart, labs and discussed the procedure including the risks, benefits and alternatives for the proposed anesthesia with the patient or authorized representative who has indicated his/her understanding and acceptance.     Dental advisory given  Plan Discussed with: CRNA  Anesthesia Plan Comments:        Anesthesia Quick Evaluation

## 2020-11-06 NOTE — Telephone Encounter (Signed)
Mindy Reed states that she understands her written pre-op instructions.  She asked how many days she should be out of work after the procedure.  Told her that 1-2 days post procedure is recommended.  Told her that Zoila Shutter apologies for not calling her to review information as she had a family emergency.  Pt verbalized understanding.

## 2020-11-07 ENCOUNTER — Encounter (HOSPITAL_COMMUNITY): Payer: Self-pay | Admitting: Gynecologic Oncology

## 2020-11-07 ENCOUNTER — Ambulatory Visit (HOSPITAL_COMMUNITY)
Admission: RE | Admit: 2020-11-07 | Discharge: 2020-11-07 | Disposition: A | Discharge status: Home / Self Care | Payer: BC Managed Care – PPO | Attending: Gynecologic Oncology | Admitting: Gynecologic Oncology

## 2020-11-07 ENCOUNTER — Encounter (HOSPITAL_COMMUNITY)
Admission: RE | Disposition: A | Discharge status: Home / Self Care | Payer: Self-pay | Source: Home / Self Care | Attending: Gynecologic Oncology

## 2020-11-07 ENCOUNTER — Ambulatory Visit (HOSPITAL_COMMUNITY): Payer: BC Managed Care – PPO | Admitting: Physician Assistant

## 2020-11-07 DIAGNOSIS — Z888 Allergy status to other drugs, medicaments and biological substances status: Secondary | ICD-10-CM | POA: Diagnosis not present

## 2020-11-07 DIAGNOSIS — Z886 Allergy status to analgesic agent status: Secondary | ICD-10-CM | POA: Diagnosis not present

## 2020-11-07 DIAGNOSIS — Z79899 Other long term (current) drug therapy: Secondary | ICD-10-CM | POA: Insufficient documentation

## 2020-11-07 DIAGNOSIS — Z9884 Bariatric surgery status: Secondary | ICD-10-CM | POA: Diagnosis not present

## 2020-11-07 DIAGNOSIS — N939 Abnormal uterine and vaginal bleeding, unspecified: Secondary | ICD-10-CM | POA: Diagnosis not present

## 2020-11-07 DIAGNOSIS — R102 Pelvic and perineal pain: Secondary | ICD-10-CM

## 2020-11-07 DIAGNOSIS — Z885 Allergy status to narcotic agent status: Secondary | ICD-10-CM | POA: Diagnosis not present

## 2020-11-07 DIAGNOSIS — Z30430 Encounter for insertion of intrauterine contraceptive device: Secondary | ICD-10-CM | POA: Diagnosis not present

## 2020-11-07 HISTORY — PX: INTRAUTERINE DEVICE (IUD) INSERTION: SHX5877

## 2020-11-07 HISTORY — PX: HYSTEROSCOPY WITH D & C: SHX1775

## 2020-11-07 LAB — GLUCOSE, CAPILLARY: Glucose-Capillary: 180 mg/dL — ABNORMAL HIGH (ref 70–99)

## 2020-11-07 LAB — PREGNANCY, URINE: Preg Test, Ur: NEGATIVE

## 2020-11-07 SURGERY — DILATATION AND CURETTAGE /HYSTEROSCOPY
Anesthesia: General

## 2020-11-07 MED ORDER — ONDANSETRON HCL 4 MG/2ML IJ SOLN
INTRAMUSCULAR | Status: AC
  Filled 2020-11-07: qty 2

## 2020-11-07 MED ORDER — SUCCINYLCHOLINE CHLORIDE 200 MG/10ML IV SOSY
PREFILLED_SYRINGE | INTRAVENOUS | Status: DC | PRN
  Administered 2020-11-07: 150 mg via INTRAVENOUS

## 2020-11-07 MED ORDER — SENNA 8.6 MG PO TABS: 1.0000 | ORAL_TABLET | Freq: Every day | ORAL | 0 refills | Status: DC | PRN

## 2020-11-07 MED ORDER — MIDAZOLAM HCL 2 MG/2ML IJ SOLN
INTRAMUSCULAR | Status: AC
  Filled 2020-11-07: qty 2

## 2020-11-07 MED ORDER — LIDOCAINE HCL (PF) 1 % IJ SOLN
INTRAMUSCULAR | Status: AC
  Filled 2020-11-07: qty 30

## 2020-11-07 MED ORDER — FENTANYL CITRATE (PF) 100 MCG/2ML IJ SOLN
INTRAMUSCULAR | Status: AC
  Filled 2020-11-07: qty 2

## 2020-11-07 MED ORDER — LIDOCAINE HCL (PF) 2 % IJ SOLN
INTRAMUSCULAR | Status: AC
  Filled 2020-11-07: qty 5

## 2020-11-07 MED ORDER — FENTANYL CITRATE (PF) 100 MCG/2ML IJ SOLN
25.0000 ug | INTRAMUSCULAR | Status: DC | PRN
  Administered 2020-11-07 (×3): 50 ug via INTRAVENOUS

## 2020-11-07 MED ORDER — LACTATED RINGERS IV SOLN: INTRAVENOUS | Status: DC

## 2020-11-07 MED ORDER — LIDOCAINE 2% (20 MG/ML) 5 ML SYRINGE
INTRAMUSCULAR | Status: DC | PRN
  Administered 2020-11-07: 60 mg via INTRAVENOUS

## 2020-11-07 MED ORDER — DEXAMETHASONE SODIUM PHOSPHATE 4 MG/ML IJ SOLN
4.0000 mg | INTRAMUSCULAR | Status: AC
  Administered 2020-11-07: 4 mg via INTRAVENOUS

## 2020-11-07 MED ORDER — DEXAMETHASONE SODIUM PHOSPHATE 10 MG/ML IJ SOLN: INTRAMUSCULAR | Status: DC | PRN

## 2020-11-07 MED ORDER — CHLORHEXIDINE GLUCONATE 0.12 % MT SOLN: 15.0000 mL | Freq: Once | OROMUCOSAL | Status: DC

## 2020-11-07 MED ORDER — SODIUM CHLORIDE 0.9 % IR SOLN
Status: DC | PRN
  Administered 2020-11-07: 1000 mL

## 2020-11-07 MED ORDER — SUCCINYLCHOLINE CHLORIDE 200 MG/10ML IV SOSY
PREFILLED_SYRINGE | INTRAVENOUS | Status: AC
  Filled 2020-11-07: qty 10

## 2020-11-07 MED ORDER — GABAPENTIN 300 MG PO CAPS
300.0000 mg | ORAL_CAPSULE | ORAL | Status: AC
  Administered 2020-11-07: 300 mg via ORAL
  Filled 2020-11-07: qty 1

## 2020-11-07 MED ORDER — ONDANSETRON HCL 4 MG/2ML IJ SOLN
INTRAMUSCULAR | Status: DC | PRN
  Administered 2020-11-07: 4 mg via INTRAVENOUS

## 2020-11-07 MED ORDER — PROPOFOL 10 MG/ML IV BOLUS
INTRAVENOUS | Status: DC | PRN
  Administered 2020-11-07: 200 mg via INTRAVENOUS

## 2020-11-07 MED ORDER — ORAL CARE MOUTH RINSE: 15.0000 mL | Freq: Once | OROMUCOSAL | Status: DC

## 2020-11-07 MED ORDER — LEVONORGESTREL 20 MCG/24HR IU IUD
INTRAUTERINE_SYSTEM | INTRAUTERINE | Status: DC
  Filled 2020-11-07: qty 1

## 2020-11-07 MED ORDER — MIDAZOLAM HCL 5 MG/5ML IJ SOLN
INTRAMUSCULAR | Status: DC | PRN
  Administered 2020-11-07: 2 mg via INTRAVENOUS

## 2020-11-07 MED ORDER — DEXAMETHASONE SODIUM PHOSPHATE 10 MG/ML IJ SOLN
INTRAMUSCULAR | Status: AC
  Filled 2020-11-07: qty 1

## 2020-11-07 MED ORDER — SCOPOLAMINE 1 MG/3DAYS TD PT72
1.0000 | MEDICATED_PATCH | TRANSDERMAL | Status: DC
  Administered 2020-11-07: 1.5 mg via TRANSDERMAL
  Filled 2020-11-07: qty 1

## 2020-11-07 MED ORDER — ACETAMINOPHEN 500 MG PO TABS
1000.0000 mg | ORAL_TABLET | ORAL | Status: AC
  Administered 2020-11-07: 1000 mg via ORAL
  Filled 2020-11-07: qty 2

## 2020-11-07 MED ORDER — LIDOCAINE HCL 1 % IJ SOLN
INTRAMUSCULAR | Status: DC | PRN
  Administered 2020-11-07: 10 mL

## 2020-11-07 MED ORDER — PROPOFOL 10 MG/ML IV BOLUS
INTRAVENOUS | Status: AC
  Filled 2020-11-07: qty 40

## 2020-11-07 MED ORDER — PROMETHAZINE HCL 25 MG/ML IJ SOLN: 6.2500 mg | INTRAMUSCULAR | Status: DC | PRN

## 2020-11-07 MED ORDER — FENTANYL CITRATE (PF) 100 MCG/2ML IJ SOLN
INTRAMUSCULAR | Status: DC | PRN
  Administered 2020-11-07: 100 ug via INTRAVENOUS

## 2020-11-07 SURGICAL SUPPLY — 25 items
BIPOLAR CUTTING LOOP 21FR (ELECTRODE)
CANISTER SUCT 3000ML PPV (MISCELLANEOUS) IMPLANT
CATH ROBINSON RED A/P 16FR (CATHETERS) IMPLANT
COVER WAND RF STERILE (DRAPES) ×2 IMPLANT
DEVICE MYOSURE LITE (MISCELLANEOUS) IMPLANT
DEVICE MYOSURE REACH (MISCELLANEOUS) ×2 IMPLANT
DILATOR CANAL MILEX (MISCELLANEOUS) IMPLANT
GAUZE 4X4 16PLY RFD (DISPOSABLE) ×2 IMPLANT
GLOVE SURG ENC MOIS LTX SZ6 (GLOVE) ×2 IMPLANT
GLOVE SURG ENC MOIS LTX SZ6.5 (GLOVE) IMPLANT
GOWN STRL REUS W/TWL LRG LVL3 (GOWN DISPOSABLE) ×2 IMPLANT
IV NS IRRIG 3000ML ARTHROMATIC (IV SOLUTION) ×4 IMPLANT
KIT PROCEDURE FLUENT (KITS) ×2 IMPLANT
KIT TURNOVER KIT A (KITS) ×2 IMPLANT
LOOP CUTTING BIPOLAR 21FR (ELECTRODE) IMPLANT
MYOSURE XL FIBROID REM (MISCELLANEOUS)
PACK VAGINAL MINOR WOMEN LF (CUSTOM PROCEDURE TRAY) ×2 IMPLANT
PAD OB MATERNITY 4.3X12.25 (PERSONAL CARE ITEMS) ×2 IMPLANT
PAD PREP 24X48 CUFFED NSTRL (MISCELLANEOUS) ×2 IMPLANT
SEAL ROD LENS SCOPE MYOSURE (ABLATOR) IMPLANT
SYS MIRENA INTRAUTERINE (MISCELLANEOUS) ×2
SYSTEM MIRENA INTRAUTERINE (MISCELLANEOUS) ×1 IMPLANT
SYSTEM TISS REMOVAL MYSR XL RM (MISCELLANEOUS) IMPLANT
TOWEL OR 17X26 10 PK STRL BLUE (TOWEL DISPOSABLE) ×2 IMPLANT
WATER STERILE IRR 500ML POUR (IV SOLUTION) IMPLANT

## 2020-11-07 NOTE — Transfer of Care (Signed)
Immediate Anesthesia Transfer of Care Note  Patient: Mindy Reed  Procedure(s) Performed: DILATATION AND CURETTAGE /HYSTEROSCOPY WITH MYOSURE (N/A ) INTRAUTERINE DEVICE (IUD) INSERTION MIRENA (N/A )  Patient Location: PACU  Anesthesia Type:General  Level of Consciousness: awake and patient cooperative  Airway & Oxygen Therapy: Patient Spontanous Breathing and Patient connected to face mask oxygen  Post-op Assessment: Report given to RN and Post -op Vital signs reviewed and stable  Post vital signs: Reviewed and stable  Last Vitals:  Vitals Value Taken Time  BP    Temp    Pulse 91 11/07/20 0911  Resp 25 11/07/20 0911  SpO2 100 % 11/07/20 0911  Vitals shown include unvalidated device data.  Last Pain:  Vitals:   11/07/20 0717  TempSrc:   PainSc: 2       Patients Stated Pain Goal: 3 (95/28/41 3244)  Complications: No complications documented.

## 2020-11-07 NOTE — Anesthesia Postprocedure Evaluation (Signed)
Anesthesia Post Note  Patient: Mindy Reed  Procedure(s) Performed: DILATATION AND CURETTAGE /HYSTEROSCOPY WITH MYOSURE (N/A ) INTRAUTERINE DEVICE (IUD) INSERTION MIRENA (N/A )     Patient location during evaluation: PACU Anesthesia Type: General Level of consciousness: awake Pain management: pain level controlled Vital Signs Assessment: post-procedure vital signs reviewed and stable Respiratory status: spontaneous breathing, nonlabored ventilation, respiratory function stable and patient connected to nasal cannula oxygen Cardiovascular status: blood pressure returned to baseline and stable Postop Assessment: no apparent nausea or vomiting Anesthetic complications: no   No complications documented.  Last Vitals:  Vitals:   11/07/20 0945 11/07/20 1000  BP: (!) 158/87 (!) 167/99  Pulse: 81 74  Resp: 16 10  Temp:    SpO2: 98% 95%    Last Pain:  Vitals:   11/07/20 1000  TempSrc:   PainSc: 3                  Lanier Felty P Ariannie Penaloza

## 2020-11-07 NOTE — Discharge Instructions (Signed)
11/07/2020  Return to work: 1-4 days  Activity: 1. Be up and out of the bed during the day.  Take a nap if needed.  You may walk up steps but be careful and use the hand rail.  Stair climbing will tire you more than you think, you may need to stop part way and rest.   2. No lifting or straining for 2 weeks.  3. No driving until you feel comfortable to drive and can brake safely. For most people, this is a couple of days.  4. Shower daily.  Use soap and water on your incision and pat dry; don't rub.   5. No sexual activity and nothing in the vagina for 2-4 weeks.  Medications:  - Take ibuprofen and tylenol first line for pain control.   Diet: 1. Low sodium Heart Healthy Diet is recommended.  2. It is safe to use a laxative if you have difficulty moving your bowels.   Wound Care: 1. Keep clean and dry.  Shower daily.  Reasons to call the Doctor:   Fever - Oral temperature greater than 100.4 degrees Fahrenheit  Foul-smelling vaginal discharge  Difficulty urinating  Nausea and vomiting  Difficulty breathing with or without chest pain  New calf pain especially if only on one side  Sudden, continuing increased vaginal bleeding with or without clots.   Follow-up: 1. See Jeral Pinch on 2/9.  Contacts: For questions or concerns you should contact:  Dr. Jeral Pinch at [phone] After hours and on week-ends call [phone] and ask to speak to the physician on call for Gynecologic Oncology

## 2020-11-07 NOTE — Interval H&P Note (Signed)
History and Physical Interval Note:  11/07/2020 7:39 AM  Mindy Reed  has presented today for surgery, with the diagnosis of ABNORMAL UTERINE BLEEDING, PELVIC PAIN, CYSTIC PELVIC MASS.  The various methods of treatment have been discussed with the patient and family. After consideration of risks, benefits and other options for treatment, the patient has consented to  Procedure(s): DILATATION AND CURETTAGE /HYSTEROSCOPY WITH MYOSURE (N/A) INTRAUTERINE DEVICE (IUD) INSERTION MIRENA (N/A) as a surgical intervention.  The patient's history has been reviewed, patient examined, no change in status, stable for surgery.  I have reviewed the patient's chart and labs.  Questions were answered to the patient's satisfaction.     Mindy Reed

## 2020-11-07 NOTE — Op Note (Signed)
PATIENT: Mindy Reed DATE: 11/07/20   Preop Diagnosis: AUB  Postoperative Diagnosis: same  Surgery: Endometrial sampling using the Myosure device, Mirena IUD insertion  Surgeons:  Jeral Pinch MD Assistant: none  Anesthesia: General   Estimated blood loss: 25 mL  IVF:  See I&O flowsheet   Urine output: none   Complications: None apparent  Pathology: endometrial curetteings  Operative findings: On EUA, small mobile uterus, no adnexal masses appreciated. Uterus sounded to 8.5cm. Cavity without focal lesion, normal appearing endometrium, bilateral tubal ostia appreciated.  Mirena IUD Lot [id number], exp 11/2022  Net fluid balance: -110 mL  Procedure: The patient was identified in the preoperative holding area. Informed consent was signed on the chart. Patient was seen history was reviewed and exam was performed.   The patient was then taken to the operating room and placed in the supine position with SCD hose on. General anesthesia was then induced without difficulty. She was then placed in the dorsolithotomy position. The perineum was prepped with Betadine. The vagina was prepped with Betadine. The patient was then draped after the prep was dried.   Timeout was performed the patient, procedure, antibiotic, allergy, and length of procedure.   The speculum was placed in the posterior vagina. The single tooth tenaculum was placed on the anterior lip of the cervix. 10 cc of 1% lidocaine was injected paracervical. The uterine sound was placed in the cervix and advanced to the fundus. The cervix was successively dilated using pratts dilators to 37F. The hysteroscope was then inserted into the endometrial cavity under direct visualization with findings noted above. The Myosure reach was used to collect a sampling of the endometrium. This was collected in a sock to be sent to pathology.  Mirena IUD was then inserted to the fundus, deployed, and the inserter removed. Strings  were cut at 3cm. The tenaculum was removed and hemostasis was observed.   The vagina was irrigated.  All instrument, suture, laparotomy, Ray-Tec, and needle counts were correct x2.   The patient tolerated the procedure well and was taken recovery room in stable condition.   Jeral Pinch MD Gynecologic Oncology

## 2020-11-07 NOTE — Anesthesia Procedure Notes (Signed)
Procedure Name: Intubation Date/Time: 11/07/2020 8:24 AM Performed by: Montel Clock, CRNA Pre-anesthesia Checklist: Patient identified, Emergency Drugs available, Suction available, Patient being monitored and Timeout performed Patient Re-evaluated:Patient Re-evaluated prior to induction Oxygen Delivery Method: Circle system utilized Preoxygenation: Pre-oxygenation with 100% oxygen Induction Type: IV induction and Rapid sequence Laryngoscope Size: Mac and 3 Grade View: Grade I Tube type: Oral Tube size: 7.0 mm Number of attempts: 1 Airway Equipment and Method: Stylet Placement Confirmation: ETT inserted through vocal cords under direct vision,  positive ETCO2 and breath sounds checked- equal and bilateral Secured at: 22 cm Tube secured with: Tape Dental Injury: Teeth and Oropharynx as per pre-operative assessment

## 2020-11-08 ENCOUNTER — Telehealth: Payer: Self-pay

## 2020-11-08 ENCOUNTER — Encounter (HOSPITAL_COMMUNITY): Payer: Self-pay | Admitting: Gynecologic Oncology

## 2020-11-08 LAB — SURGICAL PATHOLOGY

## 2020-11-08 NOTE — Telephone Encounter (Signed)
Mindy Reed states that she is eating,drinking, and urinating well. Passing gas. She had a BM. Afebrile. Abdominal Cramping controled with Tylenol. Pt aware of post op appointments as well as the office number [phone] and after hours number [phone] to call if she has any questions or concerns

## 2020-11-08 NOTE — Telephone Encounter (Signed)
LM for Mindy Reed to call the office at [phone] to let us know how she was doing after her surgery yesterday.

## 2020-11-11 ENCOUNTER — Encounter: Payer: Self-pay | Admitting: Gynecologic Oncology

## 2020-11-11 ENCOUNTER — Telehealth: Payer: Self-pay | Admitting: Gynecologic Oncology

## 2020-11-11 DIAGNOSIS — R1030 Lower abdominal pain, unspecified: Secondary | ICD-10-CM

## 2020-11-11 DIAGNOSIS — N898 Other specified noninflammatory disorders of vagina: Secondary | ICD-10-CM

## 2020-11-11 MED ORDER — METRONIDAZOLE 500 MG PO TABS: 500.0000 mg | ORAL_TABLET | Freq: Two times a day (BID) | ORAL | 0 refills | Status: AC

## 2020-11-11 MED ORDER — TRAMADOL HCL 50 MG PO TABS: 50.0000 mg | ORAL_TABLET | Freq: Four times a day (QID) | ORAL | 0 refills | Status: DC | PRN

## 2020-11-11 NOTE — Telephone Encounter (Signed)
Areebah called in to report that since Friday, she's had light bleeding and constant pain/cramping (describes as dull). She reports discharge with different smell but not malodorous. Denies fevers, chills, nausea or emesis.   I suspect that this is related to recent procedure and normal menses (she is due). Since she can't take NSAIDs, sent a prescription for tramadol to her pharmacy. Also sent a script for flagyl if she starts to notice increased or foul-smelling discharge.  My office will call her in the morning. If no significant improvement in her symptoms, will plan for clinic visit tomorrow.  Jeral Pinch MD Gynecologic Oncology

## 2020-11-12 ENCOUNTER — Telehealth: Payer: Self-pay

## 2020-11-12 NOTE — Telephone Encounter (Signed)
TC to patient to check on her after weekend call.  Patient stated she had some chills/shivers for a short period during the night last night.  Patient stated she did not take temperature.  Patient stated she feels better today and has only had some abdominal/pelvic "throbbing" but no further chills or shivers.  Patient stated she still has some foul vaginal odor but is not as obvious as before.  Patient declines appointment today and wants to give the flagyl a few days to work. Patient will call with any questions or concerns.

## 2020-11-14 ENCOUNTER — Encounter: Payer: Self-pay | Admitting: Gynecologic Oncology

## 2020-11-25 ENCOUNTER — Encounter: Payer: Self-pay | Admitting: Gynecologic Oncology

## 2020-11-26 ENCOUNTER — Encounter: Payer: Self-pay | Admitting: Gynecologic Oncology

## 2020-11-26 ENCOUNTER — Other Ambulatory Visit: Payer: Self-pay | Admitting: Gynecologic Oncology

## 2020-11-26 DIAGNOSIS — N760 Acute vaginitis: Secondary | ICD-10-CM

## 2020-11-26 MED ORDER — FLUCONAZOLE 150 MG PO TABS: 150.0000 mg | ORAL_TABLET | Freq: Every day | ORAL | 0 refills | Status: DC

## 2020-11-28 ENCOUNTER — Other Ambulatory Visit: Payer: Self-pay

## 2020-11-28 ENCOUNTER — Inpatient Hospital Stay: Payer: BC Managed Care – PPO | Attending: Gynecologic Oncology | Admitting: Gynecologic Oncology

## 2020-11-28 ENCOUNTER — Encounter: Payer: Self-pay | Admitting: Gynecologic Oncology

## 2020-11-28 VITALS — BP 153/87 | HR 77 | Temp 97.5°F | Resp 20 | Ht 67.0 in | Wt 357.0 lb

## 2020-11-28 DIAGNOSIS — Z9889 Other specified postprocedural states: Secondary | ICD-10-CM

## 2020-11-28 DIAGNOSIS — N939 Abnormal uterine and vaginal bleeding, unspecified: Secondary | ICD-10-CM

## 2020-11-28 NOTE — Progress Notes (Signed)
Gynecologic Oncology Return Clinic Visit  11/28/20  Reason for Visit: post-op follow-up  Treatment History: Patient was recently seen for history of abnormal uterine bleeding and imaging findings concerning for a 4 cm hydrosalpinx 1/19: Hysteroscopy, dilation curettage, Mirena IUD placement  Interval History: Patient did well for several days after surgery and then began having pain as well as discharge.  Given her symptoms, I sent a prescription for Flagyl.  Her symptoms resolved within 2 or 3 days of taking the Flagyl.  She was then doing well until the beginning of this week at which point she developed gray discharge as well as vulvar discomfort and pruritus.  Given her symptoms, I sent in a prescription for Diflucan.  She presents today and notes that both her vulvar/vaginal symptoms as well as discharge have resolved.  She continues to have intermittent spotting that requires wearing a pad but is not heavy.  She also has had some clear discharge.  The right lower quadrant pain that she was having when I initially saw her has decreased in both severity and frequency.  She is tolerating a regular diet and reporting regular bowel and bladder function.  Past Medical/Surgical History: Past Medical History:  Diagnosis Date  . Abnormally small mouth   . ADHD   . Anemia   . Anxiety   . Arthritis    right ankle  . Complication of anesthesia    was hard to wake up after gastric bypass; states "fought them" during induction for D & C  . Dental crowns present   . Depression   . Diabetes mellitus without complication (HCC)    diet controlled dm  . GERD (gastroesophageal reflux disease)    no meds currently  . Hidradenitis suppurativa   . Hypertension    off bp meds x 2 years due to gastric bypass  . 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 Seasonal asthma     Past Surgical History:  Procedure Laterality Date  . 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 BREAST  EXCISIONAL BIOPSY Left   . CHOLECYSTECTOMY    . DG BARIUM SWALLOW ([place] HX)  08/27/2015  . ESOPHAGOGASTRODUODENOSCOPY (EGD) WITH PROPOFOL N/A 08/17/2017   Procedure: ESOPHAGOGASTRODUODENOSCOPY (EGD) WITH PROPOFOL;  Surgeon: Johnathan Hausen, MD;  Location: [facility];  Service: General;  Laterality: N/A;  . FLEXIBLE SIGMOIDOSCOPY  as teenager  . 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 HYSTEROSCOPY WITH D & C N/A 07/18/2015   Procedure: DILATATION AND CURETTAGE /HYSTEROSCOPY;  Surgeon: Everlene Farrier, MD;  Location: [facility];  Service: Gynecology;  Laterality: N/A;  . HYSTEROSCOPY WITH D & C N/A 11/07/2020   Procedure: DILATATION AND CURETTAGE /HYSTEROSCOPY WITH MYOSURE;  Surgeon: Lafonda Mosses, MD;  Location: [facility];  Service: Gynecology;  Laterality: N/A;  . INTRAUTERINE DEVICE (IUD) INSERTION N/A 07/18/2015   Procedure: INTRAUTERINE DEVICE (IUD) INSERTION;  Surgeon: Everlene Farrier, MD;  Location: [facility];  Service: Gynecology;  Laterality: N/A;  . INTRAUTERINE DEVICE (IUD) INSERTION N/A 11/07/2020   Procedure: INTRAUTERINE DEVICE (IUD) INSERTION MIRENA;  Surgeon: Lafonda Mosses, MD;  Location: [facility];  Service: Gynecology;  Laterality: N/A;  . LAPAROSCOPIC ROUX-EN-Y GASTRIC BYPASS WITH HIATAL HERNIA REPAIR  08/27/2015   Procedure: LAPAROSCOPIC ROUX-EN-Y GASTRIC BYPASS WITH HIATAL HERNIA REPAIR;  Surgeon: Johnathan Hausen, MD;  Location: [facility];  Service: General;;  . ORIF ANKLE FRACTURE Right   .  OVARIAN CYST REMOVAL    . RADIOACTIVE SEED GUIDED EXCISIONAL BREAST BIOPSY Left 09/03/2016   Procedure: LEFT RADIOACTIVE SEED GUIDED EXCISIONAL BREAST BIOPSY;  Surgeon: Johnathan Hausen, MD;  Location: [facility];  Service: General;  Laterality: Left;  . TONSILLECTOMY AND ADENOIDECTOMY    . UPPER GI ENDOSCOPY  08/27/2015   Procedure: UPPER GI ENDOSCOPY;  Surgeon: Johnathan Hausen, MD;  Location: [facility];  Service: General;;    Family History  Problem  Relation Age of Onset  . Osteoarthritis Mother   . Diabetes Mother   . Hypertension Mother   . Migraines Mother   . Osteoarthritis Father   . Diabetes Father   . Heart failure Father   . Hypertension Father   . Other Maternal Grandmother        thinks may have had uterine cancer - had hysterectomy at age 56  . Breast cancer Neg Hx   . Ovarian cancer Neg Hx   . Colon cancer Neg Hx     Social History   Socioeconomic History  . Marital status: Married    Spouse name: Not on file  . Number of children: 1  . Years of education: Not on file  . Highest education level: Not on file  Occupational History  . Occupation: Pharmacist, hospital  Tobacco Use  . Smoking status: Never Smoker  . Smokeless tobacco: Never Used  Vaping Use  . Vaping Use: Never used  Substance and Sexual Activity  . Alcohol use: No  . Drug use: No  . Sexual activity: Yes    Birth control/protection: Abstinence    Comment: since 08/2020  Other Topics Concern  . Not on file  Social History Narrative  . Not on file   Social Determinants of Health   Financial Resource Strain: Not on file  Food Insecurity: Not on file  Transportation Needs: Not on file  Physical Activity: Not on file  Stress: Not on file  Social Connections: Not on file    Current Medications:  Current Outpatient Medications:  .  acetaminophen (TYLENOL) 500 MG tablet, Take 1,000 mg by mouth every 6 (six) hours as needed for moderate pain or headache., Disp: , Rfl:  .  albuterol (VENTOLIN HFA) 108 (90 Base) MCG/ACT inhaler, Inhale into the lungs., Disp: , Rfl:  .  ascorbic acid (VITAMIN C) 500 MG tablet, Take 500 mg by mouth every evening., Disp: , Rfl:  .  busPIRone (BUSPAR) 7.5 MG tablet, Take 7.5 mg by mouth 2 (two) times daily., Disp: , Rfl:  .  diphenhydrAMINE-zinc acetate (BENADRYL) cream, Apply 1 application topically 3 (three) times daily as needed (eczema)., Disp: , Rfl:  .  DULoxetine (CYMBALTA) 60 MG capsule, Take 60 mg by mouth daily.,  Disp: , Rfl:  .  fexofenadine (ALLEGRA) 180 MG tablet, Take 180 mg by mouth daily., Disp: , Rfl:  .  fluconazole (DIFLUCAN) 150 MG tablet, Take 1 tablet (150 mg total) by mouth daily., Disp: 1 tablet, Rfl: 0 .  hydrochlorothiazide (HYDRODIURIL) 25 MG tablet, Take 50 mg by mouth daily., Disp: , Rfl:  .  LORazepam (ATIVAN) 0.5 MG tablet, Take 0.25 mg by mouth daily as needed for anxiety., Disp: , Rfl:  .  Misc Natural Products (GLUCOS-CHONDROIT-MSM COMPLEX PO), Take 1 tablet by mouth daily., Disp: , Rfl:  .  Multiple Vitamins-Minerals (MULTIVITAMIN & MINERAL PO), Take 1 tablet by mouth 2 (two) times daily. , Disp: , Rfl:  .  omeprazole (PRILOSEC) 20 MG capsule, Take 20 mg  by mouth daily as needed (heartburn)., Disp: , Rfl: 0 .  promethazine (PHENERGAN) 25 MG tablet, Take 25 mg by mouth 2 (two) times daily as needed., Disp: , Rfl:  .  traMADol (ULTRAM) 50 MG tablet, Take 1 tablet (50 mg total) by mouth every 6 (six) hours as needed for up to 10 doses., Disp: 10 tablet, Rfl: 0 .  traZODone (DESYREL) 50 MG tablet, Take 50 mg by mouth at bedtime., Disp: , Rfl:  .  vitamin E 180 MG (400 UNITS) capsule, Take 400 Units by mouth daily., Disp: , Rfl:  .  senna (SENOKOT) 8.6 MG TABS tablet, Take 1 tablet (8.6 mg total) by mouth daily as needed for mild constipation. (Patient not taking: Reported on 11/28/2020), Disp: 10 tablet, Rfl: 0  Review of Systems: + vaginal bleeding and RLQ pain Denies appetite changes, fevers, chills, fatigue, unexplained weight changes. Denies hearing loss, neck lumps or masses, mouth sores, ringing in ears or voice changes. Denies cough or wheezing.  Denies shortness of breath. Denies chest pain or palpitations. Denies leg swelling. Denies abdominal distention, pain, blood in stools, constipation, diarrhea, nausea, vomiting, or early satiety. Denies pain with intercourse, dysuria, frequency, hematuria or incontinence. Denies hot flashes or vaginal discharge.   Denies joint pain,  back pain or muscle pain/cramps. Denies itching, rash, or wounds. Denies dizziness, headaches, numbness or seizures. Denies swollen lymph nodes or glands, denies easy bruising or bleeding. Denies anxiety, depression, confusion, or decreased concentration.  Physical Exam: BP (!) 153/87 (BP Location: Left Arm, Patient Position: Sitting)   Pulse 77   Temp (!) 97.5 F (36.4 C) (Tympanic)   Resp 20   Ht 5\' 7"  (1.702 m)   Wt (!) 357 lb (161.9 kg)   SpO2 99% Comment: RA  BMI 55.91 kg/m  General: Alert, oriented, no acute distress. HEENT: Normocephalic, atraumatic, sclera anicteric. Chest: Unlabored breathing on room air . Abdomen: Obese, soft, nontender.   Extremities: Grossly normal range of motion.  Warm, well perfused.  No edema bilaterally. GU: Normal appearing external genitalia without erythema, excoriation, or lesions.  Speculum exam reveals cervix is normal-appearing with IUD strings protruding approximately 3 cm.  Very scant clear/blood-tinged discharge noted.  Bimanual exam reveals no cervical motion tenderness.  Laboratory & Radiologic Studies: A. ENDOMETRIUM CURETTINGS, CURETTAGE:  - Secretory endometrium.  - No hyperplasia or malignancy.  Assessment & Plan: Mindy Reed is a 40 y.o. woman with abnormal uterine bleeding now several weeks status post hysteroscopy, dilation and curettage and Mirena IUD placement complicated by likely bacterial vaginosis and most recently vaginal candidiasis.  Patient is meeting postoperative milestones and feeling much better after taking Diflucan yesterday.  She is scheduled for follow-up ultrasound in March to reassess her adnexal mass, most consistent with a hydrosalpinx.  Given the improvement in her pain, and less there has been significant change on her upcoming ultrasound, we will likely proceed with conservative management.  Patient is meeting with a nutritionist in the near future and is motivated to restart a weight loss program.  28  minutes of total time was spent for this patient encounter, including preparation, face-to-face counseling with the patient and coordination of care, and documentation of the encounter.  Jeral Pinch, MD  Division of Gynecologic Oncology  Department of Obstetrics and Gynecology  [facility]

## 2020-12-10 ENCOUNTER — Encounter: Payer: Self-pay | Admitting: Gynecologic Oncology

## 2020-12-26 ENCOUNTER — Ambulatory Visit (HOSPITAL_COMMUNITY)
Admission: RE | Admit: 2020-12-26 | Discharge: 2020-12-26 | Disposition: A | Discharge status: Home / Self Care | Payer: BC Managed Care – PPO | Source: Ambulatory Visit | Attending: Gynecologic Oncology | Admitting: Gynecologic Oncology

## 2020-12-26 ENCOUNTER — Encounter: Payer: Self-pay | Admitting: Gynecologic Oncology

## 2020-12-26 ENCOUNTER — Other Ambulatory Visit: Payer: Self-pay

## 2020-12-26 DIAGNOSIS — N949 Unspecified condition associated with female genital organs and menstrual cycle: Secondary | ICD-10-CM

## 2020-12-28 ENCOUNTER — Telehealth: Payer: Self-pay | Admitting: Gynecologic Oncology

## 2020-12-28 NOTE — Telephone Encounter (Signed)
Called patient to discuss ultrasound from this week. Released results this morning with a note in mychart. No answer. Left voicemail with callback number if she'd like to discuss by phone.  Jeral Pinch MD Gynecologic Oncology

## 2021-01-11 ENCOUNTER — Encounter: Payer: Self-pay | Admitting: Gynecologic Oncology

## 2021-05-02 ENCOUNTER — Ambulatory Visit (INDEPENDENT_AMBULATORY_CARE_PROVIDER_SITE_OTHER): Payer: BC Managed Care – PPO | Admitting: Family Medicine

## 2021-05-02 ENCOUNTER — Other Ambulatory Visit: Payer: Self-pay

## 2021-05-02 ENCOUNTER — Encounter (INDEPENDENT_AMBULATORY_CARE_PROVIDER_SITE_OTHER): Payer: Self-pay | Admitting: Family Medicine

## 2021-05-02 ENCOUNTER — Other Ambulatory Visit (INDEPENDENT_AMBULATORY_CARE_PROVIDER_SITE_OTHER): Payer: Self-pay | Admitting: Family Medicine

## 2021-05-02 VITALS — BP 133/83 | HR 77 | Temp 98.1°F | Ht 67.0 in | Wt 345.0 lb

## 2021-05-02 DIAGNOSIS — F900 Attention-deficit hyperactivity disorder, predominantly inattentive type: Secondary | ICD-10-CM | POA: Insufficient documentation

## 2021-05-02 DIAGNOSIS — Z9189 Other specified personal risk factors, not elsewhere classified: Secondary | ICD-10-CM

## 2021-05-02 DIAGNOSIS — E559 Vitamin D deficiency, unspecified: Secondary | ICD-10-CM | POA: Diagnosis not present

## 2021-05-02 DIAGNOSIS — N7011 Chronic salpingitis: Secondary | ICD-10-CM

## 2021-05-02 DIAGNOSIS — E1159 Type 2 diabetes mellitus with other circulatory complications: Secondary | ICD-10-CM

## 2021-05-02 DIAGNOSIS — E1169 Type 2 diabetes mellitus with other specified complication: Secondary | ICD-10-CM

## 2021-05-02 DIAGNOSIS — L732 Hidradenitis suppurativa: Secondary | ICD-10-CM

## 2021-05-02 DIAGNOSIS — F32A Depression, unspecified: Secondary | ICD-10-CM

## 2021-05-02 DIAGNOSIS — F908 Attention-deficit hyperactivity disorder, other type: Secondary | ICD-10-CM

## 2021-05-02 DIAGNOSIS — I152 Hypertension secondary to endocrine disorders: Secondary | ICD-10-CM

## 2021-05-02 DIAGNOSIS — Z6841 Body Mass Index (BMI) 40.0 and over, adult: Secondary | ICD-10-CM

## 2021-05-02 DIAGNOSIS — R5383 Other fatigue: Secondary | ICD-10-CM | POA: Diagnosis not present

## 2021-05-02 DIAGNOSIS — F419 Anxiety disorder, unspecified: Secondary | ICD-10-CM | POA: Diagnosis not present

## 2021-05-02 DIAGNOSIS — F329 Major depressive disorder, single episode, unspecified: Secondary | ICD-10-CM | POA: Insufficient documentation

## 2021-05-02 DIAGNOSIS — E65 Localized adiposity: Secondary | ICD-10-CM | POA: Diagnosis not present

## 2021-05-02 DIAGNOSIS — Z0289 Encounter for other administrative examinations: Secondary | ICD-10-CM

## 2021-05-02 DIAGNOSIS — Z9884 Bariatric surgery status: Secondary | ICD-10-CM

## 2021-05-02 DIAGNOSIS — E282 Polycystic ovarian syndrome: Secondary | ICD-10-CM

## 2021-05-02 DIAGNOSIS — R0602 Shortness of breath: Secondary | ICD-10-CM

## 2021-05-02 DIAGNOSIS — G4709 Other insomnia: Secondary | ICD-10-CM

## 2021-05-02 DIAGNOSIS — J45909 Unspecified asthma, uncomplicated: Secondary | ICD-10-CM

## 2021-05-02 DIAGNOSIS — Z975 Presence of (intrauterine) contraceptive device: Secondary | ICD-10-CM

## 2021-05-02 HISTORY — DX: Depression, unspecified: F32.A

## 2021-05-02 HISTORY — DX: Attention-deficit hyperactivity disorder, predominantly inattentive type: F90.0

## 2021-05-02 HISTORY — DX: Chronic salpingitis: N70.11

## 2021-05-02 MED ORDER — TIRZEPATIDE 2.5 MG/0.5ML ~~LOC~~ SOAJ: 2.5000 mg | SUBCUTANEOUS | 0 refills | Status: DC

## 2021-05-02 NOTE — Telephone Encounter (Signed)
Please work on [employer] for [employer]

## 2021-05-02 NOTE — Telephone Encounter (Signed)
PA has been started

## 2021-05-03 LAB — CBC WITH DIFFERENTIAL/PLATELET
Basophils Absolute: 0.1 10*3/uL (ref 0.0–0.2)
Basos: 1 %
EOS (ABSOLUTE): 0.1 10*3/uL (ref 0.0–0.4)
Eos: 2 %
Hemoglobin: 13.6 g/dL (ref 11.1–15.9)
Immature Grans (Abs): 0 10*3/uL (ref 0.0–0.1)
Immature Granulocytes: 0 %
Lymphocytes Absolute: 1.7 10*3/uL (ref 0.7–3.1)
Lymphs: 22 %
MCH: 29.1 pg (ref 26.6–33.0)
MCHC: 34.1 g/dL (ref 31.5–35.7)
MCV: 85 fL (ref 79–97)
Monocytes Absolute: 0.6 10*3/uL (ref 0.1–0.9)
Monocytes: 7 %
Neutrophils Absolute: 5.4 10*3/uL (ref 1.4–7.0)
Neutrophils: 68 %
Platelets: 440 10*3/uL (ref 150–450)
RBC: 4.68 x10E6/uL (ref 3.77–5.28)
RDW: 13.6 % (ref 11.7–15.4)
WBC: 7.9 10*3/uL (ref 3.4–10.8)

## 2021-05-03 LAB — T4, FREE: Free T4: 0.97 ng/dL (ref 0.82–1.77)

## 2021-05-03 LAB — ANEMIA PANEL
Ferritin: 91 ng/mL (ref 15–150)
Folate, Hemolysate: 606 ng/mL
Folate, RBC: 1519 ng/mL (ref >498)
Hematocrit: 39.9 % (ref 34.0–46.6)
Iron Saturation: 28 % (ref 15–55)
Iron: 68 ug/dL (ref 27–159)
Retic Ct Pct: 1.6 % (ref 0.6–2.6)
Total Iron Binding Capacity: 243 ug/dL — ABNORMAL LOW (ref 250–450)
UIBC: 175 ug/dL (ref 131–425)
Vitamin B-12: 450 pg/mL (ref 232–1245)

## 2021-05-03 LAB — COMPREHENSIVE METABOLIC PANEL
ALT: 10 IU/L (ref 0–32)
AST: 12 IU/L (ref 0–40)
Albumin/Globulin Ratio: 1.8 (ref 1.2–2.2)
Albumin: 4.5 g/dL (ref 3.8–4.8)
Alkaline Phosphatase: 75 IU/L (ref 44–121)
BUN/Creatinine Ratio: 11 (ref 9–23)
BUN: 8 mg/dL (ref 6–24)
Bilirubin Total: 0.4 mg/dL (ref 0.0–1.2)
CO2: 26 mmol/L (ref 20–29)
Calcium: 9.2 mg/dL (ref 8.7–10.2)
Chloride: 92 mmol/L — ABNORMAL LOW (ref 96–106)
Creatinine, Ser: 0.72 mg/dL (ref 0.57–1.00)
Globulin, Total: 2.5 g/dL (ref 1.5–4.5)
Glucose: 196 mg/dL — ABNORMAL HIGH (ref 65–99)
Potassium: 3.3 mmol/L — ABNORMAL LOW (ref 3.5–5.2)
Sodium: 140 mmol/L (ref 134–144)
Total Protein: 7 g/dL (ref 6.0–8.5)
eGFR: 108 mL/min/{1.73_m2} (ref >59)

## 2021-05-03 LAB — LIPID PANEL WITH LDL/HDL RATIO
Cholesterol, Total: 229 mg/dL — ABNORMAL HIGH (ref 100–199)
HDL: 65 mg/dL (ref >39)
LDL Chol Calc (NIH): 145 mg/dL — ABNORMAL HIGH (ref 0–99)
LDL/HDL Ratio: 2.2 ratio (ref 0.0–3.2)
Triglycerides: 108 mg/dL (ref 0–149)
VLDL Cholesterol Cal: 19 mg/dL (ref 5–40)

## 2021-05-03 LAB — HEMOGLOBIN A1C
Est. average glucose Bld gHb Est-mCnc: 174 mg/dL
Hgb A1c MFr Bld: 7.7 % — ABNORMAL HIGH (ref 4.8–5.6)

## 2021-05-03 LAB — TSH: TSH: 3.53 u[IU]/mL (ref 0.450–4.500)

## 2021-05-03 LAB — INSULIN, RANDOM: INSULIN: 17.8 u[IU]/mL (ref 2.6–24.9)

## 2021-05-03 LAB — VITAMIN D 25 HYDROXY (VIT D DEFICIENCY, FRACTURES): Vit D, 25-Hydroxy: 44.5 ng/mL (ref 30.0–100.0)

## 2021-05-06 ENCOUNTER — Encounter (INDEPENDENT_AMBULATORY_CARE_PROVIDER_SITE_OTHER): Payer: Self-pay

## 2021-05-06 NOTE — Telephone Encounter (Signed)
Please advise 

## 2021-05-08 NOTE — Telephone Encounter (Signed)
Sent pt information on savings card

## 2021-05-14 NOTE — Progress Notes (Signed)
Chief Complaint:   OBESITY Mindy Reed (MR# [id number]) is a 40 y.o. female who presents for evaluation and treatment of obesity and related comorbidities. Current BMI is Body mass index is 54.03 kg/m. Mindy Reed has been struggling with her weight for many years and has been unsuccessful in either losing weight, maintaining weight loss, or reaching her healthy weight goal.  Mindy Reed is currently in the action stage of change and ready to dedicate time achieving and maintaining a healthier weight. Mindy Reed is interested in becoming our patient and working on intensive lifestyle modifications including (but not limited to) diet and exercise for weight loss.  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  Drinks regular soda (2-4 cans per day).  Sleep study last year was negative for OSA.  She takes a standard multivitamin twice daily.  She says she is "bad about calcium".  Mindy Reed's habits were reviewed today and are as follows: Her family eats meals together, she thinks her family will eat healthier with her, she struggles with family and or coworkers weight loss sabotage, her desired weight loss is 100 pounds, she has been heavy most of her life, she started gaining weight in childhood, her heaviest weight ever was 500 pounds, she craves chocolate and peanut butter, she snacks frequently in the evenings, she wakes up sometimes in the middle of the night to eat, she skips lunch frequently, she is frequently drinking liquids with calories, she frequently makes poor food choices, and she struggles with emotional eating.  Depression Screen Mindy Reed's Food and Mood (modified PHQ-9) score was 17.  Depression screen PHQ 2/9 05/02/2021  Decreased Interest 1  Down, Depressed, Hopeless 1  PHQ - 2 Score 2  Altered sleeping 2  Tired, decreased energy 3  Change in appetite 3  Feeling bad or failure about yourself  3  Trouble concentrating 3  Moving slowly or  fidgety/restless 0  Suicidal thoughts 1  PHQ-9 Score 17  Difficult doing work/chores Very difficult   Assessment/Plan:   Orders Placed This Encounter  Procedures   Anemia panel   CBC with Differential/Platelet   Hemoglobin A1c   Insulin, random   Lipid Panel With LDL/HDL Ratio   VITAMIN D 25 Hydroxy (Vit-D Deficiency, Fractures)   TSH   T4, free   Comprehensive metabolic panel   EKG [id number]   Meds ordered this encounter  Medications   tirzepatide (MOUNJARO) 2.5 MG/0.5ML Pen    Sig: Inject 2.5 mg into the skin once a week.    Dispense:  2 mL    Refill:  0    1. Other fatigue Mindy Reed denies daytime somnolence and reports waking up still tired. Patent has a history of symptoms of morning fatigue, morning headache, and snoring. Marvelous generally gets 6 or 7 hours of sleep per night, and states that she has poor quality sleep. Snoring is present. Apneic episodes is not present. Epworth Sleepiness Score is 5.  Mindy Reed does feel that her weight is causing her energy to be lower than it should be. Fatigue may be related to obesity, depression or many other causes. Labs will be ordered, and in the meanwhile, Mindy Reed will focus on self care including making healthy food choices, increasing physical activity and focusing on stress reduction.  - EKG 12-Lead - Anemia panel - CBC with Differential/Platelet - Lipid Panel With LDL/HDL Ratio - TSH - T4, free - Comprehensive metabolic panel  2. SOB (shortness of breath) on exertion Mindy Reed notes  increasing shortness of breath with exercising and seems to be worsening over time with weight gain. She notes getting out of breath sooner with activity than she used to. This has not gotten worse recently. Mindy Reed denies shortness of breath at rest or orthopnea.  Mindy Reed does not feel that she gets out of breath more easily that she used to when she exercises. Mindy Reed's shortness of breath appears to be obesity related and exercise induced. She has agreed to work  on weight loss and gradually increase exercise to treat her exercise induced shortness of breath. Will continue to monitor closely.  - Lipid Panel With LDL/HDL Ratio  3. Visceral obesity Current visceral fat rating: 19. Visceral fat rating should be < 13. Visceral adipose tissue is a hormonally active component of total body fat. This body composition phenotype is associated with medical disorders such as metabolic syndrome, cardiovascular disease and several malignancies including prostate, breast, and colorectal cancers. Starting goal: Lose 7-10% of starting weight.   4. PCOS (polycystic ovarian syndrome) She will continue to focus on protein-rich, low simple carbohydrate foods. We reviewed the importance of hydration, regular exercise for stress reduction, and restorative sleep.   Counseling PCOS is a leading cause of menstrual irregularities and infertility. It is also associated with obesity, hirsutism (excessive hair growth on the face, chest, or back), and cardiovascular risk factors such as high cholesterol and insulin resistance. Insulin resistance appears to play a central role.  Women with PCOS have been shown to have impaired appetite-regulating hormones. Metformin is one medication that can improve metabolic parameters.  Women with polycystic ovary syndrome (PCOS) have an increased risk for cardiovascular disease (CVD) - European Journal of Preventive Cardiology.  5. Type 2 diabetes mellitus with other specified complication, without long-term current use of insulin ([place]) Diabetes Mellitus: Not at goal. Medication: None. Issues reviewed: blood sugar goals, complications of diabetes mellitus, hypoglycemia prevention and treatment, exercise, and nutrition.   Plan:  Start Mounjaro 2.5 mg subcutaneously weekly. The patient will continue to focus on protein-rich, low simple carbohydrate foods. We reviewed the importance of hydration, regular exercise for stress reduction, and restorative  sleep. Will check labs today.  Lab Results  Component Value Date   HGBA1C 7.7 (H) 05/02/2021   HGBA1C 6.1 (H) 10/29/2020   HGBA1C 7.2 (H) 08/27/2015   Lab Results  Component Value Date   LDLCALC 145 (H) 05/02/2021   CREATININE 0.72 05/02/2021   - Start tirzepatide [facility]) 2.5 MG/0.5ML Pen; Inject 2.5 mg into the skin once a week.  Dispense: 2 mL; Refill: 0 - Hemoglobin A1c - Insulin, random - Comprehensive metabolic panel  6. Hypertension associated with type 2 diabetes mellitus ([place]) At goal. Medications: HCTZ 50 mg daily.   Plan:  Consider spironolactone.  Avoid buying foods that are: processed, frozen, or prepackaged to avoid excess salt. We will watch for signs of hypotension as she continues lifestyle modifications.  BP Readings from Last 3 Encounters:  05/02/21 133/83  11/28/20 (!) 153/87  11/07/20 (!) 167/99   Lab Results  Component Value Date   CREATININE 0.72 05/02/2021   7. Hidradenitis suppurativa She is taking clindamycin 300 mg twice daily and rifampin 300 mg twice daily.  8. Asthma, unspecified asthma severity, unspecified whether complicated, unspecified whether persistent Mindy Reed is taking Allegra 180 mg daily and has albuterol as needed.  9. Other insomnia Average hours of sleep per night: 6-7. Current treatment: trazodone 50 mg at bedtime.  Plan: Recommend sleep hygiene measures including regular sleep schedule,  optimal sleep environment, and relaxing presleep rituals.   10. Vitamin D deficiency Not optimized.  She is taking OTC vitamin D 1,000 IU daily.  Plan: Continue current OTC vitamin D supplementation.  Follow-up for routine testing of Vitamin D, at least 2-3 times per year to avoid over-replacement.  Lab Results  Component Value Date   VD25OH 44.5 05/02/2021   - VITAMIN D 25 Hydroxy (Vit-D Deficiency, Fractures)  11. IUD (intrauterine device) in place Mindy Reed has a Mirena IUD in place.  12. 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 Hassell Done.  High weight was 443 and low was 300.  Counseling You may need to eat 3 meals and 2 snacks, or 5 small meals each day in order to reach your protein and calorie goals.  Allow at least 15 minutes for each meal so that you can eat mindfully. Listen to your body so that you do not overeat. For most people, your sleeve or pouch will comfortably hold 4-6 ounces. Eat foods from all food groups. This includes fruits and vegetables, grains, dairy, and meat and other proteins. Include a protein-rich food at every meal and snack, and eat the protein food first.  You should be taking a Bariatric Multivitamin as well as calcium.   13. Attention deficit hyperactivity disorder (ADHD), other type Mindy Reed says that Vyvanse worked well, but caused elevated blood pressure, which led to migraines.  14. Anxiety and depression, with emotional eating Not at goal. Medication: Wellbutrin XL 150 mg daily, Cymbalta 60 mg daily, Buspar 7.5 mg twice daily, and lorazepam 0.25 mg as needed for anxiety.  Shatisha eats when stressed, when sad, to stay awake, as a reward, when bored, when feeling guilty, when angry, and as a comfort.  Plan:  Behavior modification techniques were discussed today to help deal with emotional/non-hunger eating behaviors.  15. At risk for heart disease Due to Mindy Reed's current state of health and medical condition(s), she is at a higher risk for heart disease.   This puts the patient at much greater risk to subsequently develop cardiopulmonary conditions that can significantly affect patient's quality of life in a negative manner as well.    At least 9 minutes was spent on counseling Mindy Reed about these concerns today. Initial goal is to lose at least 5-10% of starting weight to help reduce these risk factors.  We will continue to reassess these conditions on a fairly regular basis in an attempt to decrease patient's overall morbidity and mortality.  Evidence-based interventions for health  behavior change were utilized today including the discussion of self monitoring techniques, problem-solving barriers and SMART goal setting techniques.  Specifically regarding patient's less desirable eating habits and patterns, we employed the technique of small changes when Mindy Reed has not been able to fully commit to her prudent nutritional plan.  16. Class 3 severe obesity with serious comorbidity and body mass index (BMI) of 50.0 to 59.9 in adult, unspecified obesity type Mindy Reed)  Mindy Reed is currently in the action stage of change and her goal is to continue with weight loss efforts. I recommend Hayvin begin the structured treatment plan as follows:  She has agreed to the Category 3 Plan with 4 ounces of protein at dinner.  Exercise goals:  As is.    Behavioral modification strategies: increasing lean protein intake, decreasing simple carbohydrates, increasing vegetables, and increasing water intake.  She was informed of the importance of frequent follow-up visits to maximize her success with intensive lifestyle modifications for her multiple  health conditions. She was informed we would discuss her lab results at her next visit unless there is a critical issue that needs to be addressed sooner. Mindy Reed agreed to keep her next visit at the agreed upon time to discuss these results.  Objective:   Blood pressure 133/83, pulse 77, temperature 98.1 F (36.7 C), temperature source Oral, height '5\' 7"'$  (1.702 m), weight (!) 345 lb (156.5 kg), last menstrual period 04/28/2021, SpO2 96 %, unknown if currently breastfeeding. Body mass index is 54.03 kg/m.  EKG: Normal sinus rhythm, rate 80 bpm.  Indirect Calorimeter completed today shows a VO2 of 352 and a REE of 2447.  Her calculated basal metabolic rate is [id number] thus her basal metabolic rate is worse than expected.  General: Cooperative, alert, well developed, in no acute distress. HEENT: Conjunctivae and lids unremarkable. Cardiovascular: Regular rhythm.   Lungs: Normal work of breathing. Neurologic: No focal deficits.   Lab Results  Component Value Date   CREATININE 0.72 05/02/2021   BUN 8 05/02/2021   NA 140 05/02/2021   K 3.3 (L) 05/02/2021   CL 92 (L) 05/02/2021   CO2 26 05/02/2021   Lab Results  Component Value Date   ALT 10 05/02/2021   AST 12 05/02/2021   ALKPHOS 75 05/02/2021   BILITOT 0.4 05/02/2021   Lab Results  Component Value Date   HGBA1C 7.7 (H) 05/02/2021   HGBA1C 6.1 (H) 10/29/2020   HGBA1C 7.2 (H) 08/27/2015   Lab Results  Component Value Date   INSULIN 17.8 05/02/2021   Lab Results  Component Value Date   TSH 3.530 05/02/2021   Lab Results  Component Value Date   CHOL 229 (H) 05/02/2021   HDL 65 05/02/2021   LDLCALC 145 (H) 05/02/2021   TRIG 108 05/02/2021   Lab Results  Component Value Date   VD25OH 44.5 05/02/2021   Lab Results  Component Value Date   WBC 7.9 05/02/2021   HGB 13.6 05/02/2021   HCT 39.9 05/02/2021   MCV 85 05/02/2021   PLT 440 05/02/2021   Lab Results  Component Value Date   IRON 68 05/02/2021   TIBC 243 (L) 05/02/2021   FERRITIN 91 05/02/2021   Attestation Statements:   This is the patient's first visit at [facility]. The patient's NEW PATIENT PACKET was reviewed at length. Included in the packet: current and past health history, medications, allergies, ROS, gynecologic history (women only), surgical history, family history, social history, weight history, weight loss surgery history (for those that have had weight loss surgery), nutritional evaluation, mood and food questionnaire, PHQ9, Epworth questionnaire, sleep habits questionnaire, patient life and health improvement goals questionnaire. These will all be scanned into the patient's chart under media.   During the visit, I independently reviewed the patient's EKG, bioimpedance scale results, and indirect calorimeter results. I used this information to tailor a meal plan for the patient that will  help her to lose weight and will improve her obesity-related conditions going forward. I performed a medically necessary appropriate examination and/or evaluation. I discussed the assessment and treatment plan with the patient. The patient was provided an opportunity to ask questions and all were answered. The patient agreed with the plan and demonstrated an understanding of the instructions. Labs were ordered at this visit and will be reviewed at the next visit unless more critical results need to be addressed immediately. Clinical information was updated and documented in the EMR.   I, Water quality scientist, CMA, am acting  as transcriptionist for Briscoe Deutscher, DO  I have reviewed the above documentation for accuracy and completeness, and I agree with the above. Briscoe Deutscher, DO

## 2021-05-20 ENCOUNTER — Encounter (INDEPENDENT_AMBULATORY_CARE_PROVIDER_SITE_OTHER): Payer: Self-pay | Admitting: Family Medicine

## 2021-05-20 ENCOUNTER — Ambulatory Visit (INDEPENDENT_AMBULATORY_CARE_PROVIDER_SITE_OTHER): Payer: BC Managed Care – PPO | Admitting: Family Medicine

## 2021-05-20 ENCOUNTER — Other Ambulatory Visit: Payer: Self-pay

## 2021-05-20 VITALS — BP 135/84 | HR 79 | Temp 98.9°F | Ht 67.0 in | Wt 340.0 lb

## 2021-05-20 DIAGNOSIS — E785 Hyperlipidemia, unspecified: Secondary | ICD-10-CM

## 2021-05-20 DIAGNOSIS — F902 Attention-deficit hyperactivity disorder, combined type: Secondary | ICD-10-CM

## 2021-05-20 DIAGNOSIS — L732 Hidradenitis suppurativa: Secondary | ICD-10-CM | POA: Diagnosis not present

## 2021-05-20 DIAGNOSIS — R609 Edema, unspecified: Secondary | ICD-10-CM

## 2021-05-20 DIAGNOSIS — E1169 Type 2 diabetes mellitus with other specified complication: Secondary | ICD-10-CM

## 2021-05-20 DIAGNOSIS — E282 Polycystic ovarian syndrome: Secondary | ICD-10-CM | POA: Diagnosis not present

## 2021-05-20 DIAGNOSIS — Z9189 Other specified personal risk factors, not elsewhere classified: Secondary | ICD-10-CM | POA: Diagnosis not present

## 2021-05-20 DIAGNOSIS — Z6841 Body Mass Index (BMI) 40.0 and over, adult: Secondary | ICD-10-CM

## 2021-05-20 MED ORDER — SPIRONOLACTONE 50 MG PO TABS
50.0000 mg | ORAL_TABLET | Freq: Every day | ORAL | 0 refills | Status: DC
Start: 2021-05-20 — End: 2021-06-20

## 2021-05-20 MED ORDER — LISDEXAMFETAMINE DIMESYLATE 40 MG PO CAPS: 40.0000 mg | ORAL_CAPSULE | Freq: Every morning | ORAL | 0 refills | Status: DC

## 2021-05-21 NOTE — Telephone Encounter (Signed)
Pt last seen by Dr. Wallace.  

## 2021-05-22 NOTE — Progress Notes (Signed)
Chief Complaint:   OBESITY Mindy Reed is here to discuss her progress with her obesity treatment plan along with follow-up of her obesity related diagnoses.   Today's visit was #: 2 Starting weight: 345 lbs Starting date: 05/02/2021 Today's weight: 340 lbs Today's date: 05/20/2021 Weight change since last visit: 5 lbs Total lbs lost to date: 5 lbs Body mass index is 53.25 kg/m.  Total weight loss percentage to date: -1.45%  Interim History: Mindy Reed started [facility] last Thursday.  She endorses constipation.  She is on a probiotic.   Current Meal Plan: the Category 3 Plan for 95% of the time.  Current Exercise Plan: None. Current Anti-Obesity Medications: Mounjaro 2.5 mg subcutaneously weekly. Side effects: Mild constipation.  Assessment/Plan:   1. Edema Mild. Diet precautions reviewed. Start spironolactone 50 mg daily for edema.  2. Hidradenitis suppurativa Mindy Reed is back on antibiotics for hidradenitis. We will continue to monitor symptoms as they relate to her weight loss journey.  3. Type 2 diabetes mellitus with other specified complication, without long-term current use of insulin ([facility]) Diabetes Mellitus: Not at goal. Medication: Mounjaro 2.5 mg subcutaneously weekly.   Plan: The patient will continue to focus on protein-rich, low simple carbohydrate foods. We reviewed the importance of hydration, regular exercise for stress reduction, and restorative sleep.   Lab Results  Component Value Date   HGBA1C 7.7 (H) 05/02/2021   HGBA1C 6.1 (H) 10/29/2020   HGBA1C 7.2 (H) 08/27/2015   Lab Results  Component Value Date   LDLCALC 145 (H) 05/02/2021   CREATININE 0.72 05/02/2021   4. Hyperlipidemia associated with type 2 diabetes mellitus ([place]) Course: Not at goal. Lipid-lowering medications: None.   Plan: Dietary changes: Increase soluble fiber, decrease simple carbohydrates, decrease saturated fat. Exercise changes: Moderate to vigorous-intensity aerobic activity 150 minutes  per week or as tolerated. We will continue to monitor along with PCP/specialists as it pertains to her weight loss journey.  Lab Results  Component Value Date   CHOL 229 (H) 05/02/2021   HDL 65 05/02/2021   LDLCALC 145 (H) 05/02/2021   TRIG 108 05/02/2021   Lab Results  Component Value Date   ALT 10 05/02/2021   AST 12 05/02/2021   ALKPHOS 75 05/02/2021   BILITOT 0.4 05/02/2021   The 10-year ASCVD risk score Mindy Bussing DC Jr., et al., 2013) is: 1.6%   Values used to calculate the score:     Age: 40 years     Sex: Female     Is Non-Hispanic African American: No     Diabetic: Yes     Tobacco smoker: No     Systolic Blood Pressure: [id number] mmHg     Is BP treated: Yes     HDL Cholesterol: 65 mg/dL     Total Cholesterol: 229 mg/dL  5. PCOS (polycystic ovarian syndrome) Uncontrolled.  She will continue to focus on protein-rich, low simple carbohydrate foods. We reviewed the importance of hydration, regular exercise for stress reduction, and restorative sleep. Medication: None.  Counseling PCOS is a leading cause of menstrual irregularities and infertility. It is also associated with obesity, hirsutism (excessive hair growth on the face, chest, or back), and cardiovascular risk factors such as high cholesterol and insulin resistance. Insulin resistance appears to play a central role.  Women with PCOS have been shown to have impaired appetite-regulating hormones. Women with polycystic ovary syndrome (PCOS) have an increased risk for cardiovascular disease (CVD) - European Journal of Preventive Cardiology.  - Start spironolactone (ALDACTONE)  50 MG tablet; Take 1 tablet (50 mg total) by mouth daily.  Dispense: 30 tablet; Refill: 0  6. Attention deficit hyperactivity disorder (ADHD), combined type History of diagnosis. Her ADHD seems to be associated with impulsive eating. Will start her on Vyvanse 40 mg daily for ADHD.  - Start lisdexamfetamine (VYVANSE) 40 MG capsule; Take 1 capsule (40 mg  total) by mouth every morning.  Dispense: 30 capsule; Refill: 0  I have consulted the [place] Controlled Substances Registry for this patient, and feel the risk/benefit ratio today is favorable for proceeding with this prescription for a controlled substance. The patient understands monitoring parameters and red flags.   7. At risk for heart disease Due to Mindy Reed's current state of health and medical condition(s), she is at a higher risk for heart disease.  This puts the patient at much greater risk to subsequently develop cardiopulmonary conditions that can significantly affect patient's quality of life in a negative manner.    At least 8 minutes were spent on counseling Mindy Reed about these concerns today. Evidence-based interventions for health behavior change were utilized today including the discussion of self monitoring techniques, problem-solving barriers, and SMART goal setting techniques.  Specifically, regarding patient's less desirable eating habits and patterns, we employed the technique of small changes when Mindy Reed has not been able to fully commit to her prudent nutritional plan.  8. Obesity, current BMI 53.3  Course: Mindy Reed is currently in the action stage of change. As such, her goal is to continue with weight loss efforts.   Nutrition goals: She has agreed to the Category 3 Plan.   Exercise goals:  As is.  Behavioral modification strategies: increasing lean protein intake, decreasing simple carbohydrates, increasing vegetables, and increasing water intake.  Mindy Reed has agreed to follow-up with our clinic in 2 weeks. She was informed of the importance of frequent follow-up visits to maximize her success with intensive lifestyle modifications for her multiple health conditions.   Objective:   Blood pressure 135/84, pulse 79, temperature 98.9 F (37.2 C), temperature source Oral, height '5\' 7"'$  (1.702 m), weight (!) 340 lb (154.2 kg), last menstrual period 04/28/2021, SpO2 93 %, unknown if  currently breastfeeding. Body mass index is 53.25 kg/m.  General: Cooperative, alert, well developed, in no acute distress. HEENT: Conjunctivae and lids unremarkable. Cardiovascular: Regular rhythm.  Lungs: Normal work of breathing. Neurologic: No focal deficits.   Lab Results  Component Value Date   CREATININE 0.72 05/02/2021   BUN 8 05/02/2021   NA 140 05/02/2021   K 3.3 (L) 05/02/2021   CL 92 (L) 05/02/2021   CO2 26 05/02/2021   Lab Results  Component Value Date   ALT 10 05/02/2021   AST 12 05/02/2021   ALKPHOS 75 05/02/2021   BILITOT 0.4 05/02/2021   Lab Results  Component Value Date   HGBA1C 7.7 (H) 05/02/2021   HGBA1C 6.1 (H) 10/29/2020   HGBA1C 7.2 (H) 08/27/2015   Lab Results  Component Value Date   INSULIN 17.8 05/02/2021   Lab Results  Component Value Date   TSH 3.530 05/02/2021   Lab Results  Component Value Date   CHOL 229 (H) 05/02/2021   HDL 65 05/02/2021   LDLCALC 145 (H) 05/02/2021   TRIG 108 05/02/2021   Lab Results  Component Value Date   VD25OH 44.5 05/02/2021   Lab Results  Component Value Date   WBC 7.9 05/02/2021   HGB 13.6 05/02/2021   HCT 39.9 05/02/2021   MCV 85 05/02/2021  PLT 440 05/02/2021   Lab Results  Component Value Date   IRON 68 05/02/2021   TIBC 243 (L) 05/02/2021   FERRITIN 91 05/02/2021   Attestation Statements:   Reviewed by clinician on day of visit: allergies, medications, problem list, medical history, surgical history, family history, social history, and previous encounter notes.  I, Water quality scientist, CMA, am acting as transcriptionist for Briscoe Deutscher, DO  I have reviewed the above documentation for accuracy and completeness, and I agree with the above. Briscoe Deutscher, DO

## 2021-06-04 ENCOUNTER — Encounter (INDEPENDENT_AMBULATORY_CARE_PROVIDER_SITE_OTHER): Payer: Self-pay | Admitting: Family Medicine

## 2021-06-04 NOTE — Telephone Encounter (Signed)
Pt last seen by Dr. Wallace.  

## 2021-06-06 ENCOUNTER — Other Ambulatory Visit: Payer: Self-pay

## 2021-06-06 ENCOUNTER — Ambulatory Visit (INDEPENDENT_AMBULATORY_CARE_PROVIDER_SITE_OTHER): Payer: BC Managed Care – PPO | Admitting: Family Medicine

## 2021-06-06 ENCOUNTER — Other Ambulatory Visit (INDEPENDENT_AMBULATORY_CARE_PROVIDER_SITE_OTHER): Payer: Self-pay | Admitting: Family Medicine

## 2021-06-06 ENCOUNTER — Encounter (INDEPENDENT_AMBULATORY_CARE_PROVIDER_SITE_OTHER): Payer: Self-pay | Admitting: Family Medicine

## 2021-06-06 VITALS — BP 131/75 | HR 64 | Temp 98.1°F | Ht 67.0 in | Wt 337.0 lb

## 2021-06-06 DIAGNOSIS — E1169 Type 2 diabetes mellitus with other specified complication: Secondary | ICD-10-CM

## 2021-06-06 DIAGNOSIS — F902 Attention-deficit hyperactivity disorder, combined type: Secondary | ICD-10-CM | POA: Diagnosis not present

## 2021-06-06 DIAGNOSIS — M25561 Pain in right knee: Secondary | ICD-10-CM | POA: Diagnosis not present

## 2021-06-06 DIAGNOSIS — Z9189 Other specified personal risk factors, not elsewhere classified: Secondary | ICD-10-CM

## 2021-06-06 DIAGNOSIS — Z6841 Body Mass Index (BMI) 40.0 and over, adult: Secondary | ICD-10-CM

## 2021-06-06 DIAGNOSIS — E66813 Obesity, class 3: Secondary | ICD-10-CM

## 2021-06-06 DIAGNOSIS — G8929 Other chronic pain: Secondary | ICD-10-CM

## 2021-06-06 MED ORDER — LISDEXAMFETAMINE DIMESYLATE 40 MG PO CAPS: 40.0000 mg | ORAL_CAPSULE | Freq: Every morning | ORAL | 0 refills | Status: DC

## 2021-06-06 MED ORDER — TIRZEPATIDE 5 MG/0.5ML ~~LOC~~ SOAJ: 5.0000 mg | SUBCUTANEOUS | 1 refills | Status: DC

## 2021-06-06 NOTE — Telephone Encounter (Signed)
Last OV with Dr Wallace 

## 2021-06-09 ENCOUNTER — Encounter (INDEPENDENT_AMBULATORY_CARE_PROVIDER_SITE_OTHER): Payer: Self-pay | Admitting: Family Medicine

## 2021-06-10 NOTE — Progress Notes (Signed)
Chief Complaint:   OBESITY Mindy Reed is here to discuss her progress with her obesity treatment plan along with follow-up of her obesity related diagnoses.   Today's visit was #: 3 Starting weight: 345 lbs Starting date: 05/02/2021 Today's weight: 337 lbs Today's date: 06/06/2021 Weight change since last visit: 2 lbs Total lbs lost to date: 8 lbs Body mass index is 52.78 kg/m.  Total weight loss percentage to date: -2.32%  Current Meal Plan: the Category 3 Plan for 50% of the time.  Current Exercise Plan: Cardio and strength training for 30-60 minutes 3 times per week. Current Anti-Obesity Medications: Mounjaro 2.5 mg subcutaneously weekly. Side effects: None.  Interim History:  Mindy Reed has had a cortisone injection in her right knee.  She endorses increased polyphagia and increased fluid retention.  Orthopedics suggested Trilock brace for ankle when exercising.  She says that Mindy Reed is helping - she has to force breakfast.  She is not night eating.  She says that Benefiber is controlling her constipation.  Assessment/Plan:   1. Type 2 diabetes mellitus with other specified complication, without long-term current use of insulin (HCC) Diabetes Mellitus: Not at goal. Medication: Mounjaro 2.5 mg subcutaneously weekly.   Plan:  Increase and refill Mounjaro to 5 mg subcutaneously weekly. The patient will continue to focus on protein-rich, low simple carbohydrate foods. We reviewed the importance of hydration, regular exercise for stress reduction, and restorative sleep.   Lab Results  Component Value Date   HGBA1C 7.7 (H) 05/02/2021   HGBA1C 6.1 (H) 10/29/2020   HGBA1C 7.2 (H) 08/27/2015   Lab Results  Component Value Date   LDLCALC 145 (H) 05/02/2021   CREATININE 0.72 05/02/2021   - Increase and refill tirzepatide (MOUNJARO) 5 MG/0.5ML Pen; Inject 5 mg into the skin once a week.  Dispense: 2 mL; Refill: 1  2. Chronic pain of right knee She has recently had a cortisone  injection in her knee. We will continue to monitor symptoms as they relate to her weight loss journey.  3. Attention deficit hyperactivity disorder (ADHD), combined type Improved. [name] is taking Vyvanse 40 mg daily for ADHD.  Will refill today at current dose.  I have consulted the [place] Controlled Substances Registry for this patient, and feel the risk/benefit ratio today is favorable for proceeding with this prescription for a controlled substance. The patient understands monitoring parameters and red flags.   - Refill lisdexamfetamine (VYVANSE) 40 MG capsule; Take 1 capsule (40 mg total) by mouth every morning.  Dispense: 30 capsule; Refill: 0  4. At risk for heart disease Due to [name]'s current state of health and medical condition(s), she is at a higher risk for heart disease.  This puts the patient at much greater risk to subsequently develop cardiopulmonary conditions that can significantly affect patient's quality of life in a negative manner.    At least 8 minutes were spent on counseling Mindy Reed about these concerns today. Evidence-based interventions for health behavior change were utilized today including the discussion of self monitoring techniques, problem-solving barriers, and SMART goal setting techniques.  Specifically, regarding patient's less desirable eating habits and patterns, we employed the technique of small changes when Mindy Reed has not been able to fully commit to her prudent nutritional plan.  5. Obesity, current BMI 52.8  Course: Mindy Reed is currently in the action stage of change. As such, her goal is to continue with weight loss efforts.   Nutrition goals: She has agreed to the Category 3 Plan.  Exercise goals:  As is.  Behavioral modification strategies: increasing lean protein intake, decreasing simple carbohydrates, and increasing vegetables.  Mindy Reed has agreed to follow-up with our clinic in 3 weeks. She was informed of the importance of frequent follow-up visits to  maximize her success with intensive lifestyle modifications for her multiple health conditions.   Objective:   Blood pressure 131/75, pulse 64, temperature 98.1 F (36.7 C), temperature source Oral, height '5\' 7"'$  (1.702 m), weight (!) 337 lb (152.9 kg), SpO2 95 %, unknown if currently breastfeeding. Body mass index is 52.78 kg/m.  General: Cooperative, alert, well developed, in no acute distress. HEENT: Conjunctivae and lids unremarkable. Cardiovascular: Regular rhythm.  Lungs: Normal work of breathing. Neurologic: No focal deficits.   Lab Results  Component Value Date   CREATININE 0.72 05/02/2021   BUN 8 05/02/2021   NA 140 05/02/2021   K 3.3 (L) 05/02/2021   CL 92 (L) 05/02/2021   CO2 26 05/02/2021   Lab Results  Component Value Date   ALT 10 05/02/2021   AST 12 05/02/2021   ALKPHOS 75 05/02/2021   BILITOT 0.4 05/02/2021   Lab Results  Component Value Date   HGBA1C 7.7 (H) 05/02/2021   HGBA1C 6.1 (H) 10/29/2020   HGBA1C 7.2 (H) 08/27/2015   Lab Results  Component Value Date   INSULIN 17.8 05/02/2021   Lab Results  Component Value Date   TSH 3.530 05/02/2021   Lab Results  Component Value Date   CHOL 229 (H) 05/02/2021   HDL 65 05/02/2021   LDLCALC 145 (H) 05/02/2021   TRIG 108 05/02/2021   Lab Results  Component Value Date   VD25OH 44.5 05/02/2021   Lab Results  Component Value Date   WBC 7.9 05/02/2021   HGB 13.6 05/02/2021   HCT 39.9 05/02/2021   MCV 85 05/02/2021   PLT 440 05/02/2021   Lab Results  Component Value Date   IRON 68 05/02/2021   TIBC 243 (L) 05/02/2021   FERRITIN 91 05/02/2021   Attestation Statements:   Reviewed by clinician on day of visit: allergies, medications, problem list, medical history, surgical history, family history, social history, and previous encounter notes.  I, Water quality scientist, CMA, am acting as transcriptionist for Briscoe Deutscher, DO  I have reviewed the above documentation for accuracy and completeness, and I  agree with the above. Briscoe Deutscher, DO

## 2021-06-10 NOTE — Telephone Encounter (Signed)
Last OV with Dr Wallace 

## 2021-06-12 NOTE — Telephone Encounter (Signed)
Has her rx been changed to [facility]? I did a PA for [facility] which was denied. Looks like has been taking Mounjaro and received the savings card information from you. Please advise.

## 2021-06-17 ENCOUNTER — Encounter (INDEPENDENT_AMBULATORY_CARE_PROVIDER_SITE_OTHER): Payer: Self-pay | Admitting: Family Medicine

## 2021-06-17 NOTE — Telephone Encounter (Signed)
Last OV with Dr Wallace 

## 2021-06-20 ENCOUNTER — Other Ambulatory Visit (INDEPENDENT_AMBULATORY_CARE_PROVIDER_SITE_OTHER): Payer: Self-pay | Admitting: Family Medicine

## 2021-06-20 ENCOUNTER — Encounter (INDEPENDENT_AMBULATORY_CARE_PROVIDER_SITE_OTHER): Payer: Self-pay

## 2021-06-20 DIAGNOSIS — E282 Polycystic ovarian syndrome: Secondary | ICD-10-CM

## 2021-06-20 NOTE — Telephone Encounter (Signed)
Msg sent to pt 

## 2021-06-20 NOTE — Telephone Encounter (Signed)
Dr.Wallace °

## 2021-06-23 ENCOUNTER — Other Ambulatory Visit (INDEPENDENT_AMBULATORY_CARE_PROVIDER_SITE_OTHER): Payer: Self-pay | Admitting: Family Medicine

## 2021-06-23 DIAGNOSIS — F902 Attention-deficit hyperactivity disorder, combined type: Secondary | ICD-10-CM

## 2021-06-24 ENCOUNTER — Encounter (INDEPENDENT_AMBULATORY_CARE_PROVIDER_SITE_OTHER): Payer: Self-pay | Admitting: Family Medicine

## 2021-06-25 NOTE — Telephone Encounter (Signed)
Pt last seen by Dr. Wallace.  

## 2021-06-25 NOTE — Telephone Encounter (Signed)
Dr.wallace °

## 2021-06-25 NOTE — Telephone Encounter (Signed)
Last OV with Dr Wallace 

## 2021-06-26 ENCOUNTER — Encounter (INDEPENDENT_AMBULATORY_CARE_PROVIDER_SITE_OTHER): Payer: Self-pay | Admitting: Family Medicine

## 2021-06-26 MED ORDER — LISDEXAMFETAMINE DIMESYLATE 40 MG PO CAPS: 40.0000 mg | ORAL_CAPSULE | Freq: Every morning | ORAL | 0 refills | Status: DC

## 2021-07-07 ENCOUNTER — Encounter (INDEPENDENT_AMBULATORY_CARE_PROVIDER_SITE_OTHER): Payer: Self-pay | Admitting: Family Medicine

## 2021-07-08 ENCOUNTER — Encounter (INDEPENDENT_AMBULATORY_CARE_PROVIDER_SITE_OTHER): Payer: Self-pay | Admitting: Family Medicine

## 2021-07-08 MED ORDER — TIRZEPATIDE 7.5 MG/0.5ML ~~LOC~~ SOAJ: 7.5000 mg | SUBCUTANEOUS | 0 refills | Status: DC

## 2021-07-08 NOTE — Telephone Encounter (Signed)
Last seen by Dr. Wallace. 

## 2021-07-08 NOTE — Telephone Encounter (Signed)
Last OV with Dr Wallace 

## 2021-07-14 ENCOUNTER — Other Ambulatory Visit (INDEPENDENT_AMBULATORY_CARE_PROVIDER_SITE_OTHER): Payer: Self-pay | Admitting: Family Medicine

## 2021-07-14 DIAGNOSIS — E282 Polycystic ovarian syndrome: Secondary | ICD-10-CM

## 2021-07-15 ENCOUNTER — Encounter (INDEPENDENT_AMBULATORY_CARE_PROVIDER_SITE_OTHER): Payer: Self-pay | Admitting: Family Medicine

## 2021-07-15 ENCOUNTER — Ambulatory Visit (INDEPENDENT_AMBULATORY_CARE_PROVIDER_SITE_OTHER): Payer: BC Managed Care – PPO | Admitting: Family Medicine

## 2021-07-15 ENCOUNTER — Other Ambulatory Visit: Payer: Self-pay

## 2021-07-15 VITALS — BP 133/80 | HR 74 | Temp 98.4°F | Ht 67.0 in | Wt 313.0 lb

## 2021-07-15 DIAGNOSIS — E282 Polycystic ovarian syndrome: Secondary | ICD-10-CM | POA: Diagnosis not present

## 2021-07-15 DIAGNOSIS — E1169 Type 2 diabetes mellitus with other specified complication: Secondary | ICD-10-CM | POA: Diagnosis not present

## 2021-07-15 DIAGNOSIS — G8929 Other chronic pain: Secondary | ICD-10-CM

## 2021-07-15 DIAGNOSIS — I10 Essential (primary) hypertension: Secondary | ICD-10-CM

## 2021-07-15 DIAGNOSIS — E66813 Obesity, class 3: Secondary | ICD-10-CM

## 2021-07-15 DIAGNOSIS — Z9189 Other specified personal risk factors, not elsewhere classified: Secondary | ICD-10-CM

## 2021-07-15 DIAGNOSIS — M25561 Pain in right knee: Secondary | ICD-10-CM | POA: Diagnosis not present

## 2021-07-15 DIAGNOSIS — F908 Attention-deficit hyperactivity disorder, other type: Secondary | ICD-10-CM

## 2021-07-15 DIAGNOSIS — Z6841 Body Mass Index (BMI) 40.0 and over, adult: Secondary | ICD-10-CM

## 2021-07-15 MED ORDER — TIRZEPATIDE 7.5 MG/0.5ML ~~LOC~~ SOAJ
7.5000 mg | SUBCUTANEOUS | 0 refills | Status: DC
Start: 2021-07-15 — End: 2021-08-05

## 2021-07-15 MED ORDER — VYVANSE 60 MG PO CHEW: 60.0000 mg | CHEWABLE_TABLET | Freq: Every day | ORAL | 0 refills | Status: DC

## 2021-07-15 MED ORDER — SPIRONOLACTONE 50 MG PO TABS: 50.0000 mg | ORAL_TABLET | Freq: Every day | ORAL | 3 refills | Status: DC

## 2021-07-15 MED ORDER — HYDROCHLOROTHIAZIDE 25 MG PO TABS: 25.0000 mg | ORAL_TABLET | Freq: Every day | ORAL | 0 refills | Status: DC

## 2021-07-15 NOTE — Progress Notes (Signed)
Chief Complaint:   OBESITY Mindy Reed is here to discuss her progress with her obesity treatment plan along with follow-up of her obesity related diagnoses. See Medical Weight Management Flowsheet for complete bioelectrical impedance results.  Today's visit was #: 4 Starting weight: 345 lbs Starting date: 05/02/2021 Weight change since last visit: 24 lbs Total lbs lost to date: 32 lbs Total weight loss percentage to date: -9.28%  Nutrition Plan: Category 3 Plan for 100% of the time. Activity: Increased walking. Anti-obesity medications: Mounjaro 7.5 mg subcutaneously weekly. Reported side effects: None.  Interim History: Mindy Reed is scheduled for an MRI on her knee tomorrow.  She had constipation, but it has resolved with Colace.  For breakfast, she has a protein shake with fiber.  She says she made up a new dish - "cabbagetti".  She is drinking more than 64 ounces of water per day and sets a timer for eating.  She is getting a bariatric vitamin.  Assessment/Plan:   1. Essential hypertension, with edema At goal. Medications: HCTZ 25 mg daily, spironolactone 50 mg daily..   Plan: Avoid buying foods that are: processed, frozen, or prepackaged to avoid excess salt. We will watch for signs of hypotension as she continues lifestyle modifications.  BP Readings from Last 3 Encounters:  07/15/21 133/80  06/06/21 131/75  05/20/21 135/84   Lab Results  Component Value Date   CREATININE 0.72 05/02/2021   - Refill hydrochlorothiazide (HYDRODIURIL) 25 MG tablet; Take 1 tablet (25 mg total) by mouth daily.  Dispense: 30 tablet; Refill: 0  2. Chronic pain of right knee She is having an MRI tomorrow. We will continue to monitor symptoms as they relate to her weight loss journey.  3. Type 2 diabetes mellitus with other specified complication, without long-term current use of insulin (HCC) Diabetes Mellitus: Not at goal. Medication: Mounjaro 7.5 mg subcutaneously weekly.   Plan:  Continue  Mounjaro.  Will refill today.  The patient will continue to focus on protein-rich, low simple carbohydrate foods. We reviewed the importance of hydration, regular exercise for stress reduction, and restorative sleep.   Lab Results  Component Value Date   HGBA1C 7.7 (H) 05/02/2021   HGBA1C 6.1 (H) 10/29/2020   HGBA1C 7.2 (H) 08/27/2015   Lab Results  Component Value Date   LDLCALC 145 (H) 05/02/2021   CREATININE 0.72 05/02/2021   - Refill tirzepatide (MOUNJARO) 7.5 MG/0.5ML Pen; Inject 7.5 mg into the skin once a week.  Dispense: 2 mL; Refill: 0  4. PCOS (polycystic ovarian syndrome) She will continue to focus on protein-rich, low simple carbohydrate foods. We reviewed the importance of hydration, regular exercise for stress reduction, and restorative sleep. Medication: spironolactone 50 mg daily.  Plan:  Refill spironolactone 50 mg daily, as per below.  - Refill spironolactone (ALDACTONE) 50 MG tablet; Take 1 tablet (50 mg total) by mouth daily.  Dispense: 30 tablet; Refill: 3  5. Attention deficit hyperactivity disorder (ADHD), other type, with impusive eating She is taking Vyvanse 40 mg daily.  Mindy Reed says she crashes in mid afternoon.  Plan:  Increase Vyvanse to 60 mg daily, as per below.   - Increase and refill Lisdexamfetamine Dimesylate (VYVANSE) 60 MG CHEW; Chew 60 mg by mouth daily at 12 noon.  Dispense: 30 tablet; Refill: 0  I have consulted the [place] Controlled Substances Registry for this patient, and feel the risk/benefit ratio today is favorable for proceeding with this prescription for a controlled substance. The patient understands monitoring parameters and  red flags.   6. At risk for heart disease Due to Mindy Reed's current state of health and medical condition(s), she is at a higher risk for heart disease.  This puts the patient at much greater risk to subsequently develop cardiopulmonary conditions that can significantly affect patient's quality of life in a negative manner.     At least 8 minutes were spent on counseling Mindy Reed about these concerns today. Evidence-based interventions for health behavior change were utilized today including the discussion of self monitoring techniques, problem-solving barriers, and SMART goal setting techniques.  Specifically, regarding patient's less desirable eating habits and patterns, we employed the technique of small changes when Mindy Reed has not been able to fully commit to her prudent nutritional plan.  7. Obesity, current BMI 49.1  Course: Mindy Reed is currently in the action stage of change. As such, her goal is to continue with weight loss efforts.   Nutrition goals: She has agreed to practicing portion control and making smarter food choices, such as increasing vegetables and decreasing simple carbohydrates.   Exercise goals:  As is.  Behavioral modification strategies: increasing lean protein intake, decreasing simple carbohydrates, increasing vegetables, and increasing water intake.  [name] has agreed to follow-up with our clinic in 4 weeks. She was informed of the importance of frequent follow-up visits to maximize her success with intensive lifestyle modifications for her multiple health conditions.   Objective:   Blood pressure 133/80, pulse 74, temperature 98.4 F (36.9 C), temperature source Oral, height 5\' 7"  (1.702 m), weight (!) 313 lb (142 kg), SpO2 99 %, unknown if currently breastfeeding. Body mass index is 49.02 kg/m.  General: Cooperative, alert, well developed, in no acute distress. HEENT: Conjunctivae and lids unremarkable. Cardiovascular: Regular rhythm.  Lungs: Normal work of breathing. Neurologic: No focal deficits.   Lab Results  Component Value Date   CREATININE 0.72 05/02/2021   BUN 8 05/02/2021   NA 140 05/02/2021   K 3.3 (L) 05/02/2021   CL 92 (L) 05/02/2021   CO2 26 05/02/2021   Lab Results  Component Value Date   ALT 10 05/02/2021   AST 12 05/02/2021   ALKPHOS 75 05/02/2021    BILITOT 0.4 05/02/2021   Lab Results  Component Value Date   HGBA1C 7.7 (H) 05/02/2021   HGBA1C 6.1 (H) 10/29/2020   HGBA1C 7.2 (H) 08/27/2015   Lab Results  Component Value Date   INSULIN 17.8 05/02/2021   Lab Results  Component Value Date   TSH 3.530 05/02/2021   Lab Results  Component Value Date   CHOL 229 (H) 05/02/2021   HDL 65 05/02/2021   LDLCALC 145 (H) 05/02/2021   TRIG 108 05/02/2021   Lab Results  Component Value Date   VD25OH 44.5 05/02/2021   Lab Results  Component Value Date   WBC 7.9 05/02/2021   HGB 13.6 05/02/2021   HCT 39.9 05/02/2021   MCV 85 05/02/2021   PLT 440 05/02/2021   Lab Results  Component Value Date   IRON 68 05/02/2021   TIBC 243 (L) 05/02/2021   FERRITIN 91 05/02/2021   Attestation Statements:   Reviewed by clinician on day of visit: allergies, medications, problem list, medical history, surgical history, family history, social history, and previous encounter notes.  I, Water quality scientist, CMA, am acting as transcriptionist for Briscoe Deutscher, DO  I have reviewed the above documentation for accuracy and completeness, and I agree with the above. Briscoe Deutscher, DO

## 2021-07-15 NOTE — Telephone Encounter (Signed)
OV with Dr Juleen China today

## 2021-07-19 ENCOUNTER — Encounter (INDEPENDENT_AMBULATORY_CARE_PROVIDER_SITE_OTHER): Payer: Self-pay | Admitting: Family Medicine

## 2021-07-19 DIAGNOSIS — K5903 Drug induced constipation: Secondary | ICD-10-CM

## 2021-07-21 ENCOUNTER — Encounter (INDEPENDENT_AMBULATORY_CARE_PROVIDER_SITE_OTHER): Payer: Self-pay | Admitting: Family Medicine

## 2021-07-22 ENCOUNTER — Encounter (INDEPENDENT_AMBULATORY_CARE_PROVIDER_SITE_OTHER): Payer: Self-pay

## 2021-07-22 NOTE — Telephone Encounter (Signed)
Prior authorization has been started for Vyvanse. Will notify patient and provider once response is received.

## 2021-07-24 ENCOUNTER — Encounter (INDEPENDENT_AMBULATORY_CARE_PROVIDER_SITE_OTHER): Payer: Self-pay | Admitting: Family Medicine

## 2021-07-26 MED ORDER — LINACLOTIDE 145 MCG PO CAPS: 145.0000 ug | ORAL_CAPSULE | Freq: Every day | ORAL | 0 refills | Status: DC

## 2021-07-28 ENCOUNTER — Encounter (INDEPENDENT_AMBULATORY_CARE_PROVIDER_SITE_OTHER): Payer: Self-pay | Admitting: Family Medicine

## 2021-07-29 NOTE — Telephone Encounter (Signed)
Please advise 

## 2021-07-30 ENCOUNTER — Encounter (INDEPENDENT_AMBULATORY_CARE_PROVIDER_SITE_OTHER): Payer: Self-pay | Admitting: Family Medicine

## 2021-07-31 NOTE — Telephone Encounter (Signed)
Last OV with Dr Wallace 

## 2021-08-03 ENCOUNTER — Encounter (INDEPENDENT_AMBULATORY_CARE_PROVIDER_SITE_OTHER): Payer: Self-pay | Admitting: Family Medicine

## 2021-08-03 DIAGNOSIS — E1169 Type 2 diabetes mellitus with other specified complication: Secondary | ICD-10-CM

## 2021-08-05 MED ORDER — TIRZEPATIDE 7.5 MG/0.5ML ~~LOC~~ SOAJ: 7.5000 mg | SUBCUTANEOUS | 0 refills | Status: DC

## 2021-08-05 NOTE — Telephone Encounter (Signed)
Dr.Wallace °

## 2021-08-09 ENCOUNTER — Other Ambulatory Visit (INDEPENDENT_AMBULATORY_CARE_PROVIDER_SITE_OTHER): Payer: Self-pay | Admitting: Family Medicine

## 2021-08-09 DIAGNOSIS — I10 Essential (primary) hypertension: Secondary | ICD-10-CM

## 2021-08-11 ENCOUNTER — Encounter (INDEPENDENT_AMBULATORY_CARE_PROVIDER_SITE_OTHER): Payer: Self-pay

## 2021-08-12 ENCOUNTER — Encounter (INDEPENDENT_AMBULATORY_CARE_PROVIDER_SITE_OTHER): Payer: Self-pay

## 2021-08-12 ENCOUNTER — Ambulatory Visit (INDEPENDENT_AMBULATORY_CARE_PROVIDER_SITE_OTHER): Payer: BC Managed Care – PPO | Admitting: Family Medicine

## 2021-08-19 ENCOUNTER — Encounter (INDEPENDENT_AMBULATORY_CARE_PROVIDER_SITE_OTHER): Payer: Self-pay | Admitting: Family Medicine

## 2021-08-20 ENCOUNTER — Other Ambulatory Visit (INDEPENDENT_AMBULATORY_CARE_PROVIDER_SITE_OTHER): Payer: Self-pay | Admitting: Family Medicine

## 2021-08-20 DIAGNOSIS — F908 Attention-deficit hyperactivity disorder, other type: Secondary | ICD-10-CM

## 2021-08-20 NOTE — Telephone Encounter (Signed)
Dr.Wallace °

## 2021-08-21 ENCOUNTER — Other Ambulatory Visit (INDEPENDENT_AMBULATORY_CARE_PROVIDER_SITE_OTHER): Payer: Self-pay | Admitting: Family Medicine

## 2021-08-21 DIAGNOSIS — I10 Essential (primary) hypertension: Secondary | ICD-10-CM

## 2021-08-21 MED ORDER — VYVANSE 60 MG PO CHEW: 60.0000 mg | CHEWABLE_TABLET | Freq: Every day | ORAL | 0 refills | Status: DC

## 2021-08-22 ENCOUNTER — Other Ambulatory Visit (INDEPENDENT_AMBULATORY_CARE_PROVIDER_SITE_OTHER): Payer: Self-pay | Admitting: Family Medicine

## 2021-08-22 DIAGNOSIS — I10 Essential (primary) hypertension: Secondary | ICD-10-CM

## 2021-08-22 NOTE — Telephone Encounter (Signed)
Last OV with Dr Wallace 

## 2021-08-25 ENCOUNTER — Encounter (INDEPENDENT_AMBULATORY_CARE_PROVIDER_SITE_OTHER): Payer: Self-pay | Admitting: Family Medicine

## 2021-08-25 DIAGNOSIS — E1169 Type 2 diabetes mellitus with other specified complication: Secondary | ICD-10-CM

## 2021-08-26 NOTE — Telephone Encounter (Signed)
Pt last seen by Dr. Wallace.  

## 2021-08-28 MED ORDER — TIRZEPATIDE 10 MG/0.5ML ~~LOC~~ SOAJ: 10.0000 mg | SUBCUTANEOUS | 0 refills | Status: DC

## 2021-09-01 ENCOUNTER — Encounter (INDEPENDENT_AMBULATORY_CARE_PROVIDER_SITE_OTHER): Payer: Self-pay | Admitting: Family Medicine

## 2021-09-02 NOTE — Telephone Encounter (Signed)
Juleen China Fab idea!

## 2021-09-08 ENCOUNTER — Other Ambulatory Visit (INDEPENDENT_AMBULATORY_CARE_PROVIDER_SITE_OTHER): Payer: Self-pay | Admitting: Family Medicine

## 2021-09-08 DIAGNOSIS — K5903 Drug induced constipation: Secondary | ICD-10-CM

## 2021-09-10 ENCOUNTER — Encounter (INDEPENDENT_AMBULATORY_CARE_PROVIDER_SITE_OTHER): Payer: Self-pay | Admitting: Family Medicine

## 2021-09-10 MED ORDER — LINACLOTIDE 145 MCG PO CAPS: 145.0000 ug | ORAL_CAPSULE | Freq: Every day | ORAL | 0 refills | Status: DC

## 2021-09-15 ENCOUNTER — Other Ambulatory Visit (INDEPENDENT_AMBULATORY_CARE_PROVIDER_SITE_OTHER): Payer: Self-pay | Admitting: Family Medicine

## 2021-09-15 DIAGNOSIS — I10 Essential (primary) hypertension: Secondary | ICD-10-CM

## 2021-09-16 ENCOUNTER — Encounter (INDEPENDENT_AMBULATORY_CARE_PROVIDER_SITE_OTHER): Payer: Self-pay | Admitting: Family Medicine

## 2021-09-16 ENCOUNTER — Ambulatory Visit (INDEPENDENT_AMBULATORY_CARE_PROVIDER_SITE_OTHER): Payer: BC Managed Care – PPO | Admitting: Family Medicine

## 2021-09-16 ENCOUNTER — Other Ambulatory Visit: Payer: Self-pay

## 2021-09-16 VITALS — BP 122/80 | HR 78 | Temp 98.1°F | Ht 67.0 in | Wt 285.0 lb

## 2021-09-16 DIAGNOSIS — Z6841 Body Mass Index (BMI) 40.0 and over, adult: Secondary | ICD-10-CM

## 2021-09-16 DIAGNOSIS — E1169 Type 2 diabetes mellitus with other specified complication: Secondary | ICD-10-CM

## 2021-09-16 DIAGNOSIS — F419 Anxiety disorder, unspecified: Secondary | ICD-10-CM

## 2021-09-16 DIAGNOSIS — M25561 Pain in right knee: Secondary | ICD-10-CM

## 2021-09-16 DIAGNOSIS — E66813 Obesity, class 3: Secondary | ICD-10-CM

## 2021-09-16 DIAGNOSIS — F908 Attention-deficit hyperactivity disorder, other type: Secondary | ICD-10-CM

## 2021-09-16 DIAGNOSIS — G4709 Other insomnia: Secondary | ICD-10-CM

## 2021-09-16 DIAGNOSIS — I10 Essential (primary) hypertension: Secondary | ICD-10-CM

## 2021-09-16 DIAGNOSIS — G8929 Other chronic pain: Secondary | ICD-10-CM

## 2021-09-16 DIAGNOSIS — F32A Depression, unspecified: Secondary | ICD-10-CM

## 2021-09-16 MED ORDER — TRAZODONE HCL 100 MG PO TABS: 100.0000 mg | ORAL_TABLET | Freq: Every day | ORAL | 1 refills | Status: DC

## 2021-09-16 MED ORDER — BUPROPION HCL 75 MG PO TABS: 75.0000 mg | ORAL_TABLET | Freq: Every day | ORAL | 1 refills | Status: DC

## 2021-09-16 MED ORDER — TIRZEPATIDE 10 MG/0.5ML ~~LOC~~ SOAJ: 10.0000 mg | SUBCUTANEOUS | 0 refills | Status: DC

## 2021-09-16 MED ORDER — VYVANSE 60 MG PO CHEW
60.0000 mg | CHEWABLE_TABLET | Freq: Every day | ORAL | 0 refills | Status: DC
Start: 2021-09-16 — End: 2021-09-23

## 2021-09-16 NOTE — Telephone Encounter (Signed)
Pt last seen by Dr. Wallace.  

## 2021-09-17 NOTE — Progress Notes (Signed)
Chief Complaint:   OBESITY Mindy Reed is here to discuss her progress with her obesity treatment plan along with follow-up of her obesity related diagnoses. See Medical Weight Management Flowsheet for complete bioelectrical impedance results.  Today's visit was #: 5 Starting weight: 345 lbs Starting date: 05/02/2021 Weight change since last visit: 28 lbs Total lbs lost to date: 60 lbs Total weight loss percentage to date: -17.39%  Nutrition Plan: Practicing portion control and making smarter food choices, such as increasing vegetables and decreasing simple carbohydrates for 100% of the time. Activity: None. Anti-obesity medications: Mounjaro 10 mg subcutaneously weekly. Reported side effects: None.  Interim History: Mindy Reed is down 60 pounds.  She has not had any spironolactone since Sunday due to low blood pressure.  She says she had labs done at [facility] and her LFTs were mildly elevated, A1c 5.2.  She is status post gel shots bilateral knees and can exercise again in around 20 days.    Assessment/Plan:   1. Type 2 diabetes mellitus with other specified complication, without long-term current use of insulin (HCC) Diabetes Mellitus: Not at goal. Medication: Mounjaro 10 mg subcutaneously weekly. Issues reviewed: blood sugar goals, complications of diabetes mellitus, hypoglycemia prevention and treatment, exercise, and nutrition.  Plan:  Continue Mounjaro 10 mg subcutaneously weekly. The patient will continue to focus on protein-rich, low simple carbohydrate foods. We reviewed the importance of hydration, regular exercise for stress reduction, and restorative sleep.   Lab Results  Component Value Date   HGBA1C 7.7 (H) 05/02/2021   HGBA1C 6.1 (H) 10/29/2020   HGBA1C 7.2 (H) 08/27/2015   Lab Results  Component Value Date   LDLCALC 145 (H) 05/02/2021   CREATININE 0.72 05/02/2021   - Refill tirzepatide (MOUNJARO) 10 MG/0.5ML Pen; Inject 10 mg into the skin once a week.  Dispense: 2 mL;  Refill: 0  2. Essential hypertension, with edema At goal. Medications: HCTZ 25 mg daily, spironolactone 50 mg daily (stopped due to low blood pressure).   Plan:  Stop spironolactone. Avoid buying foods that are: processed, frozen, or prepackaged to avoid excess salt. We will watch for signs of hypotension as she continues lifestyle modifications.  BP Readings from Last 3 Encounters:  09/16/21 122/80  07/15/21 133/80  06/06/21 131/75   Lab Results  Component Value Date   CREATININE 0.72 05/02/2021   3. Other insomnia This is poorly controlled.  Current treatment: trazodone 50 mg at bedtime.  Plan:  Increase trazodone to 100 mg at bedtime for sleep. Recommend sleep hygiene measures including regular sleep schedule, optimal sleep environment, and relaxing presleep rituals.   - Increase traZODone (DESYREL) 100 MG tablet; Take 1 tablet (100 mg total) by mouth at bedtime.  Dispense: 90 tablet; Refill: 1  4. Chronic pain of right knee Mindy Reed is status post gel injections. We will continue to monitor symptoms as they relate to her weight loss journey.  5. Attention deficit hyperactivity disorder (ADHD), other type, with impusive eating Medication:Vyvanse 60 mg daily.  Counseling ADHD is the most missed diagnosis in relation to food and appetite problems. Often the strong urge to binge or to self-medicate with food subsides once the impulsivity and inattention of ADHD are treated. A person can experience a new ability to tune in to the body's signals, control cravings and improve impulse control.  A deficiency in norepinephrine and dopamine can lead to the following behaviors related to eating: Poor awareness of internal cues of hunger and satiety, or fullness. Inability to follow  a meal plan. Inability to judge portion size accurately. Inability to stop bingeing or purging. Distraction by continual thoughts of food, weight and body shape. Increased desire to overeat, especially high  calorie, "reward" type foods. Poor self-esteem due to repeated failures of self-control.  Plan: Continue Vyvanse 60 mg daily.  Will refill today, as per below.  - Refill Lisdexamfetamine Dimesylate (VYVANSE) 60 MG CHEW; Chew 60 mg by mouth daily at 12 noon.  Dispense: 30 tablet; Refill: 0  I have consulted the [place] Controlled Substances Registry for this patient, and feel the risk/benefit ratio today is favorable for proceeding with this prescription for a controlled substance. The patient understands monitoring parameters and red flags.   6. Anxiety and depression, with emotional eating Controlled. Medication: Wellbutrin 150 mg daily.   Plan:  Decrease Wellbutrin to 75 mg daily, as per below.  Discussed cues and consequences, how thoughts affect eating, model of thoughts, feelings, and behaviors, and strategies for change by focusing on the cue. Discussed cognitive distortions, coping thoughts, and how to change your thoughts.  - Decrease buPROPion (WELLBUTRIN) 75 MG tablet; Take 1 tablet (75 mg total) by mouth daily at 12 noon.  Dispense: 30 tablet; Refill: 1  7. Obesity BMI today is 44  Course: Mindy Reed is currently in the action stage of change. As such, her goal is to continue with weight loss efforts.   Nutrition goals: She has agreed to practicing portion control and making smarter food choices, such as increasing vegetables and decreasing simple carbohydrates.   Exercise goals:  Plans to start exercising with friends in 3 weeks.  Behavioral modification strategies: increasing lean protein intake, decreasing simple carbohydrates, increasing vegetables, and increasing water intake.  Mindy Reed has agreed to follow-up with our clinic in 4 weeks. She was informed of the importance of frequent follow-up visits to maximize her success with intensive lifestyle modifications for her multiple health conditions.   Objective:   Blood pressure 122/80, pulse 78, temperature 98.1 F (36.7 C),  temperature source Oral, height 5\' 7"  (1.702 m), weight 285 lb (129.3 kg), SpO2 99 %, unknown if currently breastfeeding. Body mass index is 44.64 kg/m.  General: Cooperative, alert, well developed, in no acute distress. HEENT: Conjunctivae and lids unremarkable. Cardiovascular: Regular rhythm.  Lungs: Normal work of breathing. Neurologic: No focal deficits.   Lab Results  Component Value Date   CREATININE 0.72 05/02/2021   BUN 8 05/02/2021   NA 140 05/02/2021   K 3.3 (L) 05/02/2021   CL 92 (L) 05/02/2021   CO2 26 05/02/2021   Lab Results  Component Value Date   ALT 10 05/02/2021   AST 12 05/02/2021   ALKPHOS 75 05/02/2021   BILITOT 0.4 05/02/2021   Lab Results  Component Value Date   HGBA1C 7.7 (H) 05/02/2021   HGBA1C 6.1 (H) 10/29/2020   HGBA1C 7.2 (H) 08/27/2015   Lab Results  Component Value Date   INSULIN 17.8 05/02/2021   Lab Results  Component Value Date   TSH 3.530 05/02/2021   Lab Results  Component Value Date   CHOL 229 (H) 05/02/2021   HDL 65 05/02/2021   LDLCALC 145 (H) 05/02/2021   TRIG 108 05/02/2021   Lab Results  Component Value Date   VD25OH 44.5 05/02/2021   Lab Results  Component Value Date   WBC 7.9 05/02/2021   HGB 13.6 05/02/2021   HCT 39.9 05/02/2021   MCV 85 05/02/2021   PLT 440 05/02/2021   Lab Results  Component Value  Date   IRON 68 05/02/2021   TIBC 243 (L) 05/02/2021   FERRITIN 91 05/02/2021   Attestation Statements:   Reviewed by clinician on day of visit: allergies, medications, problem list, medical history, surgical history, family history, social history, and previous encounter notes.  Time spent on visit including pre-visit chart review and post-visit care and documentation was 43 minutes. Time was spent on: Food choices and timing of food intake reviewed today. I discussed a personalized meal plan with the patient that will help her to lose weight and will improve her obesity-related conditions going forward. I  performed a medically necessary appropriate examination and/or evaluation. I discussed the assessment and treatment plan with the patient. Motivational interviewing as well as evidence-based interventions for health behavior change were utilized today including the discussion of self monitoring techniques, problem-solving barriers and SMART goal setting techniques.  An exercise prescription was reviewed.  The patient was provided an opportunity to ask questions and all were answered. The patient agreed with the plan and demonstrated an understanding of the instructions. Clinical information was updated and documented in the EMR.  I, Water quality scientist, CMA, am acting as transcriptionist for Briscoe Deutscher, DO  I have reviewed the above documentation for accuracy and completeness, and I agree with the above. -  Briscoe Deutscher, DO, MS, FAAFP, DABOM - Family and Bariatric Medicine.

## 2021-09-21 ENCOUNTER — Other Ambulatory Visit (INDEPENDENT_AMBULATORY_CARE_PROVIDER_SITE_OTHER): Payer: Self-pay | Admitting: Family Medicine

## 2021-09-21 DIAGNOSIS — E1169 Type 2 diabetes mellitus with other specified complication: Secondary | ICD-10-CM

## 2021-09-22 ENCOUNTER — Encounter (INDEPENDENT_AMBULATORY_CARE_PROVIDER_SITE_OTHER): Payer: Self-pay | Admitting: Family Medicine

## 2021-09-22 DIAGNOSIS — F908 Attention-deficit hyperactivity disorder, other type: Secondary | ICD-10-CM

## 2021-09-22 DIAGNOSIS — K5903 Drug induced constipation: Secondary | ICD-10-CM

## 2021-09-22 DIAGNOSIS — I10 Essential (primary) hypertension: Secondary | ICD-10-CM

## 2021-09-22 DIAGNOSIS — E1169 Type 2 diabetes mellitus with other specified complication: Secondary | ICD-10-CM

## 2021-09-23 MED ORDER — HYDROCHLOROTHIAZIDE 25 MG PO TABS: 25.0000 mg | ORAL_TABLET | Freq: Every day | ORAL | 0 refills | Status: DC

## 2021-09-23 MED ORDER — TIRZEPATIDE 10 MG/0.5ML ~~LOC~~ SOAJ: 10.0000 mg | SUBCUTANEOUS | 0 refills | Status: DC

## 2021-09-23 MED ORDER — VYVANSE 60 MG PO CHEW
60.0000 mg | CHEWABLE_TABLET | Freq: Every day | ORAL | 0 refills | Status: DC
Start: 2021-09-23 — End: 2021-10-23

## 2021-09-23 MED ORDER — LINACLOTIDE 145 MCG PO CAPS: 145.0000 ug | ORAL_CAPSULE | Freq: Every day | ORAL | 0 refills | Status: DC

## 2021-09-23 NOTE — Telephone Encounter (Signed)
Pt last seen by Dr. Wallace.  

## 2021-09-25 ENCOUNTER — Encounter (INDEPENDENT_AMBULATORY_CARE_PROVIDER_SITE_OTHER): Payer: Self-pay | Admitting: Family Medicine

## 2021-09-25 NOTE — Telephone Encounter (Signed)
Dr.wallace °

## 2021-09-30 ENCOUNTER — Other Ambulatory Visit (INDEPENDENT_AMBULATORY_CARE_PROVIDER_SITE_OTHER): Payer: Self-pay | Admitting: Family Medicine

## 2021-09-30 ENCOUNTER — Encounter (INDEPENDENT_AMBULATORY_CARE_PROVIDER_SITE_OTHER): Payer: Self-pay | Admitting: Family Medicine

## 2021-09-30 DIAGNOSIS — K5903 Drug induced constipation: Secondary | ICD-10-CM

## 2021-09-30 NOTE — Telephone Encounter (Signed)
Made in error

## 2021-10-02 ENCOUNTER — Encounter (INDEPENDENT_AMBULATORY_CARE_PROVIDER_SITE_OTHER): Payer: Self-pay | Admitting: Family Medicine

## 2021-10-02 DIAGNOSIS — E1169 Type 2 diabetes mellitus with other specified complication: Secondary | ICD-10-CM

## 2021-10-03 MED ORDER — TIRZEPATIDE 12.5 MG/0.5ML ~~LOC~~ SOAJ: 12.5000 mg | SUBCUTANEOUS | 0 refills | Status: DC

## 2021-10-08 ENCOUNTER — Other Ambulatory Visit (INDEPENDENT_AMBULATORY_CARE_PROVIDER_SITE_OTHER): Payer: Self-pay | Admitting: Family Medicine

## 2021-10-08 DIAGNOSIS — F32A Depression, unspecified: Secondary | ICD-10-CM

## 2021-10-08 NOTE — Telephone Encounter (Signed)
LOV w/ Dr. Juleen China

## 2021-10-18 ENCOUNTER — Other Ambulatory Visit (INDEPENDENT_AMBULATORY_CARE_PROVIDER_SITE_OTHER): Payer: Self-pay | Admitting: Family Medicine

## 2021-10-18 DIAGNOSIS — I10 Essential (primary) hypertension: Secondary | ICD-10-CM

## 2021-10-23 ENCOUNTER — Encounter (INDEPENDENT_AMBULATORY_CARE_PROVIDER_SITE_OTHER): Payer: Self-pay | Admitting: Family Medicine

## 2021-10-23 ENCOUNTER — Ambulatory Visit (INDEPENDENT_AMBULATORY_CARE_PROVIDER_SITE_OTHER): Payer: BC Managed Care – PPO | Admitting: Family Medicine

## 2021-10-23 ENCOUNTER — Other Ambulatory Visit: Payer: Self-pay

## 2021-10-23 VITALS — BP 121/76 | HR 74 | Temp 97.8°F | Ht 67.0 in | Wt 285.0 lb

## 2021-10-23 DIAGNOSIS — G4709 Other insomnia: Secondary | ICD-10-CM

## 2021-10-23 DIAGNOSIS — G8929 Other chronic pain: Secondary | ICD-10-CM

## 2021-10-23 DIAGNOSIS — B354 Tinea corporis: Secondary | ICD-10-CM

## 2021-10-23 DIAGNOSIS — M25561 Pain in right knee: Secondary | ICD-10-CM

## 2021-10-23 DIAGNOSIS — I10 Essential (primary) hypertension: Secondary | ICD-10-CM

## 2021-10-23 DIAGNOSIS — E1169 Type 2 diabetes mellitus with other specified complication: Secondary | ICD-10-CM

## 2021-10-23 DIAGNOSIS — Z6841 Body Mass Index (BMI) 40.0 and over, adult: Secondary | ICD-10-CM

## 2021-10-23 DIAGNOSIS — T23002A Burn of unspecified degree of left hand, unspecified site, initial encounter: Secondary | ICD-10-CM

## 2021-10-23 DIAGNOSIS — F908 Attention-deficit hyperactivity disorder, other type: Secondary | ICD-10-CM

## 2021-10-23 DIAGNOSIS — K5903 Drug induced constipation: Secondary | ICD-10-CM

## 2021-10-23 MED ORDER — MUPIROCIN 2 % EX OINT: 1.0000 "application " | TOPICAL_OINTMENT | Freq: Two times a day (BID) | CUTANEOUS | 0 refills | Status: DC

## 2021-10-23 MED ORDER — TIRZEPATIDE 15 MG/0.5ML ~~LOC~~ SOAJ: 15.0000 mg | SUBCUTANEOUS | 2 refills | Status: DC

## 2021-10-23 MED ORDER — LINACLOTIDE 145 MCG PO CAPS: 145.0000 ug | ORAL_CAPSULE | Freq: Every day | ORAL | 2 refills | Status: DC

## 2021-10-23 MED ORDER — FLUCONAZOLE 150 MG PO TABS: 150.0000 mg | ORAL_TABLET | ORAL | 0 refills | Status: DC

## 2021-10-23 MED ORDER — TRAZODONE HCL 100 MG PO TABS: 100.0000 mg | ORAL_TABLET | Freq: Every day | ORAL | 1 refills | Status: DC

## 2021-10-23 MED ORDER — VYVANSE 60 MG PO CHEW: 60.0000 mg | CHEWABLE_TABLET | Freq: Every day | ORAL | 0 refills | Status: DC

## 2021-10-23 MED ORDER — HYDROCHLOROTHIAZIDE 25 MG PO TABS: 25.0000 mg | ORAL_TABLET | Freq: Every day | ORAL | 1 refills | Status: DC

## 2021-10-24 ENCOUNTER — Encounter (INDEPENDENT_AMBULATORY_CARE_PROVIDER_SITE_OTHER): Payer: Self-pay | Admitting: Family Medicine

## 2021-10-24 DIAGNOSIS — I10 Essential (primary) hypertension: Secondary | ICD-10-CM

## 2021-10-24 MED ORDER — TRIAMTERENE-HCTZ 75-50 MG PO TABS: 1.0000 | ORAL_TABLET | Freq: Every day | ORAL | 0 refills | Status: DC

## 2021-10-24 NOTE — Telephone Encounter (Signed)
Pt last seen by Dr. Wallace.  

## 2021-10-24 NOTE — Progress Notes (Signed)
Chief Complaint:   OBESITY Mindy Reed is here to discuss her progress with her obesity treatment plan along with follow-up of her obesity related diagnoses. See Medical Weight Management Flowsheet for complete bioelectrical impedance results.  Today's visit was #: 6 Starting weight: 345 lbs Starting date: 05/02/2021 Weight change since last visit: 0 Total lbs lost to date: 60 lbs Total weight loss percentage to date: -17.39%  Nutrition Plan: Practicing portion control and making smarter food choices, such as increasing vegetables and decreasing simple carbohydrates for 100% of the time.  Activity: None. Anti-obesity medications: Mounjaro 12.5 mg subcutaneously weekly. Reported side effects: None.  Interim History: Aylin is up in water weight.  She has not been exercising as much secondary to knee instability.  She says her word of the year is "shift".  Assessment/Plan:   1. Type 2 diabetes mellitus with other specified complication, without long-term current use of insulin (HCC) Diabetes Mellitus: Not at goal. Medication: Mounjaro 12.5 mg subcutaneously weekly. Issues reviewed: blood sugar goals, complications of diabetes mellitus, hypoglycemia prevention and treatment, exercise, and nutrition.  Plan:  Increase Mounjaro to 15 mg subcutaneously weekly. The patient will continue to focus on protein-rich, low simple carbohydrate foods. We reviewed the importance of hydration, regular exercise for stress reduction, and restorative sleep.   Lab Results  Component Value Date   HGBA1C 7.7 (H) 05/02/2021   HGBA1C 6.1 (H) 10/29/2020   HGBA1C 7.2 (H) 08/27/2015   Lab Results  Component Value Date   LDLCALC 145 (H) 05/02/2021   CREATININE 0.72 05/02/2021   - Increase tirzepatide (MOUNJARO) 15 MG/0.5ML Pen; Inject 15 mg into the skin once a week.  Dispense: 6 mL; Refill: 2  2. Burn of left hand, first degree, initial encounter Start mupirocin 2% ointment twice daily.  - Start mupirocin  ointment (BACTROBAN) 2 %; Apply 1 application topically 2 (two) times daily.  Dispense: 22 g; Refill: 0  3. Tinea corporis, left thigh Start Diflucan 150 mg once weekly for 5 weeks.  - Start fluconazole (DIFLUCAN) 150 MG tablet; Take 1 tablet (150 mg total) by mouth once a week.  Dispense: 4 tablet; Refill: 0  4. Essential hypertension, with edema At goal. Medications: HCTZ 25 mg daily.   Plan:  Continue HCTZ.  Will refill today.  Avoid buying foods that are: processed, frozen, or prepackaged to avoid excess salt. We will watch for signs of hypotension as she continues lifestyle modifications.  BP Readings from Last 3 Encounters:  10/23/21 121/76  09/16/21 122/80  07/15/21 133/80   Lab Results  Component Value Date   CREATININE 0.72 05/02/2021   - Refill hydrochlorothiazide (HYDRODIURIL) 25 MG tablet; Take 1 tablet (25 mg total) by mouth daily.  Dispense: 90 tablet; Refill: 1  5. Drug-induced constipation Kaylianna is taking Linzess 145 mcg daily for constipation.  She will continue this dose.  Refilled today.  - Refill linaclotide (LINZESS) 145 MCG CAPS capsule; Take 1 capsule (145 mcg total) by mouth daily before breakfast.  Dispense: 30 capsule; Refill: 2  6. Other insomnia This is well controlled.  Current treatment: trazodone 100 mg at bedtime.  Plan: Recommend sleep hygiene measures including regular sleep schedule, optimal sleep environment, and relaxing presleep rituals.   - Refill traZODone (DESYREL) 100 MG tablet; Take 1 tablet (100 mg total) by mouth at bedtime.  Dispense: 90 tablet; Refill: 1  7. Chronic pain of right knee, with instability She will call her Orthopedist. We will continue to monitor symptoms as  they relate to her weight loss journey.  8. Attention deficit hyperactivity disorder (ADHD), other type, with impusive eating Medication: Vyvanse 60 mg daily.  Counseling ADHD is the most missed diagnosis in relation to food and appetite problems. Often the  strong urge to binge or to self-medicate with food subsides once the impulsivity and inattention of ADHD are treated. A person can experience a new ability to tune in to the body's signals, control cravings and improve impulse control.  A deficiency in norepinephrine and dopamine can lead to the following behaviors related to eating: Poor awareness of internal cues of hunger and satiety, or fullness. Inability to follow a meal plan. Inability to judge portion size accurately. Inability to stop bingeing or purging. Distraction by continual thoughts of food, weight and body shape. Increased desire to overeat, especially high calorie, "reward" type foods. Poor self-esteem due to repeated failures of self-control.  Plan: Continue Vyvanse 60 mg daily.  Will refill today.  - Refill Lisdexamfetamine Dimesylate (VYVANSE) 60 MG CHEW; Chew 60 mg by mouth daily at 12 noon.  Dispense: 30 tablet; Refill: 0  I have consulted the [place] Controlled Substances Registry for this patient, and feel the risk/benefit ratio today is favorable for proceeding with this prescription for a controlled substance. The patient understands monitoring parameters and red flags.   9. Obesity BMI today is 44.7  Course: Angellica is currently in the action stage of change. As such, her goal is to continue with weight loss efforts.   Nutrition goals: She has agreed to practicing portion control and making smarter food choices, such as increasing vegetables and decreasing simple carbohydrates.   Exercise goals:  As is.  Behavioral modification strategies: increasing lean protein intake, decreasing simple carbohydrates, increasing vegetables, increasing water intake, and decreasing liquid calories.  Denell has agreed to follow-up with our clinic in 4 weeks. She was informed of the importance of frequent follow-up visits to maximize her success with intensive lifestyle modifications for her multiple health conditions.   Nobie was  informed we would discuss her lab results at her next visit unless there is a critical issue that needs to be addressed sooner. Mazie agreed to keep her next visit at the agreed upon time to discuss these results.  Objective:   Blood pressure 121/76, pulse 74, temperature 97.8 F (36.6 C), temperature source Oral, height 5\' 7"  (1.702 m), weight 285 lb (129.3 kg), SpO2 95 %, unknown if currently breastfeeding. Body mass index is 44.64 kg/m.  General: Cooperative, alert, well developed, in no acute distress. HEENT: Conjunctivae and lids unremarkable. Cardiovascular: Regular rhythm.  Lungs: Normal work of breathing. Neurologic: No focal deficits.   Lab Results  Component Value Date   CREATININE 0.72 05/02/2021   BUN 8 05/02/2021   NA 140 05/02/2021   K 3.3 (L) 05/02/2021   CL 92 (L) 05/02/2021   CO2 26 05/02/2021   Lab Results  Component Value Date   ALT 10 05/02/2021   AST 12 05/02/2021   ALKPHOS 75 05/02/2021   BILITOT 0.4 05/02/2021   Lab Results  Component Value Date   HGBA1C 7.7 (H) 05/02/2021   HGBA1C 6.1 (H) 10/29/2020   HGBA1C 7.2 (H) 08/27/2015   Lab Results  Component Value Date   INSULIN 17.8 05/02/2021   Lab Results  Component Value Date   TSH 3.530 05/02/2021   Lab Results  Component Value Date   CHOL 229 (H) 05/02/2021   HDL 65 05/02/2021   LDLCALC 145 (H) 05/02/2021  TRIG 108 05/02/2021   Lab Results  Component Value Date   VD25OH 44.5 05/02/2021   Lab Results  Component Value Date   WBC 7.9 05/02/2021   HGB 13.6 05/02/2021   HCT 39.9 05/02/2021   MCV 85 05/02/2021   PLT 440 05/02/2021   Lab Results  Component Value Date   IRON 68 05/02/2021   TIBC 243 (L) 05/02/2021   FERRITIN 91 05/02/2021   Attestation Statements:   Reviewed by clinician on day of visit: allergies, medications, problem list, medical history, surgical history, family history, social history, and previous encounter notes.  I, Water quality scientist, CMA, am acting as  transcriptionist for Briscoe Deutscher, DO  I have reviewed the above documentation for accuracy and completeness, and I agree with the above. -  Briscoe Deutscher, DO, MS, FAAFP, DABOM - Family and Bariatric Medicine.

## 2021-10-25 ENCOUNTER — Encounter (INDEPENDENT_AMBULATORY_CARE_PROVIDER_SITE_OTHER): Payer: Self-pay | Admitting: Family Medicine

## 2021-10-26 ENCOUNTER — Encounter (INDEPENDENT_AMBULATORY_CARE_PROVIDER_SITE_OTHER): Payer: Self-pay | Admitting: Family Medicine

## 2021-10-28 NOTE — Telephone Encounter (Signed)
Pt last seen by Dr. Wallace.  

## 2021-10-30 ENCOUNTER — Encounter (INDEPENDENT_AMBULATORY_CARE_PROVIDER_SITE_OTHER): Payer: Self-pay | Admitting: Family Medicine

## 2021-10-31 ENCOUNTER — Encounter (INDEPENDENT_AMBULATORY_CARE_PROVIDER_SITE_OTHER): Payer: Self-pay | Admitting: Family Medicine

## 2021-11-04 ENCOUNTER — Encounter (INDEPENDENT_AMBULATORY_CARE_PROVIDER_SITE_OTHER): Payer: Self-pay | Admitting: Family Medicine

## 2021-11-07 ENCOUNTER — Encounter (INDEPENDENT_AMBULATORY_CARE_PROVIDER_SITE_OTHER): Payer: Self-pay | Admitting: Family Medicine

## 2021-11-07 ENCOUNTER — Other Ambulatory Visit (INDEPENDENT_AMBULATORY_CARE_PROVIDER_SITE_OTHER): Payer: Self-pay | Admitting: Family Medicine

## 2021-11-07 DIAGNOSIS — F32A Depression, unspecified: Secondary | ICD-10-CM

## 2021-11-07 DIAGNOSIS — F419 Anxiety disorder, unspecified: Secondary | ICD-10-CM

## 2021-11-10 ENCOUNTER — Other Ambulatory Visit (INDEPENDENT_AMBULATORY_CARE_PROVIDER_SITE_OTHER): Payer: Self-pay | Admitting: Family Medicine

## 2021-11-10 ENCOUNTER — Encounter (INDEPENDENT_AMBULATORY_CARE_PROVIDER_SITE_OTHER): Payer: Self-pay | Admitting: Family Medicine

## 2021-11-10 DIAGNOSIS — F908 Attention-deficit hyperactivity disorder, other type: Secondary | ICD-10-CM

## 2021-11-10 DIAGNOSIS — E282 Polycystic ovarian syndrome: Secondary | ICD-10-CM

## 2021-11-11 MED ORDER — VYVANSE 60 MG PO CHEW: 60.0000 mg | CHEWABLE_TABLET | Freq: Every day | ORAL | 0 refills | Status: DC

## 2021-11-11 NOTE — Telephone Encounter (Signed)
Dr.Wallace °

## 2021-11-12 MED ORDER — TIRZEPATIDE 12.5 MG/0.5ML ~~LOC~~ SOAJ: 12.5000 mg | SUBCUTANEOUS | 0 refills | Status: DC

## 2021-11-13 ENCOUNTER — Other Ambulatory Visit (INDEPENDENT_AMBULATORY_CARE_PROVIDER_SITE_OTHER): Payer: Self-pay | Admitting: Family Medicine

## 2021-11-13 ENCOUNTER — Encounter (INDEPENDENT_AMBULATORY_CARE_PROVIDER_SITE_OTHER): Payer: Self-pay | Admitting: Family Medicine

## 2021-11-13 DIAGNOSIS — F419 Anxiety disorder, unspecified: Secondary | ICD-10-CM

## 2021-11-13 NOTE — Telephone Encounter (Signed)
Dr.Wallace °

## 2021-11-17 ENCOUNTER — Other Ambulatory Visit (INDEPENDENT_AMBULATORY_CARE_PROVIDER_SITE_OTHER): Payer: Self-pay | Admitting: Family Medicine

## 2021-11-17 DIAGNOSIS — I10 Essential (primary) hypertension: Secondary | ICD-10-CM

## 2021-11-18 ENCOUNTER — Encounter (INDEPENDENT_AMBULATORY_CARE_PROVIDER_SITE_OTHER): Payer: Self-pay | Admitting: Family Medicine

## 2021-11-18 DIAGNOSIS — F32A Depression, unspecified: Secondary | ICD-10-CM

## 2021-11-18 DIAGNOSIS — F419 Anxiety disorder, unspecified: Secondary | ICD-10-CM

## 2021-11-18 NOTE — Telephone Encounter (Signed)
Dr.Wallace °

## 2021-11-18 NOTE — Telephone Encounter (Signed)
LAST APPOINTMENT DATE: 10/23/21 NEXT APPOINTMENT DATE: 11/25/21   [id number] - [place], [address] [place] [address] Phone: [phone] Fax: [phone]  Patient is requesting a refill of the following medications: Requested Prescriptions    No prescriptions requested or ordered in this encounter    Date last filled: 09/16/21 Previously prescribed by Dr. Veva Holes  Lab Results  Component Value Date   HGBA1C 7.7 (H) 05/02/2021   HGBA1C 6.1 (H) 10/29/2020   HGBA1C 7.2 (H) 08/27/2015   Lab Results  Component Value Date   LDLCALC 145 (H) 05/02/2021   CREATININE 0.72 05/02/2021   Lab Results  Component Value Date   VD25OH 44.5 05/02/2021    BP Readings from Last 3 Encounters:  10/23/21 121/76  09/16/21 122/80  07/15/21 133/80

## 2021-11-20 MED ORDER — BUPROPION HCL 75 MG PO TABS: 75.0000 mg | ORAL_TABLET | Freq: Every day | ORAL | 0 refills | Status: DC

## 2021-11-25 ENCOUNTER — Ambulatory Visit (INDEPENDENT_AMBULATORY_CARE_PROVIDER_SITE_OTHER): Payer: BC Managed Care – PPO | Admitting: Family Medicine

## 2021-11-25 ENCOUNTER — Encounter (INDEPENDENT_AMBULATORY_CARE_PROVIDER_SITE_OTHER): Payer: Self-pay | Admitting: Family Medicine

## 2021-11-25 ENCOUNTER — Other Ambulatory Visit: Payer: Self-pay

## 2021-11-25 VITALS — BP 111/75 | HR 84 | Temp 98.4°F | Ht 67.0 in | Wt 265.0 lb

## 2021-11-25 DIAGNOSIS — F908 Attention-deficit hyperactivity disorder, other type: Secondary | ICD-10-CM

## 2021-11-25 DIAGNOSIS — I10 Essential (primary) hypertension: Secondary | ICD-10-CM | POA: Diagnosis not present

## 2021-11-25 DIAGNOSIS — R1011 Right upper quadrant pain: Secondary | ICD-10-CM | POA: Diagnosis not present

## 2021-11-25 DIAGNOSIS — F32A Depression, unspecified: Secondary | ICD-10-CM

## 2021-11-25 DIAGNOSIS — G4709 Other insomnia: Secondary | ICD-10-CM

## 2021-11-25 DIAGNOSIS — K5903 Drug induced constipation: Secondary | ICD-10-CM

## 2021-11-25 DIAGNOSIS — E669 Obesity, unspecified: Secondary | ICD-10-CM

## 2021-11-25 DIAGNOSIS — F419 Anxiety disorder, unspecified: Secondary | ICD-10-CM

## 2021-11-25 DIAGNOSIS — Z6841 Body Mass Index (BMI) 40.0 and over, adult: Secondary | ICD-10-CM

## 2021-11-25 MED ORDER — LINACLOTIDE 145 MCG PO CAPS: 145.0000 ug | ORAL_CAPSULE | Freq: Every day | ORAL | 2 refills | Status: DC

## 2021-11-25 MED ORDER — TRAZODONE HCL 100 MG PO TABS: 100.0000 mg | ORAL_TABLET | Freq: Every day | ORAL | 1 refills | Status: DC

## 2021-11-25 MED ORDER — VYVANSE 60 MG PO CHEW: 60.0000 mg | CHEWABLE_TABLET | Freq: Every day | ORAL | 0 refills | Status: DC

## 2021-11-25 MED ORDER — BUPROPION HCL 75 MG PO TABS: 75.0000 mg | ORAL_TABLET | Freq: Every day | ORAL | 0 refills | Status: DC

## 2021-11-25 MED ORDER — TRIAMTERENE-HCTZ 75-50 MG PO TABS: 1.0000 | ORAL_TABLET | Freq: Every day | ORAL | 0 refills | Status: DC

## 2021-11-26 ENCOUNTER — Telehealth (INDEPENDENT_AMBULATORY_CARE_PROVIDER_SITE_OTHER): Payer: Self-pay

## 2021-11-26 LAB — COMPREHENSIVE METABOLIC PANEL
ALT: 12 IU/L (ref 0–32)
AST: 15 IU/L (ref 0–40)
Albumin/Globulin Ratio: 1.8 (ref 1.2–2.2)
Albumin: 4.6 g/dL (ref 3.8–4.8)
Alkaline Phosphatase: 67 IU/L (ref 44–121)
BUN/Creatinine Ratio: 12 (ref 9–23)
BUN: 12 mg/dL (ref 6–24)
Bilirubin Total: 0.5 mg/dL (ref 0.0–1.2)
CO2: 29 mmol/L (ref 20–29)
Calcium: 10 mg/dL (ref 8.7–10.2)
Chloride: 96 mmol/L (ref 96–106)
Creatinine, Ser: 1.03 mg/dL — ABNORMAL HIGH (ref 0.57–1.00)
Globulin, Total: 2.5 g/dL (ref 1.5–4.5)
Glucose: 80 mg/dL (ref 70–99)
Potassium: 3.6 mmol/L (ref 3.5–5.2)
Sodium: 141 mmol/L (ref 134–144)
Total Protein: 7.1 g/dL (ref 6.0–8.5)
eGFR: 70 mL/min/{1.73_m2} (ref >59)

## 2021-11-26 LAB — CBC WITH DIFFERENTIAL/PLATELET
Basophils Absolute: 0.1 10*3/uL (ref 0.0–0.2)
Basos: 1 %
EOS (ABSOLUTE): 0.1 10*3/uL (ref 0.0–0.4)
Eos: 1 %
Hematocrit: 42.7 % (ref 34.0–46.6)
Hemoglobin: 14.2 g/dL (ref 11.1–15.9)
Immature Grans (Abs): 0 10*3/uL (ref 0.0–0.1)
Immature Granulocytes: 0 %
Lymphocytes Absolute: 1.4 10*3/uL (ref 0.7–3.1)
Lymphs: 18 %
MCH: 29 pg (ref 26.6–33.0)
MCHC: 33.3 g/dL (ref 31.5–35.7)
MCV: 87 fL (ref 79–97)
Monocytes Absolute: 0.6 10*3/uL (ref 0.1–0.9)
Monocytes: 8 %
Neutrophils Absolute: 5.8 10*3/uL (ref 1.4–7.0)
Neutrophils: 72 %
Platelets: 463 10*3/uL — ABNORMAL HIGH (ref 150–450)
RBC: 4.9 x10E6/uL (ref 3.77–5.28)
RDW: 13 % (ref 11.7–15.4)
WBC: 8 10*3/uL (ref 3.4–10.8)

## 2021-11-26 LAB — LIPASE: Lipase: 50 U/L (ref 14–72)

## 2021-11-26 MED ORDER — VYVANSE 60 MG PO CHEW: 60.0000 mg | CHEWABLE_TABLET | Freq: Every day | ORAL | 0 refills | Status: DC

## 2021-11-26 MED ORDER — VYVANSE 60 MG PO CHEW: 60.0000 mg | CHEWABLE_TABLET | ORAL | 0 refills | Status: DC

## 2021-11-26 NOTE — Telephone Encounter (Signed)
[facility] with [facility] in [place] is calling regarding the prescriptions they received for patients [facility].  Gershon Mussel states he suspects the instructions are incorrect on all received.  Please advise.  Thank you

## 2021-11-26 NOTE — Progress Notes (Signed)
Chief Complaint:   OBESITY Mindy Reed is here to discuss her progress with her obesity treatment plan along with follow-up of her obesity related diagnoses. See Medical Weight Management Flowsheet for complete bioelectrical impedance results.  Today's visit was #: 7 Starting weight: 345 lbs Starting date: 05/02/2021 Weight change since last visit: 20 lbs Total lbs lost to date: 80 lbs Total weight loss percentage to date: -23.19%  Nutrition Plan: Practicing portion control and making smarter food choices, such as increasing vegetables and decreasing simple carbohydrates for 100% of the time. Activity: Walking more. Anti-obesity medications: Mounjaro 15 mg subcutaneously weekly. Reported side effects: None.  Interim History: Mounjaro.  She has had no Wellbutrin in the past week.  She says that her right knee is improving with use of a brace.  She endorses RUQ pain/tenderness.  She does not have her gallbladder.  Assessment/Plan:   1. Attention deficit hyperactivity disorder (ADHD), other type, with impusive eating Controlled. Medication:Vyvanse 60 mg daily.  Counseling ADHD is the most missed diagnosis in relation to food and appetite problems. Often the strong urge to binge or to self-medicate with food subsides once the impulsivity and inattention of ADHD are treated. A person can experience a new ability to tune in to the body's signals, control cravings and improve impulse control.  A deficiency in norepinephrine and dopamine can lead to the following behaviors related to eating: Poor awareness of internal cues of hunger and satiety, or fullness. Inability to follow a meal plan. Inability to judge portion size accurately. Inability to stop bingeing or purging. Distraction by continual thoughts of food, weight and body shape. Increased desire to overeat, especially high calorie, "reward" type foods. Poor self-esteem due to repeated failures of self-control.  Plan: Continue  Vyvanse 60 mg daily.  Will refill today for 3 months.  - Refill Lisdexamfetamine Dimesylate (VYVANSE) 60 MG CHEW; Chew 60 mg by mouth daily at 12 noon.  Dispense: 30 tablet; Refill: 0 - Lisdexamfetamine Dimesylate (VYVANSE) 60 MG CHEW; Chew 60 mg by mouth once a week.  Dispense: 30 tablet; Refill: 0 - Lisdexamfetamine Dimesylate (VYVANSE) 60 MG CHEW; Chew 60 mg by mouth once a week.  Dispense: 30 tablet; Refill: 0  2. Other insomnia Jenissa is taking trazodone 100 mg at bedtime for sleep.  She will continue this dose.  Will refill today.  - Refill traZODone (DESYREL) 100 MG tablet; Take 1 tablet (100 mg total) by mouth at bedtime.  Dispense: 90 tablet; Refill: 1  3. Essential hypertension At goal. Medications: Maxzide 75-50 mg daily.   Plan: Avoid buying foods that are: processed, frozen, or prepackaged to avoid excess salt. We will watch for signs of hypotension as she continues lifestyle modifications.  BP Readings from Last 3 Encounters:  11/25/21 111/75  10/23/21 121/76  09/16/21 122/80   Lab Results  Component Value Date   CREATININE 1.03 (H) 11/25/2021   - Refill triamterene-hydrochlorothiazide (MAXZIDE) 75-50 MG tablet; Take 1 tablet by mouth daily.  Dispense: 30 tablet; Refill: 0  4. RUQ pain Will check CBC, CMP, and lipase today, as per below.  - CBC with Differential/Platelet - Comprehensive metabolic panel - Lipase  5. Drug-induced constipation Magaly is taking Linzess 145 mcg daily.  Will refill this for her today.  Jaeleen was informed that a decrease in bowel movement frequency is normal while losing weight, but stools should not be hard or painful.  Counseling: Getting to Good Bowel Health: Your goal is to have one soft bowel  movement each day. Drink at least 8 glasses of water each day. Eat plenty of fiber (goal is over 30 grams each day). It is best to get most of your fiber from dietary sources which includes leafy green vegetables, fresh fruit, and whole grains. You  may need to add fiber with the help of OTC fiber supplements. These include Metamucil, Citrucel, and Benefiber. If you are still having trouble, try adding an osmotic laxative such as Miralax. If all of these changes do not work, Cabin crew.   - Refill linaclotide (LINZESS) 145 MCG CAPS capsule; Take 1 capsule (145 mcg total) by mouth daily before breakfast.  Dispense: 30 capsule; Refill: 2  6. Anxiety and depression, with emotional eating Controlled. Medication: Wellbutrin 75 mg daily.    Plan:  Continue Wellbutrin 75 mg daily, as per below.  Discussed cues and consequences, how thoughts affect eating, model of thoughts, feelings, and behaviors, and strategies for change by focusing on the cue. Discussed cognitive distortions, coping thoughts, and how to change your thoughts.  - Refill buPROPion (WELLBUTRIN) 75 MG tablet; Take 1 tablet (75 mg total) by mouth daily at 12 noon.  Dispense: 90 tablet; Refill: 0  7. Obesity BMI today is 41.6  Course: Chelci is currently in the action stage of change. As such, her goal is to continue with weight loss efforts.   Nutrition goals: She has agreed to practicing portion control and making smarter food choices, such as increasing vegetables and decreasing simple carbohydrates.   Exercise goals:  As is.  Behavioral modification strategies: increasing lean protein intake, decreasing simple carbohydrates, increasing vegetables, increasing water intake, and decreasing liquid calories.  Asees has agreed to follow-up with our clinic in 4 weeks. She was informed of the importance of frequent follow-up visits to maximize her success with intensive lifestyle modifications for her multiple health conditions.   Maronda was informed we would discuss her lab results at her next visit unless there is a critical issue that needs to be addressed sooner. Hargun agreed to keep her next visit at the agreed upon time to discuss these results.  Objective:   Blood  pressure 111/75, pulse 84, temperature 98.4 F (36.9 C), temperature source Oral, height 5\' 7"  (1.702 m), weight 265 lb (120.2 kg), SpO2 98 %, unknown if currently breastfeeding. Body mass index is 41.5 kg/m.  General: Cooperative, alert, well developed, in no acute distress. HEENT: Conjunctivae and lids unremarkable. Cardiovascular: Regular rhythm.  Lungs: Normal work of breathing. Neurologic: No focal deficits.   Lab Results  Component Value Date   CREATININE 1.03 (H) 11/25/2021   BUN 12 11/25/2021   NA 141 11/25/2021   K 3.6 11/25/2021   CL 96 11/25/2021   CO2 29 11/25/2021   Lab Results  Component Value Date   ALT 12 11/25/2021   AST 15 11/25/2021   ALKPHOS 67 11/25/2021   BILITOT 0.5 11/25/2021   Lab Results  Component Value Date   HGBA1C 7.7 (H) 05/02/2021   HGBA1C 6.1 (H) 10/29/2020   HGBA1C 7.2 (H) 08/27/2015   Lab Results  Component Value Date   INSULIN 17.8 05/02/2021   Lab Results  Component Value Date   TSH 3.530 05/02/2021   Lab Results  Component Value Date   CHOL 229 (H) 05/02/2021   HDL 65 05/02/2021   LDLCALC 145 (H) 05/02/2021   TRIG 108 05/02/2021   Lab Results  Component Value Date   VD25OH 44.5 05/02/2021   Lab Results  Component  Value Date   WBC 8.0 11/25/2021   HGB 14.2 11/25/2021   HCT 42.7 11/25/2021   MCV 87 11/25/2021   PLT 463 (H) 11/25/2021   Lab Results  Component Value Date   IRON 68 05/02/2021   TIBC 243 (L) 05/02/2021   FERRITIN 91 05/02/2021   Attestation Statements:   Reviewed by clinician on day of visit: allergies, medications, problem list, medical history, surgical history, family history, social history, and previous encounter notes.  Time spent on visit including pre-visit chart review and post-visit care and documentation was 44 minutes.Time was spent on: Food choices and timing of food intake reviewed today. I discussed a personalized meal plan with the patient that will help her to lose weight and will  improve her obesity-related conditions going forward. I performed a medically necessary appropriate examination and/or evaluation. I discussed the assessment and treatment plan with the patient. Motivational interviewing as well as evidence-based interventions for health behavior change were utilized today including the discussion of self monitoring techniques, problem-solving barriers and SMART goal setting techniques.  An exercise prescription was reviewed.  The patient was provided an opportunity to ask questions and all were answered. The patient agreed with the plan and demonstrated an understanding of the instructions. Clinical information was updated and documented in the EMR.  I, Water quality scientist, CMA, am acting as transcriptionist for Briscoe Deutscher, DO  I have reviewed the above documentation for accuracy and completeness, and I agree with the above. -  Briscoe Deutscher, DO, MS, FAAFP, DABOM - Family and Bariatric Medicine.

## 2021-12-03 ENCOUNTER — Encounter (INDEPENDENT_AMBULATORY_CARE_PROVIDER_SITE_OTHER): Payer: Self-pay | Admitting: Family Medicine

## 2021-12-03 NOTE — Telephone Encounter (Signed)
Dr.Wallace °

## 2021-12-04 ENCOUNTER — Encounter (INDEPENDENT_AMBULATORY_CARE_PROVIDER_SITE_OTHER): Payer: Self-pay | Admitting: Family Medicine

## 2021-12-09 ENCOUNTER — Encounter (INDEPENDENT_AMBULATORY_CARE_PROVIDER_SITE_OTHER): Payer: Self-pay | Admitting: Family Medicine

## 2021-12-16 ENCOUNTER — Encounter (INDEPENDENT_AMBULATORY_CARE_PROVIDER_SITE_OTHER): Payer: Self-pay | Admitting: Family Medicine

## 2021-12-16 NOTE — Telephone Encounter (Signed)
Dr.Wallace °

## 2021-12-17 ENCOUNTER — Other Ambulatory Visit (INDEPENDENT_AMBULATORY_CARE_PROVIDER_SITE_OTHER): Payer: Self-pay | Admitting: Family Medicine

## 2021-12-17 DIAGNOSIS — I10 Essential (primary) hypertension: Secondary | ICD-10-CM

## 2021-12-22 ENCOUNTER — Encounter (INDEPENDENT_AMBULATORY_CARE_PROVIDER_SITE_OTHER): Payer: Self-pay | Admitting: Family Medicine

## 2021-12-23 NOTE — Telephone Encounter (Signed)
Dr.Wallace °

## 2021-12-30 ENCOUNTER — Other Ambulatory Visit: Payer: Self-pay

## 2021-12-30 ENCOUNTER — Ambulatory Visit (INDEPENDENT_AMBULATORY_CARE_PROVIDER_SITE_OTHER): Payer: BC Managed Care – PPO | Admitting: Family Medicine

## 2021-12-30 ENCOUNTER — Encounter (INDEPENDENT_AMBULATORY_CARE_PROVIDER_SITE_OTHER): Payer: Self-pay | Admitting: Family Medicine

## 2021-12-30 VITALS — BP 121/80 | HR 78 | Temp 98.0°F | Ht 67.0 in | Wt 259.0 lb

## 2021-12-30 DIAGNOSIS — K5903 Drug induced constipation: Secondary | ICD-10-CM

## 2021-12-30 DIAGNOSIS — I10 Essential (primary) hypertension: Secondary | ICD-10-CM

## 2021-12-30 DIAGNOSIS — R102 Pelvic and perineal pain: Secondary | ICD-10-CM

## 2021-12-30 DIAGNOSIS — Z6841 Body Mass Index (BMI) 40.0 and over, adult: Secondary | ICD-10-CM

## 2021-12-30 DIAGNOSIS — E1169 Type 2 diabetes mellitus with other specified complication: Secondary | ICD-10-CM | POA: Diagnosis not present

## 2021-12-30 DIAGNOSIS — F32A Depression, unspecified: Secondary | ICD-10-CM

## 2021-12-30 DIAGNOSIS — F419 Anxiety disorder, unspecified: Secondary | ICD-10-CM

## 2021-12-30 DIAGNOSIS — Z7985 Long-term (current) use of injectable non-insulin antidiabetic drugs: Secondary | ICD-10-CM

## 2021-12-30 DIAGNOSIS — E669 Obesity, unspecified: Secondary | ICD-10-CM

## 2022-01-02 MED ORDER — LINACLOTIDE 145 MCG PO CAPS: 145.0000 ug | ORAL_CAPSULE | Freq: Every day | ORAL | 2 refills | Status: DC

## 2022-01-02 MED ORDER — TRIAMTERENE-HCTZ 75-50 MG PO TABS: 1.0000 | ORAL_TABLET | Freq: Every day | ORAL | 0 refills | Status: DC

## 2022-01-02 MED ORDER — BUPROPION HCL ER (XL) 150 MG PO TB24: 150.0000 mg | ORAL_TABLET | Freq: Every day | ORAL | 0 refills | Status: DC

## 2022-01-02 MED ORDER — TIRZEPATIDE 15 MG/0.5ML ~~LOC~~ SOAJ: 15.0000 mg | SUBCUTANEOUS | 2 refills | Status: DC

## 2022-01-04 ENCOUNTER — Encounter (INDEPENDENT_AMBULATORY_CARE_PROVIDER_SITE_OTHER): Payer: Self-pay | Admitting: Family Medicine

## 2022-01-06 NOTE — Telephone Encounter (Signed)
Last OV with Dr Wallace 

## 2022-01-07 ENCOUNTER — Encounter (INDEPENDENT_AMBULATORY_CARE_PROVIDER_SITE_OTHER): Payer: Self-pay | Admitting: Family Medicine

## 2022-01-07 NOTE — Progress Notes (Signed)
Chief Complaint:   OBESITY Mindy Reed is here to discuss her progress with her obesity treatment plan along with follow-up of her obesity related diagnoses. See Medical Weight Management Flowsheet for complete bioelectrical impedance results.  Today's visit was #: 8 Starting weight: 345 lbs Starting date: 05/02/2021 Weight change since last visit: 6 lbs Total lbs lost to date: 45 lbs Total weight loss percentage to date: -24.93%  Nutrition Plan: Practicing portion control and making smarter food choices, such as increasing vegetables and decreasing simple carbohydrates for 100% of the time. Activity: Cardio for 15 minutes 3 times per week.  Anti-obesity medications: Mounjaro 15 mg subcutaneously weekly. Reported side effects: None.  Interim History: [name] used to take Doxepin for sleep.  She says it worked.  She is not sure why she stopped taking it.  Assessment/Plan:   1. Type 2 diabetes mellitus with other specified complication, without long-term current use of insulin (HCC) Diabetes Mellitus: Not at goal. Medication: Mounjaro 15 mg subcutaneously weekly. Issues reviewed: blood sugar goals, complications of diabetes mellitus, hypoglycemia prevention and treatment, exercise, and nutrition.  Plan: Continue Mounjaro 15 mg subcutaneously weekly.  Will refill today, as per below.  The patient will continue to focus on protein-rich, low simple carbohydrate foods. We reviewed the importance of hydration, regular exercise for stress reduction, and restorative sleep.   Lab Results  Component Value Date   HGBA1C 7.7 (H) 05/02/2021   HGBA1C 6.1 (H) 10/29/2020   HGBA1C 7.2 (H) 08/27/2015   Lab Results  Component Value Date   LDLCALC 145 (H) 05/02/2021   CREATININE 1.03 (H) 11/25/2021   - Refill tirzepatide (MOUNJARO) 15 MG/0.5ML Pen; Inject 15 mg into the skin once a week.  Dispense: 6 mL; Refill: 2  2. Pelvic pain Order placed for US pelvis with transvaginal.  Zasha has an IUD in  place.  - US Pelvic Complete With Transvaginal; Future  3. Essential hypertension At goal. Medications: triamterene-HCTZ 75-50 mg daily.   Plan: Continue triamterene-HCTZ 75-50 mg daily.  Will refill today, as per below.  Avoid buying foods that are: processed, frozen, or prepackaged to avoid excess salt. We will watch for signs of hypotension as she continues lifestyle modifications.  BP Readings from Last 3 Encounters:  12/30/21 121/80  11/25/21 111/75  10/23/21 121/76   Lab Results  Component Value Date   CREATININE 1.03 (H) 11/25/2021   - Refill triamterene-hydrochlorothiazide (MAXZIDE) 75-50 MG tablet; Take 1 tablet by mouth daily.  Dispense: 30 tablet; Refill: 0  4. Drug-induced constipation Maybelle is taking Linzess 145 mcg daily.  Will refill this for her today.   [name] was informed that a decrease in bowel movement frequency is normal while losing weight, but stools should not be hard or painful.  Counseling: Getting to Good Bowel Health: Your goal is to have one soft bowel movement each day. Drink at least 8 glasses of water each day. Eat plenty of fiber (goal is over 30 grams each day). It is best to get most of your fiber from dietary sources which includes leafy green vegetables, fresh fruit, and whole grains. You may need to add fiber with the help of OTC fiber supplements. These include Metamucil, Citrucel, and Benefiber. If you are still having trouble, try adding an osmotic laxative such as Miralax. If all of these changes do not work, Cabin [employer].   - Refill linaclotide (LINZESS) 145 MCG CAPS capsule; Take 1 capsule (145 mcg total) by mouth daily before breakfast.  Dispense: 30 capsule; Refill: 2  5. Emotional eating tendencies Controlled. Medication: Wellbutrin 75 mg daily.    Plan:  Increase Wellbutrin XL to 150 mg daily. Discussed cues and consequences, how thoughts affect eating, model of thoughts, feelings, and behaviors, and strategies for change by  focusing on the cue. Discussed cognitive distortions, coping thoughts, and how to change your thoughts.  - Refill buPROPion (WELLBUTRIN XL) 150 MG 24 hr tablet; Take 1 tablet (150 mg total) by mouth daily.  Dispense: 90 tablet; Refill: 0  6. Obesity BMI today is 40.6  Course: Kyani is currently in the action stage of change. As such, her goal is to continue with weight loss efforts.   Nutrition goals: She has agreed to practicing portion control and making smarter food choices, such as increasing vegetables and decreasing simple carbohydrates.   Exercise goals:  As is.  Behavioral modification strategies: increasing lean protein intake, decreasing simple carbohydrates, and increasing vegetables.  [name] has agreed to follow-up with our clinic in 4 weeks. She was informed of the importance of frequent follow-up visits to maximize her success with intensive lifestyle modifications for her multiple health conditions.   Objective:   Blood pressure 121/80, pulse 78, temperature 98 F (36.7 C), temperature source Oral, height '5\' 7"'$  (1.702 m), weight 259 lb (117.5 kg), SpO2 99 %, unknown if currently breastfeeding. Body mass index is 40.57 kg/m.  General: Cooperative, alert, well developed, in no acute distress. HEENT: Conjunctivae and lids unremarkable. Cardiovascular: Regular rhythm.  Lungs: Normal work of breathing. Neurologic: No focal deficits.   Lab Results  Component Value Date   CREATININE 1.03 (H) 11/25/2021   BUN 12 11/25/2021   NA 141 11/25/2021   K 3.6 11/25/2021   CL 96 11/25/2021   CO2 29 11/25/2021   Lab Results  Component Value Date   ALT 12 11/25/2021   AST 15 11/25/2021   ALKPHOS 67 11/25/2021   BILITOT 0.5 11/25/2021   Lab Results  Component Value Date   HGBA1C 7.7 (H) 05/02/2021   HGBA1C 6.1 (H) 10/29/2020   HGBA1C 7.2 (H) 08/27/2015   Lab Results  Component Value Date   INSULIN 17.8 05/02/2021   Lab Results  Component Value Date   TSH 3.530  05/02/2021   Lab Results  Component Value Date   CHOL 229 (H) 05/02/2021   HDL 65 05/02/2021   LDLCALC 145 (H) 05/02/2021   TRIG 108 05/02/2021   Lab Results  Component Value Date   VD25OH 44.5 05/02/2021   Lab Results  Component Value Date   WBC 8.0 11/25/2021   HGB 14.2 11/25/2021   HCT 42.7 11/25/2021   MCV 87 11/25/2021   PLT 463 (H) 11/25/2021   Lab Results  Component Value Date   IRON 68 05/02/2021   TIBC 243 (L) 05/02/2021   FERRITIN 91 05/02/2021   Attestation Statements:   Reviewed by clinician on day of visit: allergies, medications, problem list, medical history, surgical history, family history, social history, and previous encounter notes.  I, Water quality scientist, CMA, am acting as transcriptionist for Briscoe Deutscher, DO  I have reviewed the above documentation for accuracy and completeness, and I agree with the above. -  Briscoe Deutscher, DO, MS, FAAFP, DABOM - Family and Bariatric Medicine.

## 2022-01-08 ENCOUNTER — Ambulatory Visit
Admission: RE | Admit: 2022-01-08 | Discharge: 2022-01-08 | Disposition: A | Discharge status: Home / Self Care | Payer: BC Managed Care – PPO | Source: Ambulatory Visit | Attending: Family Medicine | Admitting: Family Medicine

## 2022-01-08 DIAGNOSIS — R102 Pelvic and perineal pain unspecified side: Secondary | ICD-10-CM

## 2022-01-09 ENCOUNTER — Encounter (INDEPENDENT_AMBULATORY_CARE_PROVIDER_SITE_OTHER): Payer: Self-pay | Admitting: Family Medicine

## 2022-01-09 NOTE — Telephone Encounter (Signed)
Please advise 

## 2022-01-13 ENCOUNTER — Encounter (INDEPENDENT_AMBULATORY_CARE_PROVIDER_SITE_OTHER): Payer: Self-pay | Admitting: Family Medicine

## 2022-01-14 ENCOUNTER — Encounter (INDEPENDENT_AMBULATORY_CARE_PROVIDER_SITE_OTHER): Payer: Self-pay | Admitting: Family Medicine

## 2022-01-14 DIAGNOSIS — L732 Hidradenitis suppurativa: Secondary | ICD-10-CM | POA: Insufficient documentation

## 2022-01-14 HISTORY — DX: Hidradenitis suppurativa: L73.2

## 2022-01-15 NOTE — Telephone Encounter (Signed)
Dr.Wallace °

## 2022-01-17 ENCOUNTER — Other Ambulatory Visit (INDEPENDENT_AMBULATORY_CARE_PROVIDER_SITE_OTHER): Payer: Self-pay | Admitting: Family Medicine

## 2022-01-17 DIAGNOSIS — I10 Essential (primary) hypertension: Secondary | ICD-10-CM

## 2022-01-18 ENCOUNTER — Encounter (INDEPENDENT_AMBULATORY_CARE_PROVIDER_SITE_OTHER): Payer: Self-pay | Admitting: Family Medicine

## 2022-01-20 NOTE — Telephone Encounter (Signed)
Dr.Wallace °

## 2022-01-26 ENCOUNTER — Encounter (INDEPENDENT_AMBULATORY_CARE_PROVIDER_SITE_OTHER): Payer: Self-pay | Admitting: Family Medicine

## 2022-01-27 ENCOUNTER — Ambulatory Visit (INDEPENDENT_AMBULATORY_CARE_PROVIDER_SITE_OTHER): Payer: BC Managed Care – PPO | Admitting: Family Medicine

## 2022-01-30 ENCOUNTER — Encounter (INDEPENDENT_AMBULATORY_CARE_PROVIDER_SITE_OTHER): Payer: Self-pay | Admitting: Family Medicine

## 2022-01-30 ENCOUNTER — Telehealth (INDEPENDENT_AMBULATORY_CARE_PROVIDER_SITE_OTHER): Payer: BC Managed Care – PPO | Admitting: Family Medicine

## 2022-01-30 DIAGNOSIS — E1169 Type 2 diabetes mellitus with other specified complication: Secondary | ICD-10-CM

## 2022-01-30 DIAGNOSIS — Z6841 Body Mass Index (BMI) 40.0 and over, adult: Secondary | ICD-10-CM

## 2022-01-30 DIAGNOSIS — F419 Anxiety disorder, unspecified: Secondary | ICD-10-CM | POA: Diagnosis not present

## 2022-01-30 DIAGNOSIS — F32A Depression, unspecified: Secondary | ICD-10-CM

## 2022-01-30 DIAGNOSIS — K5903 Drug induced constipation: Secondary | ICD-10-CM | POA: Diagnosis not present

## 2022-01-30 DIAGNOSIS — E669 Obesity, unspecified: Secondary | ICD-10-CM

## 2022-01-30 DIAGNOSIS — Z7985 Long-term (current) use of injectable non-insulin antidiabetic drugs: Secondary | ICD-10-CM

## 2022-01-30 MED ORDER — TIRZEPATIDE 15 MG/0.5ML ~~LOC~~ SOAJ: 15.0000 mg | SUBCUTANEOUS | 2 refills | Status: DC

## 2022-01-30 MED ORDER — BUPROPION HCL ER (XL) 150 MG PO TB24: 150.0000 mg | ORAL_TABLET | Freq: Every day | ORAL | 0 refills | Status: DC

## 2022-01-30 MED ORDER — LINACLOTIDE 145 MCG PO CAPS: 145.0000 ug | ORAL_CAPSULE | Freq: Every day | ORAL | 2 refills | Status: DC

## 2022-01-30 NOTE — Progress Notes (Signed)
TeleHealth Visit:  Due to the COVID-19 pandemic, this visit was completed with telemedicine (audio/video) technology to reduce patient and provider exposure as well as to preserve personal protective equipment.   Mindy Reed has verbally consented to this TeleHealth visit. The patient is located at home, the provider is located at the [facility] office. The participants in this visit include the listed provider and patient. The visit was conducted today via MyChart video.  OBESITY Mindy Reed is here to discuss her progress with her obesity treatment plan along with follow-up of her obesity related diagnoses.   Today's visit was #: 9 Starting weight: 345 lbs Starting date: 05/02/2021 Pt Reported Weight: 255 (Down 4 lbs)  Interim History: Visit this morning for worsening of chronic back pain. Right knee feeling okay after gel shots, left knee starting to bother her. Mammogram was clear. GYN concerned about thinkened endometrial lining - [name] is now s/p progesterone x 12 days. No menses yet. Bloated today - did not take Linzess yesterday due to testing.Now taking 1/2 tab diuretic - vertigo symptoms gone. Sleep is okay right now. Noticed feeling less strong - getting back to protein shakes for supplementation. Taking 1/2 tab Vyvanse.   Highest weight 500 lbs, s/p gastric bypass, starting weight at [place]: practicing portion control and making smarter food choices, such as increasing vegetables and decreasing simple carbohydrates (1500 calories).  Anti-obesity medications: Mounjaro 15 mg weekly. Reported side effects: None. Activity: None due to back pain.  Assessment/Plan:   1. Type 2 diabetes mellitus with other specified complication, without long-term current use of insulin (HCC) Diabetes Mellitus: Improving, but not optimized. Medication: Mounjaro 25 mg [place] weekly. Issues reviewed: blood sugar goals, complications of diabetes mellitus, hypoglycemia prevention and  treatment, exercise, and nutrition.  Plan: We will recheck labs in 3 months if not done with Endocrinology or PCP.  The patient will continue to focus on protein-rich, low simple carbohydrate foods. We reviewed the importance of hydration, regular exercise for stress reduction, and restorative sleep.   Lab Results  Component Value Date   HGBA1C 7.7 (H) 05/02/2021   HGBA1C 6.1 (H) 10/29/2020   HGBA1C 7.2 (H) 08/27/2015   Lab Results  Component Value Date   LDLCALC 145 (H) 05/02/2021   CREATININE 1.03 (H) 11/25/2021   - tirzepatide (MOUNJARO) 15 MG/0.5ML Pen; Inject 15 mg into the skin once a week.  Dispense: 6 mL; Refill: 2  2. Drug-induced constipation The current medical regimen is effective;  continue present plan and medications.  - linaclotide (LINZESS) 145 MCG CAPS capsule; Take 1 capsule (145 mcg total) by mouth daily before breakfast.  Dispense: 30 capsule; Refill: 2  3. Emotional eating tendencies Controlled. Medication: Wellbutrin, Mounjaro.   Plan: Motivational interviewing as well as evidence-based interventions for health behavior change were utilized today including the discussion of self monitoring techniques, problem-solving barriers and SMART goal setting techniques.   - buPROPion (WELLBUTRIN XL) 150 MG 24 hr tablet; Take 1 tablet (150 mg total) by mouth daily.  Dispense: 90 tablet; Refill: 0  4. Obesity, current BMI 40.5  [name] is currently in the action stage of change. As such, her goal is to continue with weight loss efforts. She has agreed to practicing portion control and making smarter food choices, such as increasing vegetables and decreasing simple carbohydrates.   Exercise goals:  As is.  Behavioral modification strategies: increasing lean protein intake, decreasing simple carbohydrates, increasing vegetables, and increasing water intake.  [name] has  agreed to follow-up with our clinic in 4 weeks. She was informed of the importance of frequent follow-up  visits to maximize her success with intensive lifestyle modifications for her multiple health conditions.  Objective:   VITALS: Per patient if applicable, see vitals. GENERAL: Alert and in no acute distress. CARDIOPULMONARY: No increased WOB. Speaking in clear sentences.  PSYCH: Pleasant and cooperative. Speech normal rate and rhythm. Affect is appropriate. Insight and judgement are appropriate. Attention is focused, linear, and appropriate.  NEURO: Oriented as arrived to appointment on time with no prompting.   Lab Results  Component Value Date   CREATININE 1.03 (H) 11/25/2021   BUN 12 11/25/2021   NA 141 11/25/2021   K 3.6 11/25/2021   CL 96 11/25/2021   CO2 29 11/25/2021   Lab Results  Component Value Date   ALT 12 11/25/2021   AST 15 11/25/2021   ALKPHOS 67 11/25/2021   BILITOT 0.5 11/25/2021   Lab Results  Component Value Date   HGBA1C 7.7 (H) 05/02/2021   HGBA1C 6.1 (H) 10/29/2020   HGBA1C 7.2 (H) 08/27/2015   Lab Results  Component Value Date   INSULIN 17.8 05/02/2021   Lab Results  Component Value Date   TSH 3.530 05/02/2021   Lab Results  Component Value Date   CHOL 229 (H) 05/02/2021   HDL 65 05/02/2021   LDLCALC 145 (H) 05/02/2021   TRIG 108 05/02/2021   Lab Results  Component Value Date   WBC 8.0 11/25/2021   HGB 14.2 11/25/2021   HCT 42.7 11/25/2021   MCV 87 11/25/2021   PLT 463 (H) 11/25/2021   Lab Results  Component Value Date   IRON 68 05/02/2021   TIBC 243 (L) 05/02/2021   FERRITIN 91 05/02/2021   Attestation Statements:   Reviewed by clinician on day of visit: allergies, medications, problem list, medical history, surgical history, family history, social history, and previous encounter notes.

## 2022-05-28 ENCOUNTER — Encounter (INDEPENDENT_AMBULATORY_CARE_PROVIDER_SITE_OTHER): Payer: Self-pay

## 2022-06-04 ENCOUNTER — Other Ambulatory Visit: Payer: Self-pay | Admitting: Family Medicine

## 2022-06-04 DIAGNOSIS — R1031 Right lower quadrant pain: Secondary | ICD-10-CM

## 2022-07-01 ENCOUNTER — Other Ambulatory Visit: Payer: BC Managed Care – PPO

## 2022-07-21 ENCOUNTER — Inpatient Hospital Stay: Admission: RE | Admit: 2022-07-21 | Payer: BC Managed Care – PPO | Source: Ambulatory Visit

## 2022-09-03 ENCOUNTER — Other Ambulatory Visit (HOSPITAL_COMMUNITY): Payer: Self-pay

## 2022-09-03 MED ORDER — OZEMPIC (2 MG/DOSE) 8 MG/3ML ~~LOC~~ SOPN
2.0000 mg | PEN_INJECTOR | SUBCUTANEOUS | 0 refills | Status: DC
  Filled 2022-09-03: qty 3, 28d supply, fill #0

## 2022-09-08 ENCOUNTER — Other Ambulatory Visit (HOSPITAL_COMMUNITY): Payer: Self-pay

## 2022-09-08 MED ORDER — AMPHETAMINE-DEXTROAMPHET ER 20 MG PO CP24
20.0000 mg | ORAL_CAPSULE | Freq: Every morning | ORAL | 0 refills | Status: DC
  Filled 2022-09-08: qty 30, 30d supply, fill #0

## 2022-09-09 ENCOUNTER — Other Ambulatory Visit (HOSPITAL_COMMUNITY): Payer: Self-pay

## 2022-09-29 ENCOUNTER — Other Ambulatory Visit (HOSPITAL_COMMUNITY): Payer: Self-pay

## 2022-09-29 MED ORDER — ZEPBOUND 7.5 MG/0.5ML ~~LOC~~ SOAJ
7.5000 mg | SUBCUTANEOUS | 1 refills | Status: DC
  Filled 2022-09-29 – 2022-10-02 (×2): qty 2, 28d supply, fill #0

## 2022-09-30 ENCOUNTER — Other Ambulatory Visit (HOSPITAL_COMMUNITY): Payer: Self-pay

## 2022-10-02 ENCOUNTER — Other Ambulatory Visit (HOSPITAL_COMMUNITY): Payer: Self-pay

## 2022-10-16 ENCOUNTER — Other Ambulatory Visit (HOSPITAL_COMMUNITY): Payer: Self-pay

## 2022-10-16 MED ORDER — MELOXICAM 15 MG PO TABS
15.0000 mg | ORAL_TABLET | Freq: Every day | ORAL | 0 refills | Status: DC | PRN
  Filled 2022-10-16 – 2023-03-02 (×4): qty 90, 90d supply, fill #0

## 2022-10-16 MED ORDER — LISDEXAMFETAMINE DIMESYLATE 40 MG PO CAPS
40.0000 mg | ORAL_CAPSULE | Freq: Every morning | ORAL | 0 refills | Status: DC
  Filled 2022-10-16: qty 30, 30d supply, fill #0

## 2022-10-21 ENCOUNTER — Other Ambulatory Visit (HOSPITAL_COMMUNITY): Payer: Self-pay

## 2022-10-21 MED ORDER — MOUNJARO 7.5 MG/0.5ML ~~LOC~~ SOAJ
7.5000 mg | SUBCUTANEOUS | 0 refills | Status: DC
  Filled 2022-10-21: qty 2, 28d supply, fill #0

## 2022-10-22 ENCOUNTER — Other Ambulatory Visit (HOSPITAL_COMMUNITY): Payer: Self-pay

## 2022-11-11 IMAGING — US US PELVIS COMPLETE WITH TRANSVAGINAL
2 series · 13 of 25 positions shown · non-contrast
Comparison: Prior ultrasound from 12/26/2020.

CLINICAL DATA: Initial evaluation for right-sided pelvic pain,
history of ovarian cyst.

EXAM:
TRANSABDOMINAL AND TRANSVAGINAL ULTRASOUND OF PELVIS
TECHNIQUE: Both transabdominal and transvaginal ultrasound examinations of the
pelvis were performed. Transabdominal technique was performed for
global imaging of the pelvis including uterus, ovaries, adnexal
regions, and pelvic cul-de-sac. It was necessary to proceed with
endovaginal exam following the transabdominal exam to visualize the
endometrium and ovaries.

[Series 1: us pelvis complete with transvaginal · 0.23mm/px · 71 acquisitions, 12 frames shown (1 of 2)]
[im 1/71]
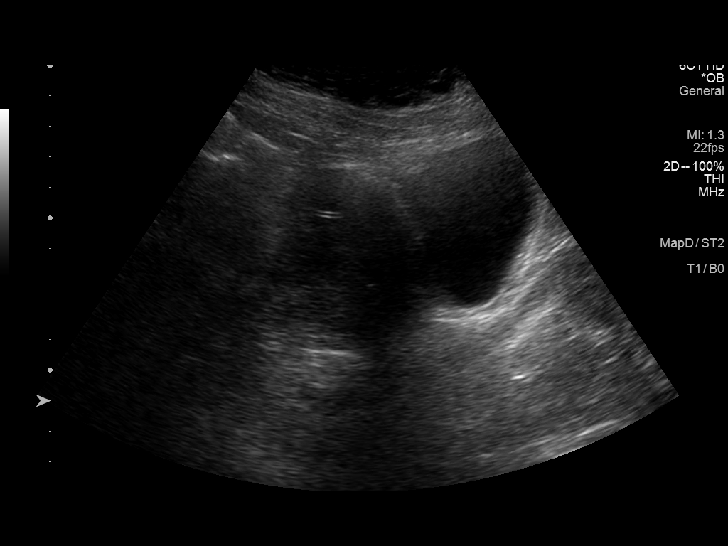
[im 7/71]
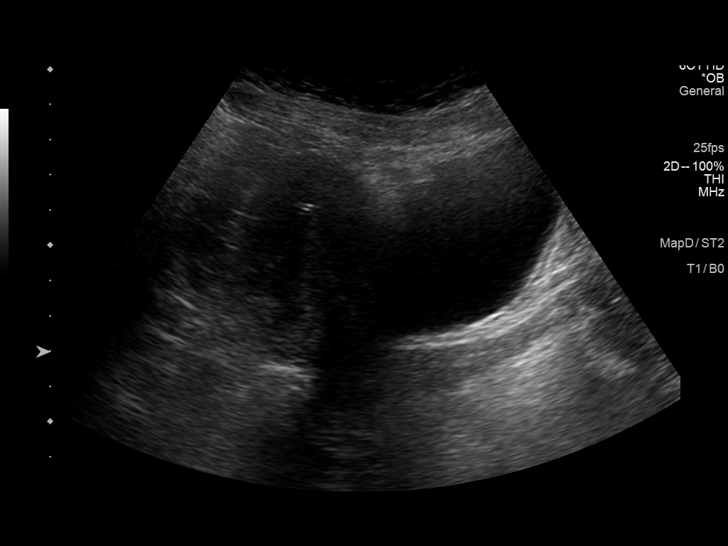
[im 13/71]
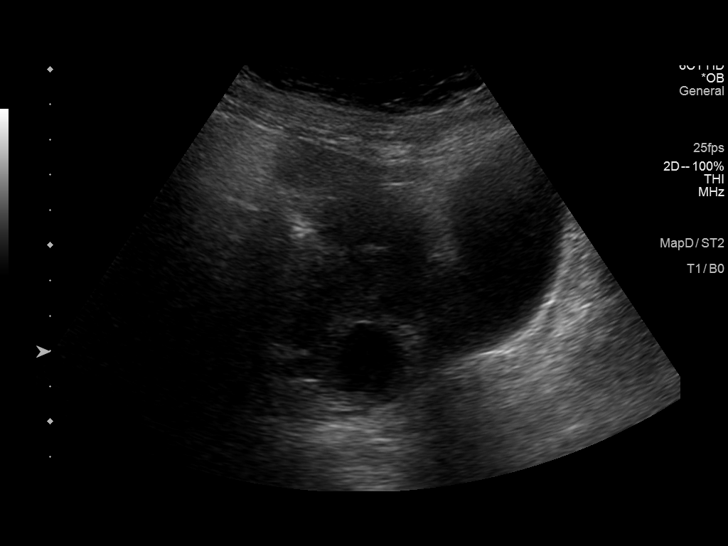
[im 19/71]
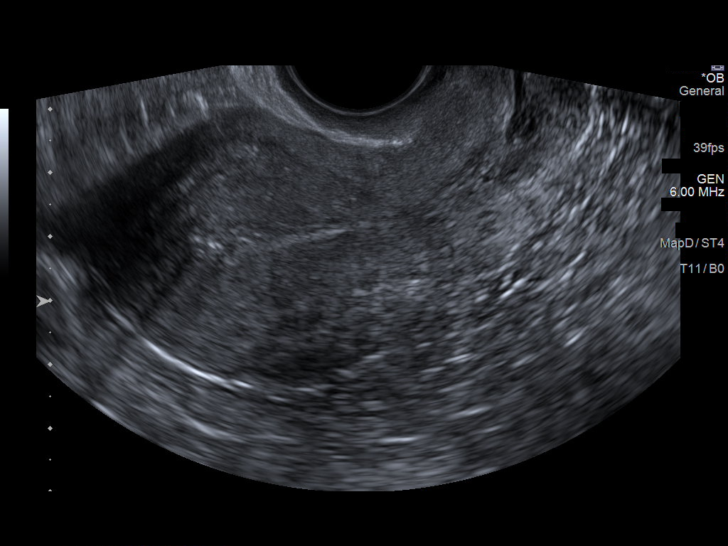
[im 25/71]
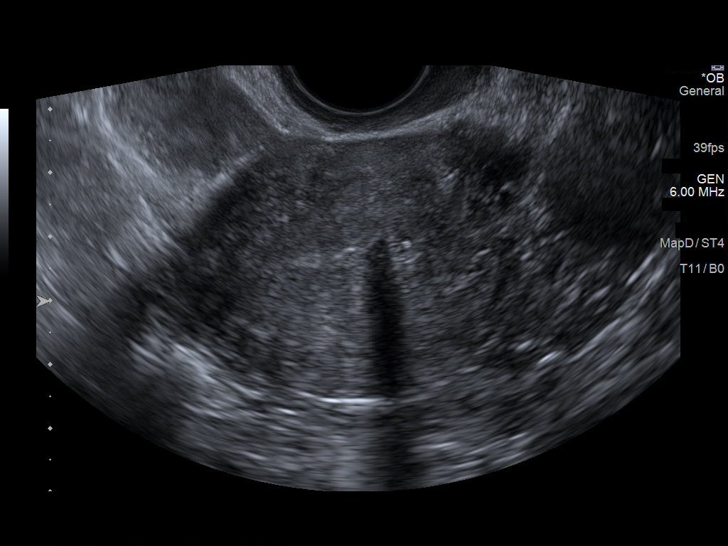
[im 31/71]
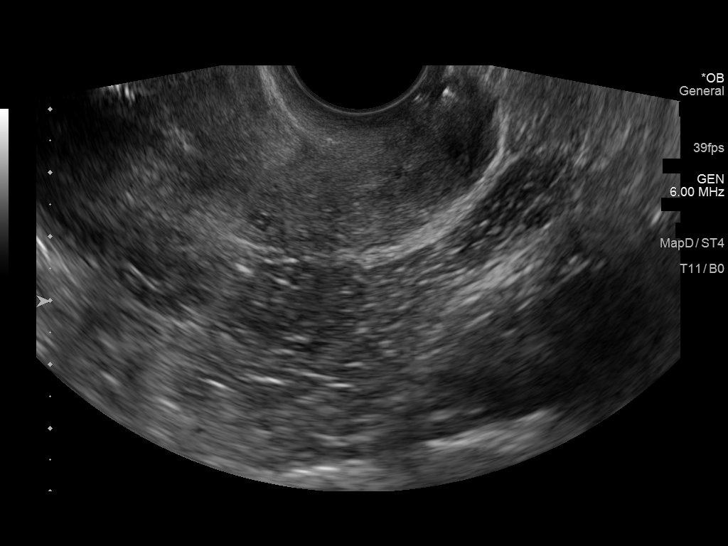
[im 37/71]
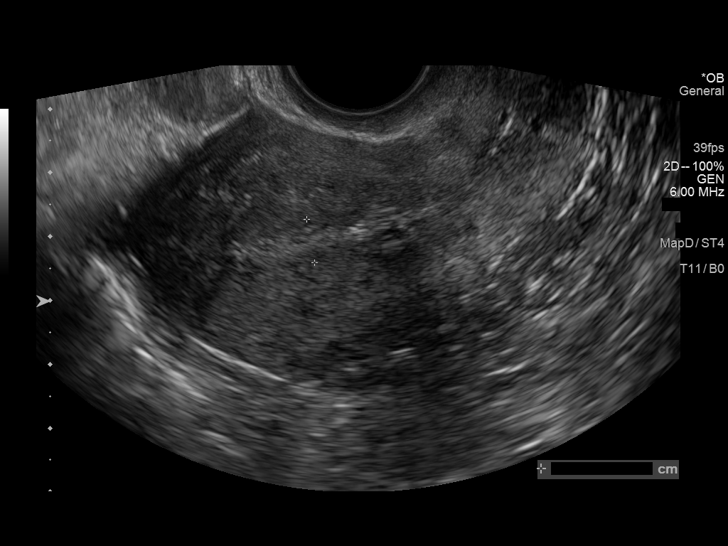
[im 43/71]
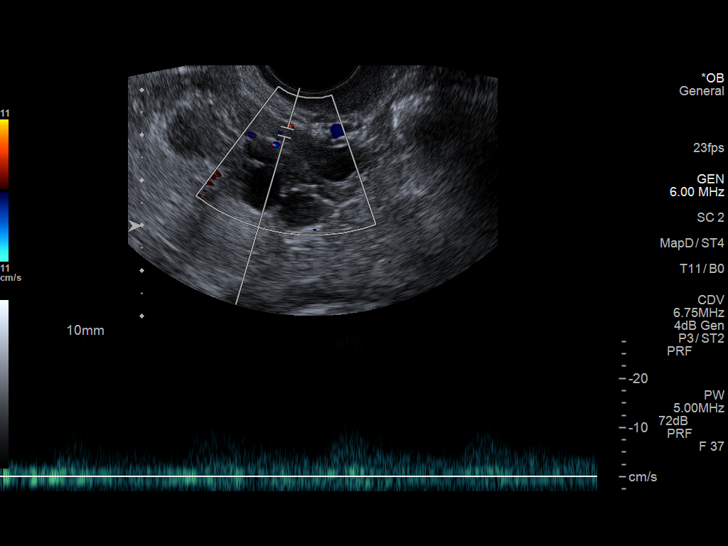
[im 49/71]
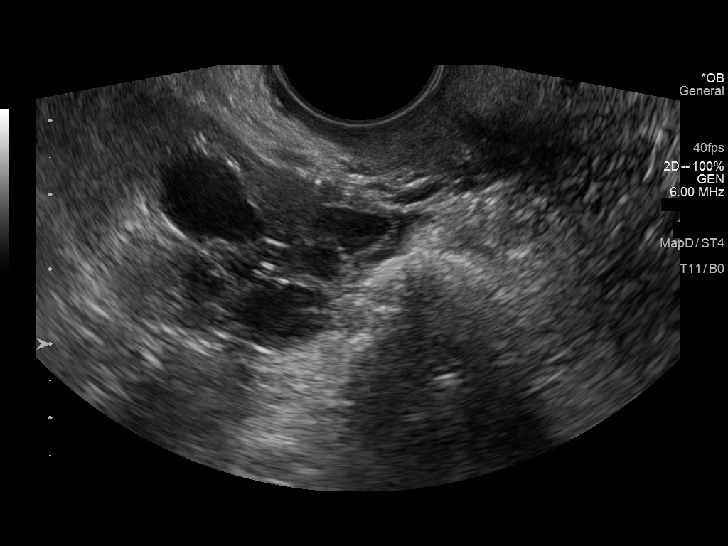
[im 55/71]
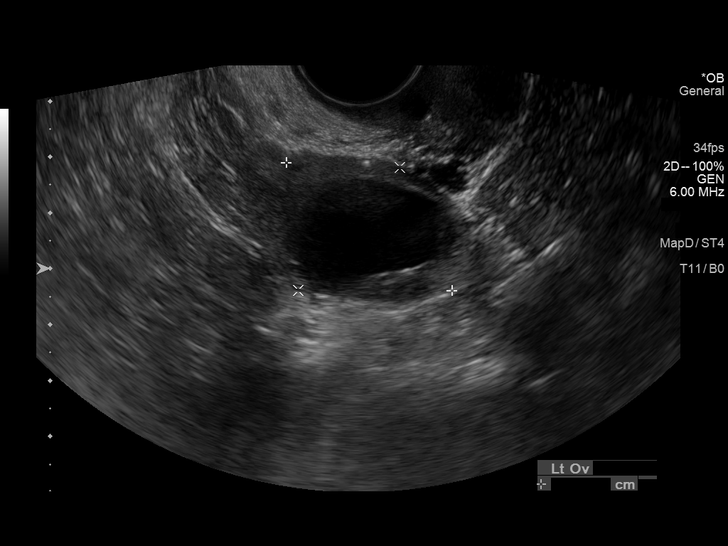
[im 61/71]
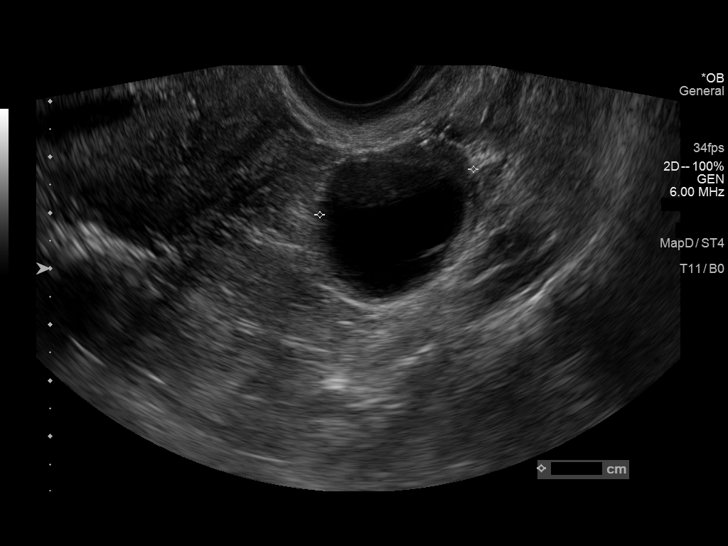
[im 67/71]
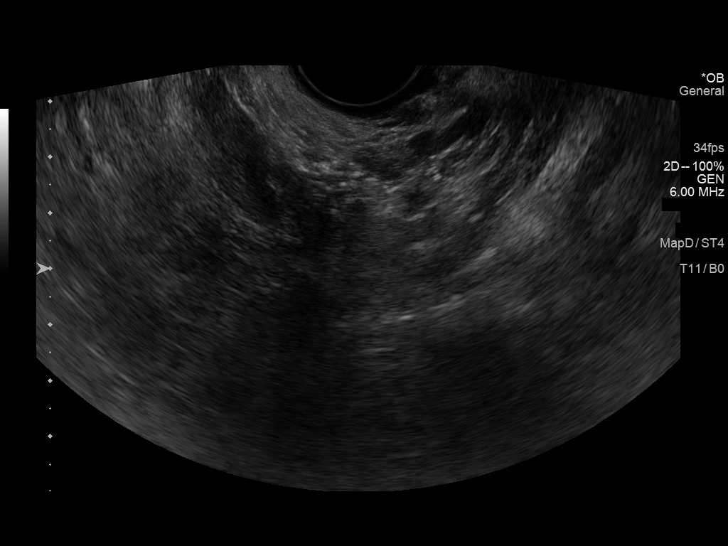

[Series 1001: us pelvis complete with transvaginal · 0.20mm/px · 1 of 3 slices shown (2 of 2)]
[im 1/3]
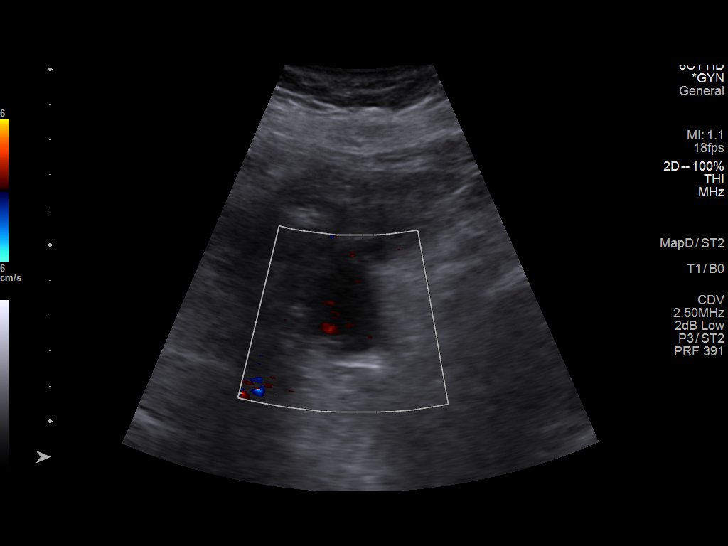

[13 of 25 positions shown; findings below may reference images not displayed]

FINDINGS: Uterus

Measurements: 6.9 x 4.4 x 4.7 cm = volume: 74.6 mL. Uterus is
anteverted. No discrete fibroid or other myometrial abnormality.

Endometrium

Thickness: 7 mm. No focal abnormality visualized. IUD in appropriate
position within the endometrial cavity.

Right ovary

Measurements: 2.8 x 2.3 x 1.8 cm = volume: 6.1 mL. Multiple small
follicles measuring up to 1.4 cm are seen. No other adnexal mass.
Normal Doppler arterial and venous waveform seen within the right
ovary.

Left ovary

Measurements: 3.7 x 2.9 x 2.9 cm = volume: 16.3 mL. 2.8 cm dominant
follicle with internal daughter cyst noted. No other adnexal mass.
Normal Doppler arterial and venous waveforms seen within the left
ovary.

Other findings

Trace free fluid seen within the pelvis.
IMPRESSION: 1. Bilateral ovarian follicles measuring up to 2.8 cm on the left
and 1.4 cm on the right. Associated trace free fluid within the
pelvis. No concerning adnexal mass identified.
2. Normal sonographic appearance of the uterus and endometrium.
3. IUD in appropriate position within the endometrial cavity.

## 2022-11-17 ENCOUNTER — Other Ambulatory Visit (HOSPITAL_COMMUNITY): Payer: Self-pay

## 2022-11-17 MED ORDER — LISDEXAMFETAMINE DIMESYLATE 40 MG PO CAPS
ORAL_CAPSULE | ORAL | 0 refills | Status: DC
  Filled 2022-11-17: qty 30, 30d supply, fill #0

## 2022-11-17 MED ORDER — TRAZODONE HCL 100 MG PO TABS
100.0000 mg | ORAL_TABLET | Freq: Every evening | ORAL | 3 refills | Status: DC | PRN
  Filled 2022-11-17: qty 90, 90d supply, fill #0

## 2022-11-17 MED ORDER — MOUNJARO 7.5 MG/0.5ML ~~LOC~~ SOAJ
7.5000 mg | SUBCUTANEOUS | 0 refills | Status: DC
  Filled 2022-11-17: qty 2, 28d supply, fill #0

## 2022-12-04 ENCOUNTER — Other Ambulatory Visit (HOSPITAL_COMMUNITY): Payer: Self-pay

## 2022-12-04 MED ORDER — MOUNJARO 10 MG/0.5ML ~~LOC~~ SOAJ
10.0000 mg | SUBCUTANEOUS | 2 refills | Status: DC
  Filled 2022-12-04: qty 2, 28d supply, fill #0
  Filled 2022-12-28: qty 2, 28d supply, fill #1

## 2022-12-04 MED ORDER — DOXYCYCLINE MONOHYDRATE 100 MG PO CAPS
100.0000 mg | ORAL_CAPSULE | Freq: Two times a day (BID) | ORAL | 0 refills | Status: DC
  Filled 2022-12-04: qty 20, 10d supply, fill #0

## 2022-12-06 ENCOUNTER — Other Ambulatory Visit (HOSPITAL_COMMUNITY): Payer: Self-pay

## 2022-12-06 MED ORDER — LISDEXAMFETAMINE DIMESYLATE 40 MG PO CAPS
40.0000 mg | ORAL_CAPSULE | Freq: Every morning | ORAL | 0 refills | Status: DC
  Filled 2023-02-02: qty 30, 30d supply, fill #0

## 2022-12-08 ENCOUNTER — Other Ambulatory Visit (HOSPITAL_COMMUNITY): Payer: Self-pay

## 2023-01-01 ENCOUNTER — Other Ambulatory Visit (HOSPITAL_COMMUNITY): Payer: Self-pay

## 2023-01-01 MED ORDER — BUPROPION HCL ER (XL) 150 MG PO TB24
150.0000 mg | ORAL_TABLET | Freq: Every day | ORAL | 0 refills | Status: DC
  Filled 2023-01-01: qty 90, 90d supply, fill #0

## 2023-01-02 ENCOUNTER — Encounter (HOSPITAL_COMMUNITY): Payer: Self-pay

## 2023-01-02 ENCOUNTER — Emergency Department (HOSPITAL_COMMUNITY)
Admission: EM | Admit: 2023-01-02 | Discharge: 2023-01-03 | Disposition: A | Discharge status: Home / Self Care | Payer: BC Managed Care – PPO | Attending: Emergency Medicine | Admitting: Emergency Medicine

## 2023-01-02 ENCOUNTER — Emergency Department (HOSPITAL_COMMUNITY): Payer: BC Managed Care – PPO

## 2023-01-02 ENCOUNTER — Other Ambulatory Visit (HOSPITAL_COMMUNITY): Payer: Self-pay

## 2023-01-02 DIAGNOSIS — R748 Abnormal levels of other serum enzymes: Secondary | ICD-10-CM | POA: Insufficient documentation

## 2023-01-02 DIAGNOSIS — Z79899 Other long term (current) drug therapy: Secondary | ICD-10-CM | POA: Diagnosis not present

## 2023-01-02 DIAGNOSIS — G723 Periodic paralysis: Secondary | ICD-10-CM | POA: Diagnosis not present

## 2023-01-02 DIAGNOSIS — I10 Essential (primary) hypertension: Secondary | ICD-10-CM | POA: Insufficient documentation

## 2023-01-02 DIAGNOSIS — E119 Type 2 diabetes mellitus without complications: Secondary | ICD-10-CM | POA: Diagnosis not present

## 2023-01-02 DIAGNOSIS — R4182 Altered mental status, unspecified: Secondary | ICD-10-CM | POA: Diagnosis present

## 2023-01-02 LAB — CBC
HCT: 35.6 % — ABNORMAL LOW (ref 36.0–46.0)
Hemoglobin: 12.7 g/dL (ref 12.0–15.0)
MCH: 30.8 pg (ref 26.0–34.0)
MCHC: 35.7 g/dL (ref 30.0–36.0)
MCV: 86.2 fL (ref 80.0–100.0)
Platelets: 340 10*3/uL (ref 150–400)
RBC: 4.13 MIL/uL (ref 3.87–5.11)
RDW: 12.7 % (ref 11.5–15.5)
WBC: 7.8 10*3/uL (ref 4.0–10.5)
nRBC: 0 % (ref 0.0–0.2)

## 2023-01-02 LAB — I-STAT BETA HCG BLOOD, ED (MC, WL, AP ONLY): I-stat hCG, quantitative: <5 m[IU]/mL (ref <5)

## 2023-01-02 LAB — CBG MONITORING, ED: Glucose-Capillary: 93 mg/dL (ref 70–99)

## 2023-01-02 LAB — ETHANOL: Alcohol, Ethyl (B): <10 mg/dL (ref <10)

## 2023-01-02 MED ORDER — MOUNJARO 10 MG/0.5ML ~~LOC~~ SOAJ
10.0000 mg | SUBCUTANEOUS | 2 refills | Status: DC
  Filled 2023-01-02: qty 2, 28d supply, fill #0
  Filled 2023-03-02: qty 2, 28d supply, fill #1
  Filled 2023-04-03: qty 2, 28d supply, fill #2

## 2023-01-02 MED ORDER — TOPIRAMATE ER 50 MG PO CAP24
50.0000 mg | ORAL_CAPSULE | Freq: Every day | ORAL | 0 refills | Status: DC
  Filled 2023-01-02: qty 90, 90d supply, fill #0

## 2023-01-02 MED ORDER — SODIUM CHLORIDE 0.9 % IV BOLUS
1000.0000 mL | Freq: Once | INTRAVENOUS | Status: AC
  Administered 2023-01-02: 1000 mL via INTRAVENOUS

## 2023-01-02 MED ORDER — LINZESS 290 MCG PO CAPS
290.0000 ug | ORAL_CAPSULE | Freq: Every day | ORAL | 0 refills | Status: DC
  Filled 2023-01-02: qty 90, 90d supply, fill #0

## 2023-01-02 MED ORDER — LISDEXAMFETAMINE DIMESYLATE 40 MG PO CAPS
40.0000 mg | ORAL_CAPSULE | Freq: Every morning | ORAL | 0 refills | Status: DC
  Filled 2023-01-02: qty 30, 30d supply, fill #0

## 2023-01-02 MED ORDER — DULOXETINE HCL 60 MG PO CPEP
60.0000 mg | ORAL_CAPSULE | Freq: Every day | ORAL | 0 refills | Status: DC
  Filled 2023-01-02: qty 90, 90d supply, fill #0

## 2023-01-02 MED ORDER — DIAZEPAM 5 MG/ML IJ SOLN
2.5000 mg | Freq: Once | INTRAMUSCULAR | Status: AC
  Administered 2023-01-02: 2.5 mg via INTRAVENOUS
  Filled 2023-01-02: qty 2

## 2023-01-02 MED ORDER — SPIRONOLACTONE 50 MG PO TABS
50.0000 mg | ORAL_TABLET | Freq: Every day | ORAL | 0 refills | Status: DC
  Filled 2023-01-02: qty 30, 30d supply, fill #0

## 2023-01-02 NOTE — ED Provider Triage Note (Signed)
Emergency Medicine Provider Triage Evaluation Note  Mindy Reed , a 42 y.o. female with a history of anxiety, diabetes, and migraines was evaluated in triage.  Pt complains of abdominal pain and decreased responsiveness.  EMS was called to the patient's house for chest pain.  When they arrived patient was beating on her chest stating that she was having chest discomfort.  She started refusing to answer questions and has not been responding to providers since transferred to the emergency department.  Upon my evaluation points to her abdomen when I ask her where she is hurting and nods yes indicating that it is painful there.  Noted to have shaking of her bilateral lower extremities that her mother says has been common since an old injury.  Physical Exam  BP (!) 125/58 (BP Location: Right Arm)   Pulse 93   Temp (!) 97.5 F (36.4 C) (Oral)   Resp 17   Ht 5\' 7"  (1.702 m)   Wt 120 kg   SpO2 99%   BMI 41.43 kg/m  Gen:   Awake, no distress, not responding to questions aside from nodding yes and no Resp:  Normal effort  MSK:   Moves extremities without difficulty  Other:  Abdomen soft nontender nondistended Neuro:  Moves all 4 extremities equally and following commands  Medical Decision Making  Medically screening exam initiated at 10:12 PM.  Appropriate orders placed.  Mindy Reed was informed that the remainder of the evaluation will be completed by another provider, this initial triage assessment does not replace that evaluation, and the importance of remaining in the ED until their evaluation is complete.  Will place CT head as well as toxicology and labs for patient's altered mental status.  Does not appear to be having any seizure-like activity at this time.  Suspect that there may be a psychiatric or behavioral component to her not responding verbally to questions.  Will also obtain EKG and troponins with her reported chest discomfort as well as lipase and CMP with her abdominal  discomfort.   Fransico Meadow, MD 01/02/23 2215

## 2023-01-02 NOTE — ED Notes (Signed)
Dr. Sharlett Iles at the bedside for [facility]

## 2023-01-02 NOTE — ED Provider Notes (Signed)
[facility] Provider Note  CSN: [id number] Arrival date & time: 01/02/23 2153  Chief Complaint(s) Altered Mental Status  HPI Mindy Reed is a 42 y.o. female with a past medical history listed below including anxiety, depression, diabetes who presents to the emergency department for episode of altered mental status.  Patient was home in her room by herself.  Family including her 7-year-old son and her mother were in other rooms.  Family reports that they heard her scream and came and saw that she was not speaking.  Patient was reportedly sleeping at that time.   Patient is currently accompanied by her husband who is providing some of the information.  During the interview process, patient was slow to respond only nodding yes or no to certain questions however she would speak to correct her husband.  When asked if she was sleeping, patient answered yes.  She stated that it felt like she had a "night tear."  She denied any associated headache, chest pain, shortness of breath during the episode.  She denies any recent fevers or infections.  No physical complaints currently.   Altered Mental Status   Past Medical History Past Medical History:  Diagnosis Date   Abnormally small mouth    ADHD    Anemia    Anxiety    Arthritis    right ankle   Back pain    Complication of anesthesia    was hard to wake up after gastric bypass; states "fought them" during induction for D & C   Dental crowns present    Depression    Diabetes mellitus without complication (HCC)    diet controlled dm   Eczema    GERD (gastroesophageal reflux disease)    no meds currently   GERD (gastroesophageal reflux disease)    Hidradenitis suppurativa    Hypertension    off bp meds x 2 years due to gastric bypass   Infertility, female    Joint pain    Lower extremity edema    Mass of breast, 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   Obesity    PCOS (polycystic ovarian  syndrome)    Seasonal asthma    SOB (shortness of breath)    Patient Active Problem List   Diagnosis Date Noted   Attention deficit hyperactivity disorder, 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   Depressive disorder 05/02/2021   Hydrosalpinx 05/02/2021   Abnormal uterine bleeding (AUB)    History of bariatric surgery 10/02/2020   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 Authorizing Provider  potassium chloride (KLOR-CON) 10 MEQ tablet Take 1 tablet (10 mEq total) by mouth daily for 5 days. 01/03/23 01/08/23 Yes Jalonda Antigua, Grayce Sessions, MD  acetaminophen (TYLENOL) 500 MG tablet Take 1,000 mg by mouth every 6 (six) hours as needed for moderate pain or headache.    [provider]  albuterol (VENTOLIN HFA) 108 (90 Base) MCG/ACT inhaler Inhale into the lungs. 11/08/20   [provider]  amphetamine-dextroamphetamine (ADDERALL XR) 20 MG 24 hr capsule Take 1 capsule (20 mg total) by mouth in the morning. 09/08/22     ascorbic acid (VITAMIN C) 500 MG tablet Take  500 mg by mouth every evening.    [provider]  buPROPion (WELLBUTRIN XL) 150 MG 24 hr tablet Take 1 tablet (150 mg total) by mouth daily. 01/30/22   Briscoe Deutscher, DO  buPROPion (WELLBUTRIN XL) 150 MG 24 hr tablet Take 1 tablet (150 mg total) by mouth daily. 01/01/23     busPIRone (BUSPAR) 7.5 MG tablet Take 7.5 mg by mouth 2 (two) times daily.    [provider]  Cholecalciferol (VITAMIN D3) 25 MCG (1000 UT) CAPS Take by mouth.    [provider]  diphenhydrAMINE-zinc acetate (BENADRYL) cream Apply 1 application topically 3 (three) times daily as needed (eczema).    [provider]  doxycycline (MONODOX) 100 MG capsule Take 1 capsule (100 mg) by mouth 2 times daily for 10 days 12/04/22     DULoxetine (CYMBALTA) 60 MG capsule Take 60 mg by mouth daily.    [provider]  DULoxetine (CYMBALTA) 60 MG capsule Take 1 capsule (60 mg total) by mouth daily. 01/01/23     fexofenadine (ALLEGRA) 180 MG tablet Take 180 mg by mouth daily.    [provider]  fluticasone (FLONASE) 50 MCG/ACT nasal spray Place into both nostrils daily.    [provider]  L-Theanine 200 MG CAPS Take by mouth.    [provider]  linaclotide Rolan Lipa) 145 MCG CAPS capsule Take 1 capsule (145 mcg total) by mouth daily before breakfast. 01/30/22 04/30/22  Briscoe Deutscher, DO  linaclotide (LINZESS) 290 MCG CAPS capsule Take 1 capsule (290 mcg total) by mouth daily at least 30 minutes before the first meal of the day on an empty stomach 01/01/23     lisdexamfetamine (VYVANSE) 40 MG capsule Take 1 capsule (40 mg total) by mouth every morning. 12/06/22     lisdexamfetamine (VYVANSE) 40 MG capsule Take 1 capsule (40 mg total) by mouth every morning. 01/01/23     Lisdexamfetamine Dimesylate (VYVANSE) 60 MG CHEW Chew 60 mg by mouth daily at 12 noon. 11/25/21   Briscoe Deutscher, DO  Lisdexamfetamine Dimesylate (VYVANSE) 60 MG CHEW Chew 60 mg by mouth daily. 11/26/21   Briscoe Deutscher, DO  Lisdexamfetamine Dimesylate (VYVANSE) 60 MG CHEW Chew 60 mg by mouth daily. 11/26/21   Briscoe Deutscher, DO  LORazepam (ATIVAN) 0.5 MG tablet Take 0.25 mg by mouth daily as needed for anxiety.    [provider]  MAGNESIUM GLUCONATE PO Take 120 mg by mouth daily at 12 noon.    [provider]  meloxicam (MOBIC) 15 MG tablet Take 1 tablet (15 mg total) by mouth daily as needed for pain. 10/16/22     Misc Natural Products (GLUCOS-CHONDROIT-MSM COMPLEX PO) Take 1 tablet by mouth daily.    [provider]  Multiple Vitamins-Minerals (MULTIVITAMIN & MINERAL PO) Take 1 tablet by  mouth 2 (two) times daily.     [provider]  omeprazole (PRILOSEC) 20 MG capsule Take 20 mg by mouth daily as needed (heartburn). 07/24/17   [provider]  Probiotic Product (PROBIOTIC PO) Take by mouth.    [provider]  promethazine (PHENERGAN) 25 MG tablet Take 25 mg by mouth 2 (two) times daily as needed. 10/29/20   [provider]  Semaglutide, 2 MG/DOSE, (OZEMPIC, 2 MG/DOSE,) 8 MG/3ML SOPN Inject 2 mg into the skin once a week. 09/03/22     spironolactone (ALDACTONE) 50 MG tablet Take 1 tablet (50 mg total) by mouth daily. 01/01/23     tirzepatide (MOUNJARO) 10  MG/0.5ML Pen Inject 10 mg into the skin once a week. 12/04/22     tirzepatide (MOUNJARO) 10 MG/0.5ML Pen Inject 10 mg into the skin once a week. 01/01/23     tirzepatide (MOUNJARO) 15 MG/0.5ML Pen Inject 15 mg into the skin once a week. 01/30/22   Briscoe Deutscher, DO  tirzepatide [facility]) 7.5 MG/0.5ML Pen Inject 7.5 mg into the skin once a week. 11/17/22     Tirzepatide-Weight Management (ZEPBOUND) 7.5 MG/0.5ML SOAJ Take 7.5 mg by mouth once a week. 09/29/22     Topiramate ER (TROKENDI XR) 50 MG CP24 Take 1 capsule (50 mg total) by mouth daily. 01/01/23     traZODone (DESYREL) 100 MG tablet Take 1 tablet (100 mg total) by mouth at bedtime. 11/25/21   Briscoe Deutscher, DO  traZODone (DESYREL) 100 MG tablet Take 1 tablet (100 mg total) by mouth at bedtime as needed for sleep 11/17/22     vitamin E 180 MG (400 UNITS) capsule Take 400 Units by mouth daily.    [provider]                                                                                                                                    Allergies Morphine and related, Adhesive [tape], Aspirin, and Nsaids  Review of Systems Review of Systems As noted in HPI  Physical Exam Vital Signs  I have reviewed the triage vital signs BP 111/60   Pulse 65   Temp 97.7 F (36.5 C) (Oral)   Resp 17   Ht 5\' 7"  (1.702 m)   Wt 120 kg    LMP  (LMP Unknown)   SpO2 93%   BMI 41.43 kg/m   Physical Exam Vitals reviewed.  Constitutional:      General: She is not in acute distress.    Appearance: She is well-developed. She is not diaphoretic.  HENT:     Head: Normocephalic and atraumatic.     Right Ear: External ear normal.     Left Ear: External ear normal.     Nose: Nose normal.  Eyes:     General: No scleral icterus.    Conjunctiva/sclera: Conjunctivae normal.  Neck:     Trachea: Phonation normal.  Cardiovascular:     Rate and Rhythm: Normal rate and regular rhythm.  Pulmonary:     Effort: Pulmonary effort is normal. No respiratory distress.     Breath sounds: No stridor.  Abdominal:     General: There is no distension.  Musculoskeletal:        General: Normal range of motion.     Cervical back: Normal range of motion.  Neurological:     Mental Status: She is alert and oriented to person, place, and time.     Comments: Mental Status:  Alert and oriented to person, place, and time.  Attention and concentration normal.  Speech clear.  Recent memory is intact  Cranial Nerves:  II Visual Fields: Intact to confrontation. Visual fields intact. III, IV, VI: Pupils equal and reactive to light and near. Full eye movement without nystagmus  V Facial Sensation: Normal. No weakness of masticatory muscles  VII: No facial weakness or asymmetry  VIII Auditory Acuity: Grossly normal  IX/X: The uvula is midline; the palate elevates symmetrically  XI: Normal sternocleidomastoid and trapezius strength  XII: The tongue is midline. No atrophy or fasciculations.   Motor System: Muscle Strength: 5/5 and symmetric in the upper and lower extremities. No pronation or drift.  Muscle Tone: Tone and muscle bulk are normal in the upper and lower extremities.  Reflexes:  No Clonus Coordination: Intact finger-to-nose. No tremor.  Sensation: Intact to light touch  Gait: deferred   Psychiatric:        Behavior: Behavior normal.      ED Results and Treatments Labs (all labs ordered are listed, but only abnormal results are displayed) Labs Reviewed  COMPREHENSIVE METABOLIC PANEL - Abnormal; Notable for the following components:      Result Value   Potassium 2.6 (*)    Creatinine, Ser 1.32 (*)    Calcium 8.7 (*)    Total Protein 6.3 (*)    GFR, Estimated 52 (*)    All other components within normal limits  CBC - Abnormal; Notable for the following components:   HCT 35.6 (*)    All other components within normal limits  LIPASE, BLOOD - Abnormal; Notable for the following components:   Lipase 98 (*)    All other components within normal limits  ETHANOL  RAPID URINE DRUG SCREEN, HOSP PERFORMED  CBG MONITORING, ED  I-STAT BETA HCG BLOOD, ED ([facility], [facility], [facility] ONLY)  TROPONIN I (HIGH SENSITIVITY)  TROPONIN I (HIGH SENSITIVITY)                                                                                                                         EKG  EKG Interpretation  Date/Time:  Friday January 02 2023 21:55:59 EDT Ventricular Rate:  87 PR Interval:  168 QRS Duration: 86 QT Interval:  370 QTC Calculation: 445 R Axis:   50 Text Interpretation: Normal sinus rhythm Nonspecific ST abnormality Abnormal ECG When compared with ECG of 29-Oct-2020 11:27, PREVIOUS ECG IS PRESENT Confirmed by Addison Lank [phone]) on 01/02/2023 11:37:29 PM       Radiology CT Head Wo Contrast  Result Date: 01/02/2023 CLINICAL DATA:  Mental status change, unknown cause EXAM: CT HEAD WITHOUT CONTRAST TECHNIQUE: Contiguous axial images were obtained from the base of the skull through the vertex without intravenous contrast. RADIATION DOSE REDUCTION: This exam was performed according to the departmental dose-optimization program which includes automated exposure control, adjustment of the mA and/or kV according to patient size and/or use of iterative reconstruction technique. COMPARISON:  None Available. FINDINGS: Brain: No evidence of acute  infarction, hemorrhage, hydrocephalus, extra-axial collection or mass lesion/mass effect. 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 Vascular: No  hyperdense vessel or unexpected calcification. Skull: Normal. Negative for fracture or focal lesion. Sinuses/Orbits: No acute finding. Other: None. IMPRESSION: 1. No acute intracranial abnormality. 2. 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 Electronically Signed   By: Keane Police D.O.   On: 01/02/2023 23:38    Medications Ordered in ED Medications  diazepam (VALIUM) injection 2.5 mg (2.5 mg Intravenous Given 01/02/23 2355)  sodium chloride 0.9 % bolus 1,000 mL (0 mLs Intravenous Stopped 01/03/23 0128)  potassium chloride 10 mEq in 100 mL IVPB (0 mEq Intravenous Stopped 01/03/23 0217)  potassium chloride SA (KLOR-CON M) CR tablet 40 mEq (40 mEq Oral Given 01/03/23 0118)                                                                                                                                     Procedures Procedures  (including critical care time)  Medical Decision Making / ED Course  Click here for ABCD2, HEART and other calculators  Medical Decision Making Amount and/or Complexity of Data Reviewed Labs: ordered. Decision-making details documented in ED Course. Radiology: ordered and independent interpretation performed. Decision-making details documented in ED Course. ECG/medicine tests: ordered and independent interpretation performed. Decision-making details documented in ED Course.  Risk Prescription drug management. Parenteral controlled substances.    AMS  Differential includes but not limited to psychogenic catatonia, will assess for any electrolyte or metabolic derangements.  Will rule out intracranial process feel this is less likely. Doubt infectious process.  Patient was given a low-dose Valium without improvement  CBC without leukocytosis or anemia Metabolic panel was notable for hypokalemia at 2.6 which may be  contributing to her presentation. Mild renal sufficiency without AKI. No evidence of biliary obstruction.  Mildly elevated lipase likely from [facility] since she does not have any abdominal pain or tenderness to palpation. Cardiac workup was negative  CT head negative for ICH, mass effect.  Patient provided wiIV and oral potassium repletion. After completion of this, patient's symptoms significantly improved. she was monitored for additional several hours.  Ambulated without complication.    Hypokalemia likely from HCTZ. Patient is getting switched to spironolactone, but has not picked up the Rx yet. No K+ supplements.  Will Rx short course of K+.       Final Clinical Impression(s) / ED Diagnoses Final diagnoses:  Hypokalemic periodic paralysis   The patient appears reasonably screened and/or stabilized for discharge and I doubt any other medical condition or other [facility] requiring further screening, evaluation, or treatment in the ED at this time. I have discussed the findings, Dx and Tx plan with the patient/family who expressed understanding and agree(s) with the plan. Discharge instructions discussed at length. The patient/family was given strict return precautions who verbalized understanding of the instructions. No further questions at time of discharge.  Disposition: Discharge  Condition: Good  ED Discharge Orders          Ordered  potassium chloride (KLOR-CON) 10 MEQ tablet  Daily        01/03/23 0414              Follow Up: Cyndi Bender, PA-C [place] [address] [phone]  Call  to schedule an appointment for close follow up            This chart was dictated using voice recognition software.  Despite best efforts to proofread,  errors can occur which can change the documentation meaning.    Fatima Blank, MD 01/03/23 [phone]

## 2023-01-02 NOTE — ED Triage Notes (Addendum)
Pt from home BIB [facility], initial call was for CP, pt beating on her chest per mother and hyperventilating. Pt reported 9/10 chest pressure, while EMS was on scene pt stopped speaking, would only look at EMS. EMS reports negative stroke screen. Pt legs was tenses and shaking, mother reported pt does that after an old ankle injury. Pt not answering questions on arrival, pt noted bilateral legs trembling, no hx of seizures. Pt will shake her head "yes" or No" to questions.

## 2023-01-02 NOTE — ED Provider Notes (Incomplete)
[facility] Provider Note  CSN: [id number] Arrival date & time: 01/02/23 2153  Chief Complaint(s) Altered Mental Status  HPI Mindy Reed is a 42 y.o. female with a past medical history listed below including anxiety, depression, diabetes who presents to the emergency department for episode of altered mental status.  Patient was home in her room by herself.  Family including her 91-year-old son and her mother were in other rooms.  Family reports that they heard her scream and came and saw that she was not speaking.  Patient was reportedly sleeping at that time.   Patient is currently accompanied by her husband who is providing some of the information.  During the interview process, patient was slow to respond only nodding yes or no to certain questions however she would speak to correct her husband.  When asked if she was sleeping, patient answered yes.  She stated that it felt like she had a "night tear."  She denied any associated headache, chest pain, shortness of breath during the episode.  She denies any recent fevers or infections.  No physical complaints currently.   Altered Mental Status   Past Medical History Past Medical History:  Diagnosis Date  . Abnormally small mouth   . ADHD   . Anemia   . Anxiety   . Arthritis    right ankle  . Back pain   . Complication of anesthesia    was hard to wake up after gastric bypass; states "fought them" during induction for D & C  . Dental crowns present   . Depression   . Diabetes mellitus without complication (HCC)    diet controlled dm  . Eczema   . GERD (gastroesophageal reflux disease)    no meds currently  . GERD (gastroesophageal reflux disease)   . Hidradenitis suppurativa   . Hypertension    off bp meds x 2 years due to gastric bypass  . Infertility, female   . Joint pain   . Lower extremity edema   . 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 Obesity   . PCOS  (polycystic ovarian syndrome)   . Seasonal asthma   . SOB (shortness of breath)    Patient Active Problem List   Diagnosis Date Noted  . Attention deficit hyperactivity disorder, 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  . Depressive disorder 05/02/2021  . Hydrosalpinx 05/02/2021  . Abnormal uterine bleeding (AUB)   . History of bariatric surgery 10/02/2020  . 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 08/28/2015  . Morbid obesity ([place]) 08/27/2015  . Benign essential hypertension 07/24/2014  . Anxiety disorder 07/24/2014  . Type 2 diabetes mellitus ([place]) 07/24/2014  . Joint pain 07/24/2014  . Asthma 04/13/2014   Home Medication(s) Prior to Admission medications   Medication Sig Start Date End Date Taking? Authorizing Provider  acetaminophen (TYLENOL) 500 MG tablet Take 1,000 mg by mouth every 6 (six) hours as needed for moderate pain or headache.    [provider]  albuterol (VENTOLIN HFA) 108 (90 Base) MCG/ACT inhaler Inhale into the lungs. 11/08/20   [provider]  amphetamine-dextroamphetamine (ADDERALL XR) 20 MG 24 hr capsule Take 1 capsule (20 mg total) by mouth in the morning. 09/08/22     ascorbic acid (VITAMIN C) 500 MG tablet Take 500 mg by mouth every evening.    [provider]  buPROPion (WELLBUTRIN XL) 150 MG 24 hr tablet Take 1 tablet (150 mg  total) by mouth daily. 01/30/22   Briscoe Deutscher, DO  buPROPion (WELLBUTRIN XL) 150 MG 24 hr tablet Take 1 tablet (150 mg total) by mouth daily. 01/01/23     busPIRone (BUSPAR) 7.5 MG tablet Take 7.5 mg by mouth 2 (two) times daily.    [provider]  Cholecalciferol (VITAMIN D3) 25 MCG (1000 UT) CAPS Take by mouth.    [provider]  diphenhydrAMINE-zinc acetate (BENADRYL) cream Apply 1 application topically 3 (three) times daily as needed (eczema).    [provider]  doxycycline (MONODOX) 100 MG capsule Take 1 capsule (100 mg) by mouth 2 times daily for 10 days 12/04/22      DULoxetine (CYMBALTA) 60 MG capsule Take 60 mg by mouth daily.    [provider]  DULoxetine (CYMBALTA) 60 MG capsule Take 1 capsule (60 mg total) by mouth daily. 01/01/23     fexofenadine (ALLEGRA) 180 MG tablet Take 180 mg by mouth daily.    [provider]  fluticasone (FLONASE) 50 MCG/ACT nasal spray Place into both nostrils daily.    [provider]  L-Theanine 200 MG CAPS Take by mouth.    [provider]  linaclotide Rolan Lipa) 145 MCG CAPS capsule Take 1 capsule (145 mcg total) by mouth daily before breakfast. 01/30/22 04/30/22  Briscoe Deutscher, DO  linaclotide (LINZESS) 290 MCG CAPS capsule Take 1 capsule (290 mcg total) by mouth daily at least 30 minutes before the first meal of the day on an empty stomach 01/01/23     lisdexamfetamine (VYVANSE) 40 MG capsule Take 1 capsule (40 mg total) by mouth every morning. 12/06/22     lisdexamfetamine (VYVANSE) 40 MG capsule Take 1 capsule (40 mg total) by mouth every morning. 01/01/23     Lisdexamfetamine Dimesylate (VYVANSE) 60 MG CHEW Chew 60 mg by mouth daily at 12 noon. 11/25/21   Briscoe Deutscher, DO  Lisdexamfetamine Dimesylate (VYVANSE) 60 MG CHEW Chew 60 mg by mouth daily. 11/26/21   Briscoe Deutscher, DO  Lisdexamfetamine Dimesylate (VYVANSE) 60 MG CHEW Chew 60 mg by mouth daily. 11/26/21   Briscoe Deutscher, DO  LORazepam (ATIVAN) 0.5 MG tablet Take 0.25 mg by mouth daily as needed for anxiety.    [provider]  MAGNESIUM GLUCONATE PO Take 120 mg by mouth daily at 12 noon.    [provider]  meloxicam (MOBIC) 15 MG tablet Take 1 tablet (15 mg total) by mouth daily as needed for pain. 10/16/22     Misc Natural Products (GLUCOS-CHONDROIT-MSM COMPLEX PO) Take 1 tablet by mouth daily.    [provider]  Multiple Vitamins-Minerals (MULTIVITAMIN & MINERAL PO) Take 1 tablet by mouth 2 (two) times daily.     [provider]  omeprazole (PRILOSEC) 20 MG capsule Take 20 mg by mouth daily as  needed (heartburn). 07/24/17   [provider]  Probiotic Product (PROBIOTIC PO) Take by mouth.    [provider]  promethazine (PHENERGAN) 25 MG tablet Take 25 mg by mouth 2 (two) times daily as needed. 10/29/20   [provider]  Semaglutide, 2 MG/DOSE, (OZEMPIC, 2 MG/DOSE,) 8 MG/3ML SOPN Inject 2 mg into the skin once a week. 09/03/22     spironolactone (ALDACTONE) 50 MG tablet Take 1 tablet (50 mg total) by mouth daily. 01/01/23     tirzepatide (MOUNJARO) 10 MG/0.5ML Pen Inject 10 mg into the skin once a week. 12/04/22     tirzepatide (MOUNJARO) 10 MG/0.5ML Pen Inject 10 mg into the  skin once a week. 01/01/23     tirzepatide (MOUNJARO) 15 MG/0.5ML Pen Inject 15 mg into the skin once a week. 01/30/22   Briscoe Deutscher, DO  tirzepatide [facility]) 7.5 MG/0.5ML Pen Inject 7.5 mg into the skin once a week. 11/17/22     Tirzepatide-Weight Management (ZEPBOUND) 7.5 MG/0.5ML SOAJ Take 7.5 mg by mouth once a week. 09/29/22     Topiramate ER (TROKENDI XR) 50 MG CP24 Take 1 capsule (50 mg total) by mouth daily. 01/01/23     traZODone (DESYREL) 100 MG tablet Take 1 tablet (100 mg total) by mouth at bedtime. 11/25/21   Briscoe Deutscher, DO  traZODone (DESYREL) 100 MG tablet Take 1 tablet (100 mg total) by mouth at bedtime as needed for sleep 11/17/22     triamterene-hydrochlorothiazide (MAXZIDE) 75-50 MG tablet Take 1 tablet by mouth daily. 01/02/22   Briscoe Deutscher, DO  vitamin E 180 MG (400 UNITS) capsule Take 400 Units by mouth daily.    [provider]                                                                                                                                    Allergies Morphine and related, Adhesive [tape], Aspirin, and Nsaids  Review of Systems Review of Systems As noted in HPI  Physical Exam Vital Signs  I have reviewed the triage vital signs BP 122/69   Pulse 86   Temp (!) 97.5 F (36.4 C) (Oral)   Resp 16   Ht 5\' 7"  (1.702 m)   Wt 120 kg    LMP  (LMP Unknown)   SpO2 96%   BMI 41.43 kg/m  *** Physical Exam Vitals reviewed.  Constitutional:      General: She is not in acute distress.    Appearance: She is well-developed. She is not diaphoretic.  HENT:     Head: Normocephalic and atraumatic.     Right Ear: External ear normal.     Left Ear: External ear normal.     Nose: Nose normal.  Eyes:     General: No scleral icterus.    Conjunctiva/sclera: Conjunctivae normal.  Neck:     Trachea: Phonation normal.  Cardiovascular:     Rate and Rhythm: Normal rate and regular rhythm.  Pulmonary:     Effort: Pulmonary effort is normal. No respiratory distress.     Breath sounds: No stridor.  Abdominal:     General: There is no distension.  Musculoskeletal:        General: Normal range of motion.     Cervical back: Normal range of motion.  Neurological:     Mental Status: She is alert and oriented to person, place, and time.     Comments: Mental Status:  Alert and oriented to person, place, and time.  Attention and concentration normal.  Speech clear.  Recent memory is intact  Cranial Nerves:  II Visual Fields: Intact  to confrontation. Visual fields intact. III, IV, VI: Pupils equal and reactive to light and near. Full eye movement without nystagmus  V Facial Sensation: Normal. No weakness of masticatory muscles  VII: No facial weakness or asymmetry  VIII Auditory Acuity: Grossly normal  IX/X: The uvula is midline; the palate elevates symmetrically  XI: Normal sternocleidomastoid and trapezius strength  XII: The tongue is midline. No atrophy or fasciculations.   Motor System: Muscle Strength: 5/5 and symmetric in the upper and lower extremities. No pronation or drift.  Muscle Tone: Tone and muscle bulk are normal in the upper and lower extremities.  Reflexes:  No Clonus Coordination: Intact finger-to-nose. No tremor.  Sensation: Intact to light touch  Gait: deferred   Psychiatric:        Behavior: Behavior normal.      ED Results and Treatments Labs (all labs ordered are listed, but only abnormal results are displayed) Labs Reviewed  CBC - Abnormal; Notable for the following components:      Result Value   HCT 35.6 (*)    All other components within normal limits  ETHANOL  COMPREHENSIVE METABOLIC PANEL  RAPID URINE DRUG SCREEN, HOSP PERFORMED  LIPASE, BLOOD  CBG MONITORING, ED  I-STAT BETA HCG BLOOD, ED ([facility], [facility], [facility] ONLY)  TROPONIN I (HIGH SENSITIVITY)  TROPONIN I (HIGH SENSITIVITY)                                                                                                                         EKG  EKG Interpretation  Date/Time:  Friday January 02 2023 21:55:59 EDT Ventricular Rate:  87 PR Interval:  168 QRS Duration: 86 QT Interval:  370 QTC Calculation: 445 R Axis:   50 Text Interpretation: Normal sinus rhythm Nonspecific ST abnormality Abnormal ECG When compared with ECG of 29-Oct-2020 11:27, PREVIOUS ECG IS PRESENT Confirmed by Addison Lank [phone]) on 01/02/2023 11:37:29 PM       Radiology CT Head Wo Contrast  Result Date: 01/02/2023 CLINICAL DATA:  Mental status change, unknown cause EXAM: CT HEAD WITHOUT CONTRAST TECHNIQUE: Contiguous axial images were obtained from the base of the skull through the vertex without intravenous contrast. RADIATION DOSE REDUCTION: This exam was performed according to the departmental dose-optimization program which includes automated exposure control, adjustment of the mA and/or kV according to patient size and/or use of iterative reconstruction technique. COMPARISON:  None Available. FINDINGS: Brain: No evidence of acute infarction, hemorrhage, hydrocephalus, extra-axial collection or mass lesion/mass effect. 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 Vascular: No hyperdense vessel or unexpected calcification. Skull: Normal. Negative for fracture or focal lesion. Sinuses/Orbits: No acute finding. Other: None. IMPRESSION: 1. No acute  intracranial abnormality. 2. 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 Electronically Signed   By: Keane Police D.O.   On: 01/02/2023 23:38    Medications Ordered in ED Medications  diazepam (VALIUM) injection 2.5 mg (has no administration in time range)  sodium chloride 0.9 % bolus 1,000 mL (has no  administration in time range)                                                                                                                                     Procedures Procedures  (including critical care time)  Medical Decision Making / ED Course  Click here for ABCD2, HEART and other calculators  Medical Decision Making Amount and/or Complexity of Data Reviewed Labs: ordered. Decision-making details documented in ED Course. Radiology: ordered and independent interpretation performed. Decision-making details documented in ED Course. ECG/medicine tests: ordered and independent interpretation performed. Decision-making details documented in ED Course.  Risk Parenteral controlled substances.    AMS  Differential includes but not limited to psychogenic catatonia, will assess for any electrolyte or metabolic derangements.  Will rule out intracranial process feel this is less likely. Doubt infectious process.        Final Clinical Impression(s) / ED Diagnoses Final diagnoses:  None    {Document critical care time when appropriate:1}  {Document review of labs and clinical decision tools ie heart score, Chads2Vasc2 etc:1}  {Document your independent review of radiology images, and any outside records:1} {Document your discussion with family members, caretakers, and with consultants:1} {Document social determinants of health affecting pt's care:1} {Document your decision making why or why not admission, treatments were needed:1} This chart was dictated using voice recognition software.  Despite best efforts to proofread,  errors can occur which can change the documentation  meaning.

## 2023-01-03 ENCOUNTER — Other Ambulatory Visit (HOSPITAL_COMMUNITY): Payer: Self-pay

## 2023-01-03 LAB — COMPREHENSIVE METABOLIC PANEL
ALT: 14 U/L (ref 0–44)
AST: 22 U/L (ref 15–41)
Albumin: 3.5 g/dL (ref 3.5–5.0)
Alkaline Phosphatase: 46 U/L (ref 38–126)
Anion gap: 10 (ref 5–15)
BUN: 15 mg/dL (ref 6–20)
CO2: 29 mmol/L (ref 22–32)
Calcium: 8.7 mg/dL — ABNORMAL LOW (ref 8.9–10.3)
Chloride: 100 mmol/L (ref 98–111)
Creatinine, Ser: 1.32 mg/dL — ABNORMAL HIGH (ref 0.44–1.00)
GFR, Estimated: 52 mL/min — ABNORMAL LOW (ref >60)
Glucose, Bld: 99 mg/dL (ref 70–99)
Potassium: 2.6 mmol/L — CL (ref 3.5–5.1)
Sodium: 139 mmol/L (ref 135–145)
Total Bilirubin: 0.7 mg/dL (ref 0.3–1.2)
Total Protein: 6.3 g/dL — ABNORMAL LOW (ref 6.5–8.1)

## 2023-01-03 LAB — TROPONIN I (HIGH SENSITIVITY)
Troponin I (High Sensitivity): 5 ng/L (ref <18)
Troponin I (High Sensitivity): 6 ng/L (ref <18)

## 2023-01-03 LAB — LIPASE, BLOOD: Lipase: 98 U/L — ABNORMAL HIGH (ref 11–51)

## 2023-01-03 MED ORDER — POTASSIUM CHLORIDE CRYS ER 20 MEQ PO TBCR
40.0000 meq | EXTENDED_RELEASE_TABLET | Freq: Once | ORAL | Status: AC
  Administered 2023-01-03: 40 meq via ORAL
  Filled 2023-01-03: qty 2

## 2023-01-03 MED ORDER — POTASSIUM CHLORIDE ER 10 MEQ PO TBCR
10.0000 meq | EXTENDED_RELEASE_TABLET | Freq: Every day | ORAL | 0 refills | Status: DC
  Filled 2023-01-03 (×2): qty 5, 5d supply, fill #0

## 2023-01-03 MED ORDER — POTASSIUM CHLORIDE 10 MEQ/100ML IV SOLN
10.0000 meq | Freq: Once | INTRAVENOUS | Status: AC
  Administered 2023-01-03: 10 meq via INTRAVENOUS
  Filled 2023-01-03: qty 100

## 2023-01-07 ENCOUNTER — Encounter: Payer: Self-pay | Admitting: Cardiology

## 2023-01-07 ENCOUNTER — Encounter: Payer: Self-pay | Admitting: *Deleted

## 2023-01-07 LAB — LAB REPORT - SCANNED: EGFR: 54

## 2023-01-24 ENCOUNTER — Other Ambulatory Visit (HOSPITAL_COMMUNITY): Payer: Self-pay

## 2023-01-27 ENCOUNTER — Other Ambulatory Visit: Payer: Self-pay

## 2023-01-27 DIAGNOSIS — M549 Dorsalgia, unspecified: Secondary | ICD-10-CM | POA: Insufficient documentation

## 2023-01-27 DIAGNOSIS — J302 Other seasonal allergic rhinitis: Secondary | ICD-10-CM | POA: Insufficient documentation

## 2023-01-27 DIAGNOSIS — N979 Female infertility, unspecified: Secondary | ICD-10-CM | POA: Insufficient documentation

## 2023-01-27 DIAGNOSIS — L309 Dermatitis, unspecified: Secondary | ICD-10-CM | POA: Insufficient documentation

## 2023-01-27 DIAGNOSIS — M1990 Unspecified osteoarthritis, unspecified site: Secondary | ICD-10-CM | POA: Insufficient documentation

## 2023-01-27 DIAGNOSIS — Z98811 Dental restoration status: Secondary | ICD-10-CM | POA: Insufficient documentation

## 2023-01-27 DIAGNOSIS — Q185 Microstomia: Secondary | ICD-10-CM | POA: Insufficient documentation

## 2023-01-27 DIAGNOSIS — K219 Gastro-esophageal reflux disease without esophagitis: Secondary | ICD-10-CM | POA: Insufficient documentation

## 2023-01-27 DIAGNOSIS — T8859XA Other complications of anesthesia, initial encounter: Secondary | ICD-10-CM | POA: Insufficient documentation

## 2023-01-27 DIAGNOSIS — R600 Localized edema: Secondary | ICD-10-CM | POA: Insufficient documentation

## 2023-01-27 DIAGNOSIS — R0602 Shortness of breath: Secondary | ICD-10-CM | POA: Insufficient documentation

## 2023-01-27 DIAGNOSIS — E282 Polycystic ovarian syndrome: Secondary | ICD-10-CM | POA: Insufficient documentation

## 2023-01-27 DIAGNOSIS — D649 Anemia, unspecified: Secondary | ICD-10-CM | POA: Insufficient documentation

## 2023-01-29 ENCOUNTER — Other Ambulatory Visit (HOSPITAL_COMMUNITY): Payer: Self-pay

## 2023-02-02 ENCOUNTER — Ambulatory Visit: Payer: BC Managed Care – PPO | Admitting: Cardiology

## 2023-02-02 ENCOUNTER — Other Ambulatory Visit (HOSPITAL_COMMUNITY): Payer: Self-pay

## 2023-02-02 ENCOUNTER — Other Ambulatory Visit: Payer: Self-pay

## 2023-02-02 MED ORDER — SPIRONOLACTONE 50 MG PO TABS
50.0000 mg | ORAL_TABLET | Freq: Every day | ORAL | 1 refills | Status: DC
  Filled 2023-02-02: qty 30, 30d supply, fill #0
  Filled 2023-03-02: qty 30, 30d supply, fill #1

## 2023-02-05 ENCOUNTER — Other Ambulatory Visit (HOSPITAL_COMMUNITY): Payer: Self-pay

## 2023-02-10 ENCOUNTER — Ambulatory Visit: Payer: BC Managed Care – PPO | Admitting: Cardiology

## 2023-02-25 ENCOUNTER — Telehealth: Payer: Self-pay

## 2023-02-25 NOTE — Telephone Encounter (Signed)
   Pre-operative Risk Assessment    Patient Name: Mindy Reed  DOB: 1981/08/19 MRN: [id number]      Request for Surgical Clearance    Procedure:   LAVH, DS possible TAH, DS  Date of Surgery:  Clearance 03/09/23                                 Surgeon:   Surgeon's Group or Practice Name:  [facility] Phone number:  [phone] Fax number:  [phone]   Type of Clearance Requested:   - Medical    Type of Anesthesia:  General    Additional requests/questions:    SignedDione Housekeeper   02/25/2023, 3:38 PM

## 2023-02-25 NOTE — Telephone Encounter (Signed)
Pre-op team,  This patient has never been seen by our office.   Thank you!  DW

## 2023-02-26 NOTE — Telephone Encounter (Signed)
Pt has new pt ppt 03/09/23 with Dr. Mathis Bud. Pt's surgery will need to be post poned until the pt has been seen and evaluated and cleared by the cardiologist.   I will update all parties involved.

## 2023-03-02 ENCOUNTER — Other Ambulatory Visit (HOSPITAL_COMMUNITY): Payer: Self-pay

## 2023-03-03 ENCOUNTER — Other Ambulatory Visit: Payer: Self-pay

## 2023-03-03 ENCOUNTER — Other Ambulatory Visit (HOSPITAL_COMMUNITY): Payer: Self-pay

## 2023-03-03 MED ORDER — LISDEXAMFETAMINE DIMESYLATE 40 MG PO CAPS
40.0000 mg | ORAL_CAPSULE | Freq: Every morning | ORAL | 0 refills | Status: DC
  Filled 2023-03-03: qty 30, 30d supply, fill #0

## 2023-03-06 ENCOUNTER — Other Ambulatory Visit (HOSPITAL_COMMUNITY): Payer: Self-pay

## 2023-03-07 NOTE — Progress Notes (Unsigned)
Cardiology Office Note:    Date:  03/09/2023   ID:  Mindy Reed, DOB 02-20-1981, MRN [id number]  PCP:  Lonie Peak, PA-C  Cardiologist:  Norman Herrlich, MD   Referring MD: Lonie Peak, PA-C  ASSESSMENT:    1. Chest pain of uncertain etiology   2. SOB (shortness of breath)   3. Hypokalemia   4. Benign essential hypertension   5. Palpitations   6. Attention deficit hyperactivity disorder, predominantly inattentive type    PLAN:    In order of problems listed above:  Multiple cardiovascular concerns versus chest pain associated with a family history hypertension and morbid obesity improved after bariatric surgery.  For further evaluation cardiac CTA will also give his coronary calcium score due to lipid-lowering therapy. Shortness of breath concerning for underlying heart disease hypertensive heart and pulmonary artery hypertension echocardiogram is ordered Continue spironolactone I told him to take a thiazide diuretic again in the future For now I do not think there is any reason to stop her vynase pending on the results of testing  Next appointment 1 month   Medication Adjustments/Labs and Tests Ordered: Current medicines are reviewed at length with the patient today.  Concerns regarding medicines are outlined above.  No orders of the defined types were placed in this encounter.  No orders of the defined types were placed in this encounter.    Chief Complaint  Patient presents with   Shortness of Breath    History of Present Illness:    Mindy Reed is a 42 y.o. female who is being seen today for the evaluation of SOB at the request of Lonie Peak, [place].  Her record review shows that she was seen in the emergency room 01/02/2023 with altered mental status palpitations found to have severe hypokalemia with a potassium of 3.6 renal dysfunction creatinine 1.32 GFR 52 cc/min and was discharged home from the hospital after potassium supplementation.  Subsequently her  thiazide diuretic was discontinued and she was placed on spironolactone.  At the office visit she was complaining of exertional shortness of breath she is being followed by bariatric surgery team with a weight exceeding 500 pounds and BMI greater than 50.  It was noted that she takes stimulant therapy for ADHD which was thought to perhaps be contributing to her complaints of palpitation.  With her shortness of breath she was referred to cardiology.  Other problems included type 2 diabetes hypertension ADHD and anxiety.  She had an EKG 01/02/2023 showing sinus rhythm and nonspecific ST abnormality. Although not having chest pain she had a high-sensitivity troponin performed in the emergency room 01/02/2023 which was normal.  CBC hemoglobin 12.7.  She is concerned that she has heart disease He tells me about 20 years ago she saw cardiologist a stress test was performed and was told she was normal She had an ankle injury and after an activity in March began to experience shortness of breath that occurs indoors with housework climbing stairs even at times after a long shower.  The symptoms have progressed and not improved she has asthma this is different she is not having cough and wheezing she is not having orthopnea but she does have chronic lower extremity edema which is the reason that she initially took a thiazide diuretic.  She has no known history of heart disease congenital rheumatic or atrial fibrillation She has had nonexertional chest pressure in the left chest at times even awaken her from sleep at nighttime symptoms lasted few minutes and resolve  spontaneously There has no been no recurrence of the palpitation she had associated with hypokalemia Past Medical History:  Diagnosis Date   Abnormal uterine bleeding (AUB)    Abnormally small mouth    Anemia    Anxiety disorder 07/24/2014   Arthritis    right ankle   Asthma 04/13/2014   Attention deficit hyperactivity disorder, predominantly  inattentive type 05/02/2021   Back pain    Benign essential hypertension 07/24/2014   Complication of anesthesia    was hard to wake up after gastric bypass; states "fought them" during induction for D & C   Dental crowns present    Depressive disorder 05/02/2021   Eczema    Elevated platelet count 07/11/2014   Formatting of this note might be different from the original. Elevated Platelet count in pregnancy 493 in 03/2014 repeat was 482, per Dr. Marjo Bicker refer to hematology 07/11/2014 Formatting of this note might be different from the original. Elevated Platelet count in pregnancy 493 in 03/2014 repeat was 482, per Dr. 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   Obesity    PCOS (polycystic ovarian syndrome)    Pre-existing hypertension during pregnancy in first trimester 04/13/2014   Seasonal allergies    Seasonal asthma    SOB (shortness of breath)    Type 2 diabetes mellitus (HCC) 07/24/2014    Past Surgical History:  Procedure Laterality Date   BREAST BIOPSY Left 2017 nov   desmoid tumor benign    BREAST EXCISIONAL BIOPSY Left    CHOLECYSTECTOMY     DG BARIUM SWALLOW ([facility] HX)  08/27/2015   ESOPHAGOGASTRODUODENOSCOPY (EGD) WITH PROPOFOL N/A 08/17/2017   Procedure: ESOPHAGOGASTRODUODENOSCOPY (EGD) WITH PROPOFOL;  Surgeon: Luretha Murphy, MD;  Location: [facility];  Service: General;  Laterality: N/A;   FLEXIBLE SIGMOIDOSCOPY  as teenager   HIGH RISK BREAST EXCISION     left breast exc bx 2017 desmoid tumor   HYSTEROSCOPY WITH D & C N/A 07/18/2015   Procedure: DILATATION AND CURETTAGE /HYSTEROSCOPY;  Surgeon: Harold Hedge, MD;  Location: [facility];  Service:  Gynecology;  Laterality: N/A;   HYSTEROSCOPY WITH D & C N/A 11/07/2020   Procedure: DILATATION AND CURETTAGE /HYSTEROSCOPY WITH MYOSURE;  Surgeon: Carver Fila, MD;  Location: [facility];  Service: Gynecology;  Laterality: N/A;   INTRAUTERINE DEVICE (IUD) INSERTION N/A 07/18/2015   Procedure: INTRAUTERINE DEVICE (IUD) INSERTION;  Surgeon: Harold Hedge, MD;  Location: [facility];  Service: Gynecology;  Laterality: N/A;   INTRAUTERINE DEVICE (IUD) INSERTION N/A 11/07/2020   Procedure: INTRAUTERINE DEVICE (IUD) INSERTION MIRENA;  Surgeon: Carver Fila, MD;  Location: [facility];  Service: Gynecology;  Laterality: N/A;   LAPAROSCOPIC ROUX-EN-Y GASTRIC BYPASS WITH HIATAL HERNIA REPAIR  08/27/2015   Procedure: LAPAROSCOPIC ROUX-EN-Y GASTRIC BYPASS WITH HIATAL HERNIA REPAIR;  Surgeon: Luretha Murphy, MD;  Location: [facility];  Service: General;;   ORIF ANKLE FRACTURE Right    OVARIAN CYST REMOVAL     RADIOACTIVE SEED GUIDED EXCISIONAL BREAST BIOPSY Left 09/03/2016   Procedure: LEFT RADIOACTIVE SEED GUIDED EXCISIONAL BREAST BIOPSY;  Surgeon: Luretha Murphy, MD;  Location: [facility];  Service: General;  Laterality: Left;   TONSILLECTOMY AND ADENOIDECTOMY     UPPER GI ENDOSCOPY  08/27/2015   Procedure: UPPER GI ENDOSCOPY;  Surgeon: Luretha Murphy, MD;  Location: [facility];  Service: General;;    Current Medications: Current Meds  Medication Sig   acetaminophen (TYLENOL) 500 MG tablet Take 1,000 mg by mouth every 6 (six) hours as needed for moderate pain or headache.   albuterol (VENTOLIN HFA) 108 (90 Base) MCG/ACT inhaler Inhale 1-2 puffs into the lungs every 4 (four) hours as needed for wheezing or shortness of breath.   Ascorbic Acid (VITAMIN C) 100 MG tablet Take 100 mg by mouth daily.   busPIRone (BUSPAR) 7.5 MG tablet Take 7.5 mg by mouth 3 (three) times daily.   CALCIUM CITRATE PO Take 500 mg by mouth daily.   DULoxetine (CYMBALTA) 60 MG capsule Take 60 mg by mouth daily.    fexofenadine (ALLEGRA) 180 MG tablet Take 180 mg by mouth daily.   gabapentin (NEURONTIN) 300 MG capsule Take 300 mg by mouth daily.   lisdexamfetamine (VYVANSE) 40 MG capsule Take 1 capsule (40 mg total) by mouth every morning.   LORazepam (ATIVAN) 0.5 MG tablet Take 0.25 mg by mouth 2 (two) times daily as needed for anxiety.   meloxicam (MOBIC) 15 MG tablet Take 1 tablet (15 mg total) by mouth daily as needed for pain.   Multiple Vitamins-Minerals (MULTIVITAMIN & MINERAL PO) Take 1 tablet by mouth 2 (two) times daily.    promethazine (PHENERGAN) 25 MG tablet Take 25 mg by mouth 2 (two) times daily as needed for nausea.   spironolactone (ALDACTONE) 50 MG tablet Take 1 tablet (50 mg total) by mouth daily.   tirzepatide (MOUNJARO) 10 MG/0.5ML Pen Inject 10 mg into the skin once a week.   tiZANidine (ZANAFLEX) 4 MG capsule Take 4 mg by mouth at bedtime.   topiramate (TOPAMAX) 50 MG tablet Take 50 mg by mouth daily.   traZODone (DESYREL) 100 MG tablet Take 1 tablet (100 mg total) by mouth at bedtime.   VITAMIN E PO Take 100 Units by mouth daily.     Allergies:   Morphine and codeine, Adhesive [tape], Aspirin, and Nsaids   Social History   Socioeconomic History   Marital status: Married    Spouse name: Not on file   Number of children: 1   Years of education: Not on file   Highest education level: Not on file  Occupational History   Occupation: [employer], general practice  Tobacco Use   Smoking status: Never   Smokeless tobacco: Never  Vaping Use   Vaping Use: Never used  Substance and Sexual Activity   Alcohol use: No   Drug use: No   Sexual activity: Yes    Birth control/protection: Abstinence    Comment: since 08/2020  Other Topics Concern   Not on file  Social History Narrative   Not on file   Social Determinants of Health   Financial Resource Strain: Not on file  Food Insecurity: Not on file  Transportation Needs: Not on file  Physical Activity: Not on  file  Stress: Not on file  Social Connections: Not on file     Family History: The patient's family history includes Anxiety disorder in her father and mother; Depression in her father and mother; Diabetes in her father and mother; Heart disease in her father; Heart failure in her father; Hypertension in her father and mother; Kidney disease in her father and mother; Liver  disease in her father; Migraines in her mother; Obesity in her father and mother; Osteoarthritis in her father and mother; Other in her maternal grandmother; Sleep apnea in her mother. There is no history of Breast cancer, Ovarian cancer, or Colon cancer.  ROS:   ROS Please see the history of present illness.     All other systems reviewed and are negative.  EKGs/Labs/Other Studies Reviewed:    The following studies were reviewed today: Sinus rhythm      EKG:  EKG is  ordered today.  The ekg ordered today is personally reviewed and demonstrates and is normal  Recent Labs: 01/02/2023: ALT 14; BUN 15; Creatinine, Ser 1.32; Hemoglobin 12.7; Platelets 340; Potassium 2.6; Sodium 139  Recent Lipid Panel    Component Value Date/Time   CHOL 229 (H) 05/02/2021 1623   TRIG 108 05/02/2021 1623   HDL 65 05/02/2021 1623   LDLCALC 145 (H) 05/02/2021 1623    Physical Exam:    VS:  BP 134/88 (BP Location: Right Arm, Patient Position: Sitting, Cuff Size: Large)   Pulse 74   Ht 5' 6.75" (1.695 m)   Wt 249 lb 3.2 oz (113 kg)   SpO2 97%   BMI 39.32 kg/m     Wt Readings from Last 3 Encounters:  03/09/23 249 lb 3.2 oz (113 kg)  01/05/23 259 lb (117.5 kg)  01/02/23 264 lb 8.8 oz (120 kg)     GEN:  Well nourished, well developed in no acute distress HEENT: Normal NECK: No JVD; No carotid bruits LYMPHATICS: No lymphadenopathy CARDIAC: RRR, no murmurs, rubs, gallops RESPIRATORY:  Clear to auscultation without rales, wheezing or rhonchi  ABDOMEN: Soft, non-tender, non-distended MUSCULOSKELETAL:  No edema; No deformity   SKIN: Warm and dry NEUROLOGIC:  Alert and oriented x 3 PSYCHIATRIC:  Normal affect     Signed, Norman Herrlich, MD  03/09/2023 1:25 PM    [facility]

## 2023-03-09 ENCOUNTER — Ambulatory Visit: Payer: BC Managed Care – PPO | Attending: Cardiology | Admitting: Cardiology

## 2023-03-09 ENCOUNTER — Encounter: Payer: Self-pay | Admitting: Cardiology

## 2023-03-09 VITALS — BP 134/88 | HR 74 | Ht 66.75 in | Wt 249.2 lb

## 2023-03-09 DIAGNOSIS — I10 Essential (primary) hypertension: Secondary | ICD-10-CM | POA: Diagnosis not present

## 2023-03-09 DIAGNOSIS — E876 Hypokalemia: Secondary | ICD-10-CM | POA: Diagnosis not present

## 2023-03-09 DIAGNOSIS — R002 Palpitations: Secondary | ICD-10-CM

## 2023-03-09 DIAGNOSIS — R0602 Shortness of breath: Secondary | ICD-10-CM | POA: Diagnosis not present

## 2023-03-09 DIAGNOSIS — R072 Precordial pain: Secondary | ICD-10-CM

## 2023-03-09 DIAGNOSIS — R079 Chest pain, unspecified: Secondary | ICD-10-CM | POA: Diagnosis not present

## 2023-03-09 DIAGNOSIS — F900 Attention-deficit hyperactivity disorder, predominantly inattentive type: Secondary | ICD-10-CM

## 2023-03-09 DIAGNOSIS — E119 Type 2 diabetes mellitus without complications: Secondary | ICD-10-CM

## 2023-03-09 MED ORDER — METOPROLOL TARTRATE 100 MG PO TABS: 100.0000 mg | ORAL_TABLET | Freq: Once | ORAL | 0 refills | Status: DC

## 2023-03-09 NOTE — Patient Instructions (Signed)
Medication Instructions:  Your physician recommends that you continue on your current medications as directed. Please refer to the Current Medication list given to you today.  *If you need a refill on your cardiac medications before your next appointment, please call your pharmacy*   Lab Work: Your physician recommends that you return for lab work in:   Labs 1 week before CT: BMP  If you have labs (blood work) drawn today and your tests are completely normal, you will receive your results only by: MyChart Message (if you have MyChart) OR A paper copy in the mail If you have any lab test that is abnormal or we need to change your treatment, we will call you to review the results.   Testing/Procedures: Your physician has requested that you have an echocardiogram. Echocardiography is a painless test that uses sound waves to create images of your heart. It provides your doctor with information about the size and shape of your heart and how well your heart's chambers and valves are working. This procedure takes approximately one hour. There are no restrictions for this procedure. Please do NOT wear cologne, perfume, aftershave, or lotions (deodorant is allowed). Please arrive 15 minutes prior to your appointment time.    Your cardiac CT will be scheduled at one of the below locations:   [facility] [address] [place], [place] [address] [phone]  OR  [facility] [address] [place], [place] [address] [phone]  OR   [facility] [address] [place], [place] [address] [phone]  If scheduled at [facility], please arrive at the [facility] En[facility]ance (Entrance C2) of [facility] 30 minutes prior to test start time. You can use the FREE valet parking offered at entrance C (encouraged to control the heart rate for the test)  Proceed to the [facility]  Radiology Department (first floor) to check-in and test prep.  All radiology patients and guests should use entrance C2 at [facility], accessed from [facility], even though the hospital's physical address listed is [address]    If scheduled at [facility] or [facility], please arrive 15 mins early for check-in and test prep.   Please follow these instructions carefully (unless otherwise directed):   On the Night Before the Test: Be sure to Drink plenty of water. Do not consume any caffeinated/decaffeinated beverages or chocolate 12 hours prior to your test. Do not take any antihistamines 12 hours prior to your test.  On the Day of the Test: Drink plenty of water until 1 hour prior to the test. Do not eat any food 1 hour prior to test. You may take your regular medications prior to the test.  Take metoprolol (Lopressor) two hours prior to test. If you take Spironolactone, please HOLD on the morning of the test. FEMALES- please wear underwire-free bra if available, avoid dresses & tight clothing      After the Test: Drink plenty of water. After receiving IV contrast, you may experience a mild flushed feeling. This is normal. On occasion, you may experience a mild rash up to 24 hours after the test. This is not dangerous. If this occurs, you can take Benadryl 25 mg and increase your fluid intake. If you experience trouble breathing, this can be serious. If it is severe call 911 IMMEDIATELY. If it is mild, please call our office. If you  take any of these medications: Glipizide/Metformin, Avandament, Glucavance, please do not take 48 hours after completing test unless otherwise instructed.  We will call to schedule your test 2-4 weeks out understanding that some insurance companies will need an authorization prior to the service being performed.   For non-scheduling related questions, please contact the  cardiac imaging nurse navigator should you have any questions/concerns: Rockwell Alexandria, Cardiac Imaging Nurse Navigator Larey Brick, Cardiac Imaging Nurse Navigator [facility] Direct Office Dial: [phone]   For scheduling needs, including cancellations and rescheduling, please call [place], [phone].    Follow-Up: At [facility], you and your health needs are our priority.  As part of our continuing mission to provide you with exceptional heart care, we have created designated Provider Care Teams.  These Care Teams include your primary Cardiologist (physician) and Advanced Practice Providers (APPs -  Physician Assistants and Nurse Practitioners) who all work together to provide you with the care you need, when you need it.  We recommend signing up for the patient portal called "MyChart".  Sign up information is provided on this After Visit Summary.  MyChart is used to connect with patients for Virtual Visits (Telemedicine).  Patients are able to view lab/test results, encounter notes, upcoming appointments, etc.  Non-urgent messages can be sent to your provider as well.   To learn more about what you can do with MyChart, go to [email or web address].    Your next appointment:   1 month(s)  Provider:   Norman Herrlich, MD    Other Instructions None

## 2023-03-10 ENCOUNTER — Other Ambulatory Visit (HOSPITAL_COMMUNITY): Payer: Self-pay

## 2023-03-11 ENCOUNTER — Other Ambulatory Visit (HOSPITAL_COMMUNITY): Payer: Self-pay

## 2023-03-11 MED ORDER — RIFAMPIN 300 MG PO CAPS
300.0000 mg | ORAL_CAPSULE | Freq: Two times a day (BID) | ORAL | 0 refills | Status: DC
  Filled 2023-03-11 – 2023-03-19 (×2): qty 14, 7d supply, fill #0

## 2023-03-11 MED ORDER — CLINDAMYCIN HCL 300 MG PO CAPS
300.0000 mg | ORAL_CAPSULE | Freq: Two times a day (BID) | ORAL | 0 refills | Status: DC
  Filled 2023-03-11 – 2023-03-19 (×2): qty 14, 7d supply, fill #0

## 2023-03-12 ENCOUNTER — Other Ambulatory Visit (HOSPITAL_COMMUNITY): Payer: Self-pay

## 2023-03-12 MED ORDER — MOUNJARO 10 MG/0.5ML ~~LOC~~ SOAJ
SUBCUTANEOUS | 2 refills | Status: DC
  Filled 2023-03-29: qty 2, 28d supply, fill #0

## 2023-03-12 MED ORDER — MOUNJARO 12.5 MG/0.5ML ~~LOC~~ SOAJ
SUBCUTANEOUS | 2 refills | Status: DC
  Filled 2023-05-15: qty 2, 28d supply, fill #0
  Filled 2023-06-08: qty 2, 28d supply, fill #1
  Filled 2023-08-10 – 2023-10-09 (×2): qty 2, 28d supply, fill #2

## 2023-03-12 MED ORDER — LISDEXAMFETAMINE DIMESYLATE 40 MG PO CAPS
ORAL_CAPSULE | ORAL | 0 refills | Status: DC
  Filled 2023-03-12 – 2023-04-03 (×2): qty 30, 30d supply, fill #0

## 2023-03-12 MED ORDER — MOUNJARO 15 MG/0.5ML ~~LOC~~ SOAJ
SUBCUTANEOUS | 2 refills | Status: DC
  Filled 2023-07-09: qty 2, 28d supply, fill #0
  Filled 2023-08-09: qty 2, 28d supply, fill #1
  Filled 2023-09-07: qty 2, 28d supply, fill #2

## 2023-03-18 ENCOUNTER — Telehealth: Payer: Self-pay | Admitting: Cardiology

## 2023-03-18 LAB — BASIC METABOLIC PANEL
BUN/Creatinine Ratio: 13 (ref 9–23)
BUN: 14 mg/dL (ref 6–24)
CO2: 21 mmol/L (ref 20–29)
Calcium: 8.7 mg/dL (ref 8.7–10.2)
Chloride: 103 mmol/L (ref 96–106)
Creatinine, Ser: 1.1 mg/dL — ABNORMAL HIGH (ref 0.57–1.00)
Glucose: 60 mg/dL — ABNORMAL LOW (ref 70–99)
Potassium: 4.5 mmol/L (ref 3.5–5.2)
Sodium: 136 mmol/L (ref 134–144)
eGFR: 64 mL/min/{1.73_m2} (ref >59)

## 2023-03-18 NOTE — Telephone Encounter (Signed)
Pt returning nurses call regarding test results. Please advise 

## 2023-03-19 ENCOUNTER — Other Ambulatory Visit (HOSPITAL_COMMUNITY): Payer: Self-pay

## 2023-03-19 ENCOUNTER — Telehealth (HOSPITAL_COMMUNITY): Payer: Self-pay | Admitting: Emergency Medicine

## 2023-03-19 NOTE — Telephone Encounter (Signed)
Reaching out to patient to offer assistance regarding upcoming cardiac imaging study; pt verbalizes understanding of appt date/time, parking situation and where to check in, pre-test NPO status and medications ordered, and verified current allergies; name and call back number provided for further questions should they arise Tyriq Moragne RN Navigator Cardiac Imaging [facility] [phone] office [phone] cell 

## 2023-03-20 ENCOUNTER — Other Ambulatory Visit: Payer: Self-pay

## 2023-03-20 ENCOUNTER — Ambulatory Visit (HOSPITAL_COMMUNITY)
Admission: RE | Admit: 2023-03-20 | Discharge: 2023-03-20 | Disposition: A | Discharge status: Home / Self Care | Payer: BC Managed Care – PPO | Source: Ambulatory Visit | Attending: Cardiology | Admitting: Cardiology

## 2023-03-20 DIAGNOSIS — R072 Precordial pain: Secondary | ICD-10-CM | POA: Insufficient documentation

## 2023-03-20 MED ORDER — NITROGLYCERIN 0.4 MG SL SUBL
SUBLINGUAL_TABLET | SUBLINGUAL | Status: AC
  Filled 2023-03-20: qty 2

## 2023-03-20 MED ORDER — PRAVASTATIN SODIUM 20 MG PO TABS: 20.0000 mg | ORAL_TABLET | Freq: Every evening | ORAL | 3 refills | Status: AC

## 2023-03-20 MED ORDER — NITROGLYCERIN 0.4 MG SL SUBL
0.8000 mg | SUBLINGUAL_TABLET | Freq: Once | SUBLINGUAL | Status: AC
  Administered 2023-03-20: 0.8 mg via SUBLINGUAL

## 2023-03-20 MED ORDER — IOHEXOL 350 MG/ML SOLN
110.0000 mL | Freq: Once | INTRAVENOUS | Status: AC | PRN
  Administered 2023-03-20: 110 mL via INTRAVENOUS

## 2023-03-30 ENCOUNTER — Other Ambulatory Visit (HOSPITAL_COMMUNITY): Payer: Self-pay

## 2023-04-01 ENCOUNTER — Encounter (HOSPITAL_BASED_OUTPATIENT_CLINIC_OR_DEPARTMENT_OTHER): Payer: Self-pay | Admitting: Obstetrics and Gynecology

## 2023-04-01 ENCOUNTER — Ambulatory Visit: Payer: BC Managed Care – PPO | Attending: Cardiology

## 2023-04-01 DIAGNOSIS — F900 Attention-deficit hyperactivity disorder, predominantly inattentive type: Secondary | ICD-10-CM | POA: Diagnosis not present

## 2023-04-01 DIAGNOSIS — R079 Chest pain, unspecified: Secondary | ICD-10-CM

## 2023-04-01 DIAGNOSIS — E876 Hypokalemia: Secondary | ICD-10-CM

## 2023-04-01 DIAGNOSIS — R002 Palpitations: Secondary | ICD-10-CM

## 2023-04-01 DIAGNOSIS — R072 Precordial pain: Secondary | ICD-10-CM

## 2023-04-01 DIAGNOSIS — R0602 Shortness of breath: Secondary | ICD-10-CM | POA: Diagnosis not present

## 2023-04-01 DIAGNOSIS — I10 Essential (primary) hypertension: Secondary | ICD-10-CM | POA: Diagnosis not present

## 2023-04-01 LAB — ECHOCARDIOGRAM COMPLETE: S' Lateral: 3.6 cm

## 2023-04-02 ENCOUNTER — Telehealth: Payer: Self-pay

## 2023-04-02 NOTE — Telephone Encounter (Signed)
   Primary Cardiologist: Norman Herrlich, MD  Chart reviewed as part of pre-operative protocol coverage. Mindy Reed was seen by Dr. Dulce Sellar on 03/09/23 for evaluation of chest pain and underwent coronary CTA and echo which did not reveal any concerning findings. Her RCRI for MACE is 0.4%, Class I.   Given past medical history and time since last visit, based on ACC/AHA guidelines, Mindy Reed would be at acceptable risk for the planned procedure without further cardiovascular testing.   Patient was advised that if she develops new symptoms prior to surgery to contact our office to arrange a follow-up appointment. She verbalized understanding.  No cardiac medications to hold.   I will route this recommendation to the requesting party via Epic fax function and remove from pre-op pool.  Please call with questions.  Levi Aland, NP-C  04/02/2023, 11:35 AM [address] Office [phone] Fax [phone]

## 2023-04-02 NOTE — Telephone Encounter (Signed)
   Pre-operative Risk Assessment    Patient Name: Mindy Reed  DOB: April 11, 1981 MRN: [id number]      Request for Surgical Clearance    Procedure:   LAVH, DS. Possible TAH, DS  Date of Surgery:  Clearance 04/09/23                                 Surgeon:  Roselle Locus, M.D. Surgeon's Group or Practice Name:  [facility]  Phone number:  [phone] Fax number:  [phone]  Attn: Surgery Scheduler   Type of Clearance Requested:   - Medical    Type of Anesthesia:  General    Additional requests/questions:  Please fax a copy of signed surgical clearance form to the surgeon's office.  Signed, Viviano Simas   04/02/2023, 11:00 AM

## 2023-04-03 ENCOUNTER — Encounter (HOSPITAL_BASED_OUTPATIENT_CLINIC_OR_DEPARTMENT_OTHER): Payer: Self-pay | Admitting: Obstetrics and Gynecology

## 2023-04-03 ENCOUNTER — Other Ambulatory Visit (HOSPITAL_COMMUNITY): Payer: Self-pay

## 2023-04-03 ENCOUNTER — Other Ambulatory Visit: Payer: Self-pay

## 2023-04-03 NOTE — Progress Notes (Signed)
Your procedure is scheduled on Thursday, 04/09/23.  Report to [facility] [place] AT  5:30 AM.   Call this number if you have problems the morning of surgery  [phone].   OUR ADDRESS IS [address].  WE ARE LOCATED IN [facility].  PLEASE BRING YOUR INSURANCE CARD AND PHOTO ID DAY OF SURGERY.  ONLY 2 PEOPLE ARE ALLOWED IN  WAITING  ROOM                                      REMEMBER:  DO NOT EAT FOOD, CANDY GUM OR MINTS  AFTER MIDNIGHT THE NIGHT BEFORE YOUR SURGERY . YOU MAY HAVE CLEAR LIQUIDS FROM MIDNIGHT THE NIGHT BEFORE YOUR SURGERY UNTIL  4:30 AM. NO CLEAR LIQUIDS AFTER   4:30 AM DAY OF SURGERY.  YOU MAY  BRUSH YOUR TEETH MORNING OF SURGERY AND RINSE YOUR MOUTH OUT, NO CHEWING GUM CANDY OR MINTS.     CLEAR LIQUID DIET    Allowed      Water                                                                   Coffee and tea, regular and decaf  (NO cream or milk products of any type, may sweeten)                         Carbonated beverages, regular and diet                                    Sports drinks like Gatorade _____________________________________________________________________     TAKE ONLY THESE MEDICATIONS MORNING OF SURGERY: Albuterol inhaler (please bring), Buspar, Cymbalta, Allegra, Neurontin, Ativan if needed, Topamax, Omeprazole, Voltaren gel if needed                                        DO NOT WEAR JEWERLY/  METAL/  PIERCINGS (INCLUDING NO PLASTIC PIERCINGS) DO NOT WEAR LOTIONS, POWDERS, PERFUMES OR NAIL POLISH ON YOUR FINGERNAILS. TOENAIL POLISH IS OK TO WEAR. DO NOT SHAVE FOR 48 HOURS PRIOR TO DAY OF SURGERY.  CONTACTS, GLASSES, OR DENTURES MAY NOT BE WORN TO SURGERY.  REMEMBER: NO SMOKING, VAPING ,  DRUGS OR ALCOHOL FOR 24 HOURS BEFORE YOUR SURGERY.                                    [facility] IS NOT RESPONSIBLE  FOR ANY BELONGINGS.                                                                    Marland Kitchen  [facility] - Preparing for Surgery Before surgery, you can play an important role.  Because skin is not sterile, your skin needs to be as free of germs as possible.  You can reduce the number of germs on your skin by washing with CHG (chlorahexidine gluconate) soap before surgery.  CHG is an antiseptic cleaner which kills germs and bonds with the skin to continue killing germs even after washing. Please DO NOT use if you have an allergy to CHG or antibacterial soaps.  If your skin becomes reddened/irritated stop using the CHG and inform your nurse when you arrive at Short Stay. Do not shave (including legs and underarms) for at least 48 hours prior to the first CHG shower.  You may shave your face/neck. Please follow these instructions carefully:  1.  Shower with CHG Soap the night before surgery and the  morning of Surgery.  2.  If you choose to wash your hair, wash your hair first as usual with your  normal  shampoo.  3.  After you shampoo, rinse your hair and body thoroughly to remove the  shampoo.                                        4.  Use CHG as you would any other liquid soap.  You can apply chg directly  to the skin and wash , chg soap provided, night before and morning of your surgery.  5.  Apply the CHG Soap to your body ONLY FROM THE NECK DOWN.   Do not use on face/ open                           Wound or open sores. Avoid contact with eyes, ears mouth and genitals (private parts).                       Wash face,  Genitals (private parts) with your normal soap.             6.  Wash thoroughly, paying special attention to the area where your surgery  will be performed.  7.  Thoroughly rinse your body with warm water from the neck down.  8.  DO NOT shower/wash with your normal soap after using and rinsing off  the CHG Soap.             9.  Pat yourself dry with a clean towel.            10.  Wear clean pajamas.            11.  Place clean sheets on your bed the night of your  first shower and do not  sleep with pets. Day of Surgery : Do not apply any lotions/ powders the morning of surgery.  Please wear clean clothes to the hospital/surgery center.  IF YOU HAVE ANY SKIN IRRITATION OR PROBLEMS WITH THE SURGICAL SOAP, PLEASE GET A BAR OF GOLD DIAL SOAP AND SHOWER THE NIGHT BEFORE YOUR SURGERY AND THE MORNING OF YOUR SURGERY. PLEASE LET THE NURSE KNOW MORNING OF YOUR SURGERY IF YOU HAD ANY PROBLEMS WITH THE SURGICAL SOAP.   YOUR SURGEON MAY HAVE REQUESTED EXTENDED RECOVERY TIME AFTER YOUR SURGERY. IT COULD BE A  JUST A FEW HOURS  UP TO AN OVERNIGHT STAY.  YOUR SURGEON SHOULD HAVE DISCUSSED  THIS WITH YOU PRIOR TO YOUR SURGERY. IN THE EVENT YOU NEED TO STAY OVERNIGHT PLEASE REFER TO THE FOLLOWING GUIDELINES. YOU MAY HAVE UP TO 4 VISITORS  MAY VISIT IN THE EXTENDED RECOVERY ROOM UNTIL 800 PM ONLY.  ONE  VISITOR AGE 23 AND OVER MAY SPEND THE NIGHT AND MUST BE IN EXTENDED RECOVERY ROOM NO LATER THAN 800 PM . YOUR DISCHARGE TIME AFTER YOU SPEND THE NIGHT IS 900 AM THE MORNING AFTER YOUR SURGERY. YOU MAY PACK A SMALL OVERNIGHT BAG WITH TOILETRIES FOR YOUR OVERNIGHT STAY IF YOU WISH.  REGARDLESS OF IF YOU STAY OVER NIGHT OR ARE DISCHARGED THE SAME DAY YOU WILL BE REQUIRED TO HAVE A RESPONSIBLE ADULT (18 YRS OLD OR OLDER) STAY WITH YOU FOR AT LEAST THE FIRST 24 HOURS  YOUR PRESCRIPTION MEDICATIONS WILL BE PROVIDED DURING YOUR HOSPITAL STAY.  ________________________________________________________________________                                                        QUESTIONS Mechele Claude PRE OP NURSE PHONE [phone].

## 2023-04-03 NOTE — Progress Notes (Signed)
Spoke w/ via phone for pre-op interview---Jamya Lab needs dos----  urine pregnancy             Lab results------04/06/23 cbc, cmp, type & screen, hiv, 01/02/23 EKG in chart & Epic. COVID test -----patient states asymptomatic no test needed Arrive at -------0530 on Thursday, 04/09/23 NPO after MN NO Solid Food.  Clear liquids from MN until---0430 Med rec completed Medications to take morning of surgery -----Albuterol inhaler prn, Buspar, Cymbalta, Allegra, Neurontin, Ativan, Topamax, Omeprazole, Voltaren gel prn Diabetic medication -----Hold Mounjaro x 7 days before surgery. Last dose 03/28/23. Patient instructed no nail polish to be worn day of surgery Patient instructed to bring photo id and insurance card day of surgery Patient aware to have Driver (ride ) / caregiver    for 24 hours after surgery - husband, Dorinda Hill Patient Special Instructions -----Bring inhaler. Extended / overnight stay instructions given. Pre-Op special Instructions -----none Patient verbalized understanding of instructions that were given at this phone interview. Patient denies shortness of breath, chest pain, fever, cough at this phone interview.  Patient has a hx of chest pain, sob, palpitations, DM2, & HTN. As of 04/03/23, patient states symptoms have improved. Cardiac clearance from cardiologist, Dr. Norman Herrlich dated 04/02/23 in Epic and chart. 03/20/23 CT coronary in Epic 04/01/23 Echo, EF 60-65% in Epic

## 2023-04-06 ENCOUNTER — Encounter (HOSPITAL_COMMUNITY)
Admission: RE | Admit: 2023-04-06 | Discharge: 2023-04-06 | Disposition: A | Discharge status: Home / Self Care | Payer: BC Managed Care – PPO | Source: Ambulatory Visit | Attending: Obstetrics and Gynecology | Admitting: Obstetrics and Gynecology

## 2023-04-06 ENCOUNTER — Other Ambulatory Visit (HOSPITAL_COMMUNITY): Payer: Self-pay

## 2023-04-06 DIAGNOSIS — Z01818 Encounter for other preprocedural examination: Secondary | ICD-10-CM

## 2023-04-06 DIAGNOSIS — N920 Excessive and frequent menstruation with regular cycle: Secondary | ICD-10-CM | POA: Diagnosis not present

## 2023-04-06 DIAGNOSIS — Z01812 Encounter for preprocedural laboratory examination: Secondary | ICD-10-CM | POA: Diagnosis present

## 2023-04-06 LAB — COMPREHENSIVE METABOLIC PANEL
ALT: 18 U/L (ref 0–44)
AST: 23 U/L (ref 15–41)
Albumin: 3.4 g/dL — ABNORMAL LOW (ref 3.5–5.0)
Alkaline Phosphatase: 53 U/L (ref 38–126)
Anion gap: 5 (ref 5–15)
BUN: 15 mg/dL (ref 6–20)
CO2: 23 mmol/L (ref 22–32)
Calcium: 8.5 mg/dL — ABNORMAL LOW (ref 8.9–10.3)
Chloride: 110 mmol/L (ref 98–111)
Creatinine, Ser: 1.05 mg/dL — ABNORMAL HIGH (ref 0.44–1.00)
GFR, Estimated: >60 mL/min (ref >60)
Glucose, Bld: 67 mg/dL — ABNORMAL LOW (ref 70–99)
Potassium: 4.1 mmol/L (ref 3.5–5.1)
Sodium: 138 mmol/L (ref 135–145)
Total Bilirubin: 0.5 mg/dL (ref 0.3–1.2)
Total Protein: 6.5 g/dL (ref 6.5–8.1)

## 2023-04-06 LAB — CBC
HCT: 40.4 % (ref 36.0–46.0)
Hemoglobin: 13 g/dL (ref 12.0–15.0)
MCH: 30.1 pg (ref 26.0–34.0)
MCHC: 32.2 g/dL (ref 30.0–36.0)
MCV: 93.5 fL (ref 80.0–100.0)
Platelets: 371 10*3/uL (ref 150–400)
RBC: 4.32 MIL/uL (ref 3.87–5.11)
RDW: 13.9 % (ref 11.5–15.5)
WBC: 4.2 10*3/uL (ref 4.0–10.5)
nRBC: 0 % (ref 0.0–0.2)

## 2023-04-06 LAB — HIV ANTIBODY (ROUTINE TESTING W REFLEX): HIV Screen 4th Generation wRfx: NONREACTIVE

## 2023-04-06 LAB — TYPE AND SCREEN: ABO/RH(D): A POS

## 2023-04-06 MED ORDER — SPIRONOLACTONE 50 MG PO TABS
50.0000 mg | ORAL_TABLET | Freq: Every day | ORAL | 3 refills | Status: DC
  Filled 2023-04-06: qty 90, 90d supply, fill #0
  Filled 2023-09-28: qty 90, 90d supply, fill #1
  Filled 2023-12-27: qty 90, 90d supply, fill #2

## 2023-04-08 NOTE — H&P (Signed)
Mindy Reed is an 42 y.o. female G2P1 with heavy painful menses. She has a Mirena IUD which has not relieved symptoms. U/S in office 04/06/23 noted uterus 8.8 x 5.3 x 3.7 cm, EM stripe 4.2 mm with IUD in proper location, simple bilateral ovarian follicles of 2.5 cm right and 3.3 cm left. No adnexal mass and no FF. She is complicated by a history of morbid obesity S/P Roux en Y in 2016 and a loss of 100# with Mounjaro in last year. She is S/P ovarian cystectomy through a midline incision @ 42 yo.  Pertinent Gynecological History: Menses: flow is excessive with use of many pads or tampons on heaviest days Bleeding:  Contraception: IUD DES exposure: denies Blood transfusions: none Sexually transmitted diseases: no past history Previous GYN Procedures:  Last mammogram: normal Date: 2024 Last pap: normal Date: 2024 OB History: G2, P1   Menstrual History: Menarche age: unknown Patient's last menstrual period was 03/19/2023 (approximate).    Past Medical History:  Diagnosis Date   Abnormal uterine bleeding (AUB)    Abnormally small mouth    no problems with intubation   Anemia    hx of   Anxiety disorder 07/24/2014   Arthritis    right knee and right ankle   Asthma 04/13/2014   Follow w/ Lonie Peak, PCP.   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   Complication of anesthesia    was hard to wake up after gastric bypass; states "fought them" during induction for D & C   Dental crowns present    Depressive disorder 05/02/2021   Follows w/ PCP and counseling.   Eczema    Elevated platelet count 07/11/2014   Formatting of this note might be different from the original. Elevated Platelet count in pregnancy 493 in 03/2014 repeat was 482, per Dr. Marjo Bicker refer to hematology 07/11/2014 Formatting of this note might be different from the original.  Elevated Platelet count in pregnancy 493 in 03/2014 repeat was 482, per Dr. 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 Helane Rima at [facility].   Palpitations    Follows w/ Dr.  Norman Herrlich, cardiology.   PCOS (polycystic ovarian syndrome)    Pneumonia    no hospitalization, around 2021   Pre-existing hypertension during pregnancy in first trimester 04/13/2014   Seasonal allergies    Seasonal asthma    SOB (shortness of breath)    See cardiology OV from 03/09/23 in Epic. Cardiac work-up was negative. 04/01/23 Echo EF 60-65%, 03/20/23 CT Coronary   Type 2 diabetes mellitus (HCC) 07/24/2014   Follows w/ PCP and [facility].   Wears contact lenses    Wears glasses     Past Surgical History:  Procedure Laterality Date   BREAST BIOPSY Left 2017 nov  desmoid tumor benign    BREAST EXCISIONAL BIOPSY Left    CHOLECYSTECTOMY  2010   DG BARIUM SWALLOW ([facility] HX)  08/27/2015   ESOPHAGOGASTRODUODENOSCOPY (EGD) WITH PROPOFOL N/A 08/17/2017   Procedure: ESOPHAGOGASTRODUODENOSCOPY (EGD) WITH PROPOFOL;  Surgeon: Luretha Murphy, MD;  Location: [facility];  Service: General;  Laterality: N/A;   FLEXIBLE SIGMOIDOSCOPY  as teenager   HIGH RISK BREAST EXCISION     left breast exc bx 2017 desmoid tumor   HYSTEROSCOPY WITH D & C N/A 07/18/2015   Procedure: DILATATION AND CURETTAGE /HYSTEROSCOPY;  Surgeon: Harold Hedge, MD;  Location: [facility];  Service: Gynecology;  Laterality: N/A;   HYSTEROSCOPY  WITH D & C N/A 11/07/2020   Procedure: DILATATION AND CURETTAGE /HYSTEROSCOPY WITH MYOSURE;  Surgeon: Carver Fila, MD;  Location: [facility];  Service: Gynecology;  Laterality: N/A;   INTRAUTERINE DEVICE (IUD) INSERTION N/A 07/18/2015   Procedure: INTRAUTERINE DEVICE (IUD) INSERTION;  Surgeon: Harold Hedge, MD;  Location: [facility];  Service: Gynecology;  Laterality: N/A;   INTRAUTERINE DEVICE (IUD) INSERTION N/A 11/07/2020   Procedure: INTRAUTERINE DEVICE (IUD) INSERTION MIRENA;  Surgeon: Carver Fila, MD;  Location: [facility];  Service: Gynecology;  Laterality: N/A;   LAPAROSCOPIC ROUX-EN-Y GASTRIC BYPASS WITH HIATAL HERNIA REPAIR  08/27/2015   Procedure: LAPAROSCOPIC ROUX-EN-Y GASTRIC BYPASS WITH HIATAL HERNIA REPAIR;  Surgeon: Luretha Murphy, MD;  Location: [facility];  Service: General;;   ORIF ANKLE FRACTURE Right    OVARIAN CYST REMOVAL     RADIOACTIVE SEED GUIDED EXCISIONAL BREAST BIOPSY Left 09/03/2016   Procedure: LEFT RADIOACTIVE SEED GUIDED EXCISIONAL BREAST BIOPSY;  Surgeon: Luretha Murphy, MD;  Location: [facility];  Service: General;  Laterality: Left;   TONSILLECTOMY AND ADENOIDECTOMY     UPPER GI ENDOSCOPY  08/27/2015   Procedure: UPPER GI ENDOSCOPY;  Surgeon: Luretha Murphy, MD;  Location: [facility];  Service: General;;    Family History  Problem Relation Age of Onset   Osteoarthritis Mother    Diabetes Mother    Hypertension Mother    Migraines Mother    Kidney disease Mother    Depression Mother    Anxiety disorder Mother    Sleep apnea Mother    Obesity Mother    Obesity Father    Anxiety disorder Father    Depression Father    Kidney disease Father    Osteoarthritis Father    Diabetes Father    Heart failure Father    Hypertension Father    Heart disease Father    Liver disease Father    Other Maternal Grandmother        thinks may have had uterine cancer - had hysterectomy at age 52   Breast cancer Neg Hx    Ovarian cancer Neg Hx    Colon  cancer Neg Hx     Social History:  reports that she has never smoked. She has never used smokeless tobacco. She reports that she does not drink alcohol and does not use drugs.  Allergies:  Allergies  Allergen Reactions   Hydrochlorothiazide Other (See Comments)    Dropped K+ level to critical value.   Morphine Hives   Adhesive [Tape] Rash    PAPER TAPE IS OKAY   Aspirin Other (See Comments)    HAS HAD GASTRIC BYPASS   Nsaids Other (See Comments)    HAS HAD GASTRIC BYPASS    No medications prior to admission.    Review of Systems  Constitutional:  Negative for fever.  Height 5\' 7"  (1.702 m), weight 113.4 kg, last menstrual period 03/19/2023, not currently breastfeeding. Physical Exam Cardiovascular:     Rate and Rhythm: Normal rate.  Pulmonary:     Effort: Pulmonary effort is normal.  Abdominal:     Comments: Obese with well healed infraumbilical midline scar     No results found for this or any previous visit (from the past 24 hour(s)).  No results found.  Assessment/Plan: 42 yo G2P1 with menorrhagia and dysmenorrhea  LAVH/BS and removal of ovary if abnormal has been discussed. Risks reviewed including including infection, organ damage, bleeding/transfusion-HIV/Hep, DVT/PE, pneumonia, laparotomy, wound breakdown. She states she understands and agrees. Cardiac clearance done  Roselle Locus II 04/08/2023, 5:48 PM

## 2023-04-09 ENCOUNTER — Ambulatory Visit (HOSPITAL_BASED_OUTPATIENT_CLINIC_OR_DEPARTMENT_OTHER): Payer: BC Managed Care – PPO | Admitting: Anesthesiology

## 2023-04-09 ENCOUNTER — Encounter (HOSPITAL_BASED_OUTPATIENT_CLINIC_OR_DEPARTMENT_OTHER)
Admission: RE | Disposition: A | Discharge status: Home / Self Care | Payer: Self-pay | Source: Home / Self Care | Attending: Obstetrics and Gynecology

## 2023-04-09 ENCOUNTER — Observation Stay (HOSPITAL_BASED_OUTPATIENT_CLINIC_OR_DEPARTMENT_OTHER)
Admission: RE | Admit: 2023-04-09 | Discharge: 2023-04-10 | Disposition: A | Discharge status: Home / Self Care | Payer: BC Managed Care – PPO | Attending: Obstetrics and Gynecology | Admitting: Obstetrics and Gynecology

## 2023-04-09 ENCOUNTER — Other Ambulatory Visit: Payer: Self-pay

## 2023-04-09 ENCOUNTER — Encounter (HOSPITAL_BASED_OUTPATIENT_CLINIC_OR_DEPARTMENT_OTHER): Payer: Self-pay | Admitting: Obstetrics and Gynecology

## 2023-04-09 DIAGNOSIS — N920 Excessive and frequent menstruation with regular cycle: Secondary | ICD-10-CM | POA: Diagnosis not present

## 2023-04-09 DIAGNOSIS — N946 Dysmenorrhea, unspecified: Secondary | ICD-10-CM | POA: Diagnosis present

## 2023-04-09 DIAGNOSIS — N83291 Other ovarian cyst, right side: Secondary | ICD-10-CM | POA: Insufficient documentation

## 2023-04-09 DIAGNOSIS — E119 Type 2 diabetes mellitus without complications: Secondary | ICD-10-CM | POA: Diagnosis not present

## 2023-04-09 DIAGNOSIS — I10 Essential (primary) hypertension: Secondary | ICD-10-CM | POA: Insufficient documentation

## 2023-04-09 DIAGNOSIS — Z01818 Encounter for other preprocedural examination: Secondary | ICD-10-CM

## 2023-04-09 DIAGNOSIS — J45909 Unspecified asthma, uncomplicated: Secondary | ICD-10-CM | POA: Insufficient documentation

## 2023-04-09 DIAGNOSIS — N83202 Unspecified ovarian cyst, left side: Secondary | ICD-10-CM | POA: Insufficient documentation

## 2023-04-09 HISTORY — DX: Headache, unspecified: R51.9

## 2023-04-09 HISTORY — DX: Palpitations: R00.2

## 2023-04-09 HISTORY — PX: LAPAROSCOPIC VAGINAL HYSTERECTOMY WITH SALPINGECTOMY: SHX6680

## 2023-04-09 HISTORY — DX: Presence of spectacles and contact lenses: Z97.3

## 2023-04-09 HISTORY — DX: Myoneural disorder, unspecified: G70.9

## 2023-04-09 LAB — TYPE AND SCREEN: Antibody Screen: NEGATIVE

## 2023-04-09 LAB — POCT PREGNANCY, URINE: Preg Test, Ur: NEGATIVE

## 2023-04-09 SURGERY — HYSTERECTOMY, VAGINAL, LAPAROSCOPY-ASSISTED, WITH SALPINGECTOMY
Anesthesia: General | Site: Uterus | Laterality: Bilateral

## 2023-04-09 MED ORDER — PROMETHAZINE HCL 25 MG PO TABS
25.0000 mg | ORAL_TABLET | Freq: Two times a day (BID) | ORAL | Status: DC | PRN
  Administered 2023-04-09: 25 mg via ORAL
  Filled 2023-04-09: qty 1

## 2023-04-09 MED ORDER — FENTANYL CITRATE (PF) 250 MCG/5ML IJ SOLN
INTRAMUSCULAR | Status: DC | PRN
  Administered 2023-04-09 (×2): 50 ug via INTRAVENOUS

## 2023-04-09 MED ORDER — ALUM & MAG HYDROXIDE-SIMETH 200-200-20 MG/5ML PO SUSP: 30.0000 mL | ORAL | Status: DC | PRN

## 2023-04-09 MED ORDER — ONDANSETRON HCL 4 MG/2ML IJ SOLN: 4.0000 mg | Freq: Four times a day (QID) | INTRAMUSCULAR | Status: DC | PRN

## 2023-04-09 MED ORDER — METHOCARBAMOL 500 MG PO TABS
500.0000 mg | ORAL_TABLET | Freq: Four times a day (QID) | ORAL | Status: DC | PRN
  Administered 2023-04-09 – 2023-04-10 (×3): 500 mg via ORAL

## 2023-04-09 MED ORDER — DEXAMETHASONE SODIUM PHOSPHATE 10 MG/ML IJ SOLN
INTRAMUSCULAR | Status: AC
  Filled 2023-04-09: qty 1

## 2023-04-09 MED ORDER — BUPIVACAINE HCL 0.5 % IJ SOLN
INTRAMUSCULAR | Status: DC | PRN
  Administered 2023-04-09: 30 mL

## 2023-04-09 MED ORDER — ACETAMINOPHEN 500 MG PO TABS
1000.0000 mg | ORAL_TABLET | Freq: Four times a day (QID) | ORAL | Status: DC
  Administered 2023-04-09 – 2023-04-10 (×4): 1000 mg via ORAL

## 2023-04-09 MED ORDER — DULOXETINE HCL 60 MG PO CPEP
60.0000 mg | ORAL_CAPSULE | Freq: Every day | ORAL | Status: DC
  Filled 2023-04-09: qty 1

## 2023-04-09 MED ORDER — ALBUTEROL SULFATE HFA 108 (90 BASE) MCG/ACT IN AERS: 1.0000 | INHALATION_SPRAY | RESPIRATORY_TRACT | Status: DC | PRN

## 2023-04-09 MED ORDER — PROPOFOL 10 MG/ML IV BOLUS
INTRAVENOUS | Status: AC
  Filled 2023-04-09: qty 20

## 2023-04-09 MED ORDER — MELOXICAM 15 MG PO TABS: 15.0000 mg | ORAL_TABLET | Freq: Every day | ORAL | Status: DC | PRN

## 2023-04-09 MED ORDER — OXYCODONE HCL 5 MG/5ML PO SOLN: 5.0000 mg | Freq: Once | ORAL | Status: DC | PRN

## 2023-04-09 MED ORDER — ACETAMINOPHEN 500 MG PO TABS
ORAL_TABLET | ORAL | Status: AC
  Filled 2023-04-09: qty 2

## 2023-04-09 MED ORDER — DOCUSATE SODIUM 100 MG PO CAPS
100.0000 mg | ORAL_CAPSULE | Freq: Two times a day (BID) | ORAL | Status: DC
  Administered 2023-04-09 (×2): 100 mg via ORAL

## 2023-04-09 MED ORDER — LACTATED RINGERS IV SOLN: INTRAVENOUS | Status: DC

## 2023-04-09 MED ORDER — METHOCARBAMOL 500 MG PO TABS
ORAL_TABLET | ORAL | Status: AC
  Filled 2023-04-09: qty 1

## 2023-04-09 MED ORDER — ONDANSETRON HCL 4 MG/2ML IJ SOLN
INTRAMUSCULAR | Status: DC | PRN
  Administered 2023-04-09: 4 mg via INTRAVENOUS

## 2023-04-09 MED ORDER — HYDROMORPHONE HCL 1 MG/ML IJ SOLN
INTRAMUSCULAR | Status: DC | PRN
  Administered 2023-04-09 (×2): .5 mg via INTRAVENOUS

## 2023-04-09 MED ORDER — ROCURONIUM BROMIDE 10 MG/ML (PF) SYRINGE
PREFILLED_SYRINGE | INTRAVENOUS | Status: AC
  Filled 2023-04-09: qty 10

## 2023-04-09 MED ORDER — ONDANSETRON HCL 4 MG PO TABS: 4.0000 mg | ORAL_TABLET | Freq: Four times a day (QID) | ORAL | Status: DC | PRN

## 2023-04-09 MED ORDER — DOCUSATE SODIUM 100 MG PO CAPS
ORAL_CAPSULE | ORAL | Status: AC
  Filled 2023-04-09: qty 1

## 2023-04-09 MED ORDER — ONDANSETRON HCL 4 MG/2ML IJ SOLN
INTRAMUSCULAR | Status: AC
  Filled 2023-04-09: qty 2

## 2023-04-09 MED ORDER — OXYCODONE HCL 5 MG PO TABS
ORAL_TABLET | ORAL | Status: AC
  Filled 2023-04-09: qty 1

## 2023-04-09 MED ORDER — PROMETHAZINE HCL 25 MG PO TABS
ORAL_TABLET | ORAL | Status: AC
  Filled 2023-04-09: qty 1

## 2023-04-09 MED ORDER — PROMETHAZINE HCL 25 MG/ML IJ SOLN: 6.2500 mg | INTRAMUSCULAR | Status: DC | PRN

## 2023-04-09 MED ORDER — AMISULPRIDE (ANTIEMETIC) 5 MG/2ML IV SOLN: 10.0000 mg | Freq: Once | INTRAVENOUS | Status: DC | PRN

## 2023-04-09 MED ORDER — SUGAMMADEX SODIUM 200 MG/2ML IV SOLN
INTRAVENOUS | Status: DC | PRN
  Administered 2023-04-09: 250 mg via INTRAVENOUS

## 2023-04-09 MED ORDER — DEXMEDETOMIDINE HCL IN NACL 80 MCG/20ML IV SOLN
INTRAVENOUS | Status: DC | PRN
  Administered 2023-04-09 (×2): 8 ug via INTRAVENOUS

## 2023-04-09 MED ORDER — CEFAZOLIN IN SODIUM CHLORIDE 3-0.9 GM/100ML-% IV SOLN
3.0000 g | INTRAVENOUS | Status: AC
  Administered 2023-04-09: 3 g via INTRAVENOUS

## 2023-04-09 MED ORDER — HYDROMORPHONE HCL 1 MG/ML IJ SOLN: 0.2000 mg | INTRAMUSCULAR | Status: DC | PRN

## 2023-04-09 MED ORDER — GABAPENTIN 300 MG PO CAPS: 300.0000 mg | ORAL_CAPSULE | Freq: Every day | ORAL | Status: DC

## 2023-04-09 MED ORDER — ROCURONIUM BROMIDE 10 MG/ML (PF) SYRINGE
PREFILLED_SYRINGE | INTRAVENOUS | Status: DC | PRN
  Administered 2023-04-09: 50 mg via INTRAVENOUS
  Administered 2023-04-09: 20 mg via INTRAVENOUS
  Administered 2023-04-09: 30 mg via INTRAVENOUS

## 2023-04-09 MED ORDER — DEXMEDETOMIDINE HCL IN NACL 80 MCG/20ML IV SOLN
INTRAVENOUS | Status: AC
  Filled 2023-04-09: qty 20

## 2023-04-09 MED ORDER — 0.9 % SODIUM CHLORIDE (POUR BTL) OPTIME
TOPICAL | Status: DC | PRN
  Administered 2023-04-09: 500 mL

## 2023-04-09 MED ORDER — FENTANYL CITRATE (PF) 100 MCG/2ML IJ SOLN
INTRAMUSCULAR | Status: AC
  Filled 2023-04-09: qty 2

## 2023-04-09 MED ORDER — CEFAZOLIN SODIUM-DEXTROSE 2-4 GM/100ML-% IV SOLN
INTRAVENOUS | Status: AC
  Filled 2023-04-09: qty 100

## 2023-04-09 MED ORDER — SODIUM CHLORIDE 0.9 % IR SOLN
Status: DC | PRN
  Administered 2023-04-09: 1000 mL

## 2023-04-09 MED ORDER — TRAZODONE HCL 100 MG PO TABS
100.0000 mg | ORAL_TABLET | Freq: Every day | ORAL | Status: DC
  Administered 2023-04-09: 100 mg via ORAL
  Filled 2023-04-09: qty 1

## 2023-04-09 MED ORDER — MENTHOL 3 MG MT LOZG: 1.0000 | LOZENGE | OROMUCOSAL | Status: DC | PRN

## 2023-04-09 MED ORDER — BUSPIRONE HCL 5 MG PO TABS
7.5000 mg | ORAL_TABLET | Freq: Two times a day (BID) | ORAL | Status: DC
  Administered 2023-04-09: 7.5 mg via ORAL
  Filled 2023-04-09: qty 1.5

## 2023-04-09 MED ORDER — OXYCODONE HCL 5 MG PO TABS
5.0000 mg | ORAL_TABLET | ORAL | Status: DC | PRN
  Administered 2023-04-09: 5 mg via ORAL
  Administered 2023-04-09: 10 mg via ORAL
  Administered 2023-04-09 (×2): 5 mg via ORAL
  Administered 2023-04-10 (×2): 10 mg via ORAL

## 2023-04-09 MED ORDER — SOD CITRATE-CITRIC ACID 500-334 MG/5ML PO SOLN: 30.0000 mL | ORAL | Status: DC

## 2023-04-09 MED ORDER — HYDROMORPHONE HCL 2 MG/ML IJ SOLN
INTRAMUSCULAR | Status: AC
  Filled 2023-04-09: qty 1

## 2023-04-09 MED ORDER — PROPOFOL 10 MG/ML IV BOLUS
INTRAVENOUS | Status: DC | PRN
  Administered 2023-04-09: 200 mg via INTRAVENOUS

## 2023-04-09 MED ORDER — MEPERIDINE HCL 25 MG/ML IJ SOLN
INTRAMUSCULAR | Status: AC
  Filled 2023-04-09: qty 1

## 2023-04-09 MED ORDER — HEMOSTATIC AGENTS (NO CHARGE) OPTIME
TOPICAL | Status: DC | PRN
  Administered 2023-04-09: 1

## 2023-04-09 MED ORDER — SODIUM CHLORIDE (PF) 0.9 % IJ SOLN
INTRAMUSCULAR | Status: AC
  Filled 2023-04-09: qty 10

## 2023-04-09 MED ORDER — POVIDONE-IODINE 10 % EX SWAB: 2.0000 | Freq: Once | CUTANEOUS | Status: DC

## 2023-04-09 MED ORDER — OXYCODONE HCL 5 MG PO TABS: 5.0000 mg | ORAL_TABLET | Freq: Once | ORAL | Status: DC | PRN

## 2023-04-09 MED ORDER — LIDOCAINE 2% (20 MG/ML) 5 ML SYRINGE
INTRAMUSCULAR | Status: DC | PRN
  Administered 2023-04-09: 100 mg via INTRAVENOUS

## 2023-04-09 MED ORDER — MEPERIDINE HCL 25 MG/ML IJ SOLN
6.2500 mg | INTRAMUSCULAR | Status: DC | PRN
  Administered 2023-04-09: 6.25 mg via INTRAVENOUS

## 2023-04-09 MED ORDER — MIDAZOLAM HCL 2 MG/2ML IJ SOLN
INTRAMUSCULAR | Status: DC | PRN
  Administered 2023-04-09: 2 mg via INTRAVENOUS

## 2023-04-09 MED ORDER — DEXAMETHASONE SODIUM PHOSPHATE 10 MG/ML IJ SOLN
INTRAMUSCULAR | Status: DC | PRN
  Administered 2023-04-09: 10 mg via INTRAVENOUS

## 2023-04-09 MED ORDER — MIDAZOLAM HCL 2 MG/2ML IJ SOLN
INTRAMUSCULAR | Status: AC
  Filled 2023-04-09: qty 2

## 2023-04-09 MED ORDER — OXYCODONE HCL 5 MG PO TABS
ORAL_TABLET | ORAL | Status: AC
  Filled 2023-04-09: qty 2

## 2023-04-09 MED ORDER — HYDROMORPHONE HCL 1 MG/ML IJ SOLN: 0.2500 mg | INTRAMUSCULAR | Status: DC | PRN

## 2023-04-09 MED ORDER — LIDOCAINE HCL (PF) 2 % IJ SOLN
INTRAMUSCULAR | Status: AC
  Filled 2023-04-09: qty 5

## 2023-04-09 MED ORDER — BUPROPION HCL ER (XL) 150 MG PO TB24
150.0000 mg | ORAL_TABLET | Freq: Every day | ORAL | Status: DC
  Administered 2023-04-09: 150 mg via ORAL
  Filled 2023-04-09: qty 1

## 2023-04-09 MED ORDER — MAGNESIUM HYDROXIDE 400 MG/5ML PO SUSP: 30.0000 mL | Freq: Every day | ORAL | Status: DC | PRN

## 2023-04-09 SURGICAL SUPPLY — 84 items
ADH SKN CLS APL DERMABOND .7 (GAUZE/BANDAGES/DRESSINGS) ×2
APL PRP STRL LF DISP 70% ISPRP (MISCELLANEOUS)
APL SKNCLS STERI-STRIP NONHPOA (GAUZE/BANDAGES/DRESSINGS)
APL SRG 38 LTWT LNG FL B (MISCELLANEOUS) ×2
APPLICATOR ARISTA FLEXITIP XL (MISCELLANEOUS) ×1 IMPLANT
BENZOIN TINCTURE PRP APPL 2/3 (GAUZE/BANDAGES/DRESSINGS) ×1 IMPLANT
BLADE CLIPPER SENSICLIP SURGIC (BLADE) IMPLANT
BLADE EXTENDED COATED 6.5IN (ELECTRODE) IMPLANT
CATH ROBINSON RED A/P 16FR (CATHETERS) ×2 IMPLANT
CHLORAPREP W/TINT 26 (MISCELLANEOUS) ×1 IMPLANT
CNTNR URN SCR LID CUP LEK RST (MISCELLANEOUS) ×1 IMPLANT
CONT SPEC 4OZ STRL OR WHT (MISCELLANEOUS) ×2
COVER BACK TABLE 60X90IN (DRAPES) ×2 IMPLANT
COVER MAYO STAND STRL (DRAPES) ×3 IMPLANT
DERMABOND ADVANCED .7 DNX12 (GAUZE/BANDAGES/DRESSINGS) ×2 IMPLANT
DRAPE SURG IRRIG POUCH 19X23 (DRAPES) ×2 IMPLANT
DRAPE WARM FLUID 44X44 (DRAPES) ×1 IMPLANT
DRSG OPSITE POSTOP 4X10 (GAUZE/BANDAGES/DRESSINGS) ×1 IMPLANT
DURAPREP 26ML APPLICATOR (WOUND CARE) ×2 IMPLANT
ELECT REM PT RETURN 9FT ADLT (ELECTROSURGICAL) ×2
ELECTRODE REM PT RTRN 9FT ADLT (ELECTROSURGICAL) ×2 IMPLANT
GAUZE 4X4 16PLY ~~LOC~~+RFID DBL (SPONGE) ×2 IMPLANT
GAUZE PAD ABD 8X10 STRL (GAUZE/BANDAGES/DRESSINGS) ×1 IMPLANT
GAUZE SPONGE 4X4 12PLY STRL LF (GAUZE/BANDAGES/DRESSINGS) ×3 IMPLANT
GAUZE SPONGE 4X4 16PLY NS LF (WOUND CARE) ×1 IMPLANT
GLOVE BIO SURGEON STRL SZ7 (GLOVE) ×4 IMPLANT
GLOVE BIO SURGEON STRL SZ8 (GLOVE) ×8 IMPLANT
GLOVE BIOGEL PI IND STRL 7.0 (GLOVE) ×4 IMPLANT
GLOVE ECLIPSE 6.5 STRL STRAW (GLOVE) ×2 IMPLANT
GLOVE SURG SYN 6.5 ES PF (GLOVE) ×2 IMPLANT
GLOVE SURG SYN 6.5 PF PI (GLOVE) IMPLANT
GOWN STRL REUS W/ TWL LRG LVL3 (GOWN DISPOSABLE) ×3 IMPLANT
GOWN STRL REUS W/TWL LRG LVL3 (GOWN DISPOSABLE) ×9 IMPLANT
GOWN STRL REUS W/TWL XL LVL3 (GOWN DISPOSABLE) ×4 IMPLANT
HEMOSTAT ARISTA ABSORB 3G PWDR (HEMOSTASIS) ×1 IMPLANT
HOLDER FOLEY CATH W/STRAP (MISCELLANEOUS) ×2 IMPLANT
IRRIG SUCT STRYKERFLOW 2 WTIP (MISCELLANEOUS) ×2
IRRIGATION SUCT STRKRFLW 2 WTP (MISCELLANEOUS) ×1 IMPLANT
KIT PINK PAD W/HEAD ARE REST (MISCELLANEOUS) ×2
KIT PINK PAD W/HEAD ARM REST (MISCELLANEOUS) ×3 IMPLANT
KIT TURNOVER CYSTO (KITS) ×2 IMPLANT
LIGASURE IMPACT 36 18CM CVD LR (INSTRUMENTS) ×1 IMPLANT
NDL HYPO 22X1.5 SAFETY MO (MISCELLANEOUS) ×1 IMPLANT
NDL INSUFFLATION 14GA 120MM (NEEDLE) ×1 IMPLANT
NDL INSUFFLATION 14GA 150MM (NEEDLE) IMPLANT
NEEDLE HYPO 22X1.5 SAFETY MO (MISCELLANEOUS) ×2 IMPLANT
NEEDLE INSUFFLATION 14GA 120MM (NEEDLE) ×2 IMPLANT
NEEDLE INSUFFLATION 14GA 150MM (NEEDLE) IMPLANT
NS IRRIG 500ML POUR BTL (IV SOLUTION) ×2 IMPLANT
PACK ABDOMINAL GYN (CUSTOM PROCEDURE TRAY) ×1 IMPLANT
PACK LAVH (CUSTOM PROCEDURE TRAY) ×2 IMPLANT
PAD OB MATERNITY 4.3X12.25 (PERSONAL CARE ITEMS) ×2 IMPLANT
PAD PREP 24X48 CUFFED NSTRL (MISCELLANEOUS) ×2 IMPLANT
SCISSORS LAP 5X35 DISP (ENDOMECHANICALS) IMPLANT
SCISSORS LAP 5X45 EPIX DISP (ENDOMECHANICALS) IMPLANT
SEALER TISSUE G2 CVD JAW 45CM (ENDOMECHANICALS) ×2 IMPLANT
SET TUBE SMOKE EVAC HIGH FLOW (TUBING) ×2 IMPLANT
SLEEVE SCD COMPRESS KNEE MED (STOCKING) ×2 IMPLANT
SLEEVE Z-THREAD 5X100MM (TROCAR) ×1 IMPLANT
SOL ELECTROSURG ANTI STICK (MISCELLANEOUS) ×2
SOLUTION ELECTROSURG ANTI STCK (MISCELLANEOUS) ×1 IMPLANT
SPIKE FLUID TRANSFER (MISCELLANEOUS) IMPLANT
SPONGE T-LAP 18X18 ~~LOC~~+RFID (SPONGE) IMPLANT
SPONGE T-LAP 4X18 ~~LOC~~+RFID (SPONGE) IMPLANT
STRIP CLOSURE SKIN 1/4X4 (GAUZE/BANDAGES/DRESSINGS) IMPLANT
SUT MNCRL 0 1X36 CT-1 (SUTURE) ×1 IMPLANT
SUT MNCRL 0 MO-4 VIOLET 18 CR (SUTURE) ×4 IMPLANT
SUT MNCRL 0 VIOLET 6X18 (SUTURE) ×2 IMPLANT
SUT MON AB-0 CT1 36 (SUTURE) IMPLANT
SUT PDS AB 0 CTX 60 (SUTURE) ×1 IMPLANT
SUT PLAIN 2 0 (SUTURE)
SUT PLAIN ABS 2-0 CT1 27XMFL (SUTURE) ×1 IMPLANT
SUT VIC AB 4-0 KS 27 (SUTURE) ×1 IMPLANT
SUT VIC AB 4-0 PS2 18 (SUTURE) ×3 IMPLANT
SUT VICRYL 0 UR6 27IN ABS (SUTURE) ×3 IMPLANT
SYR 30ML LL (SYRINGE) ×2 IMPLANT
SYR BULB IRRIG 60ML STRL (SYRINGE) ×2 IMPLANT
SYR CONTROL 10ML LL (SYRINGE) ×2 IMPLANT
TOWEL OR 17X24 6PK STRL BLUE (TOWEL DISPOSABLE) ×3 IMPLANT
TRAY FOLEY W/BAG SLVR 14FR LF (SET/KITS/TRAYS/PACK) ×2 IMPLANT
TROCAR Z-THREAD BLADED 5X100MM (TROCAR) ×2 IMPLANT
TROCAR Z-THREAD FIOS 11X100 BL (TROCAR) ×1 IMPLANT
WARMER LAPAROSCOPE (MISCELLANEOUS) ×2 IMPLANT
WATER STERILE IRR 1000ML POUR (IV SOLUTION) ×1 IMPLANT

## 2023-04-09 NOTE — Progress Notes (Signed)
Tolerating regular diet, ambulating, good pain relief Foley out, no void yet  Today's Vitals   04/09/23 1124 04/09/23 1228 04/09/23 1531 04/09/23 1543  BP: (!) 142/79 128/67 129/79   Pulse: 69 71 78   Resp: 18 16 18    Temp: 97.8 F (36.6 C) 98 F (36.7 C) 98.4 F (36.9 C)   TempSrc: Axillary Oral Oral   SpO2: 100% 95% 98%   Weight:      Height:      PainSc: 6    4    Body mass index is 41.4 kg/m.   Abdomen soft, incisions C&D LE PAS on  A/P: stable         Will discharge in am         Discharge instructions reviewed

## 2023-04-09 NOTE — Progress Notes (Signed)
04/09/2023  9:32 AM  PATIENT:  Mindy Reed  42 y.o. female  PRE-OPERATIVE DIAGNOSIS:  MENORRHAGIA  POST-OPERATIVE DIAGNOSIS:  MENORRHAGIA  PROCEDURE:  Procedure(s): LAPAROSCOPIC ASSISTED VAGINAL HYSTERECTOMY WITH SALPINGECTOMY (Bilateral)  SURGEON:  Surgeon(s) and Role:    * Harold Hedge, MD - Primary    * Tawni Levy, MD - Assisting  PHYSICIAN ASSISTANT:   ASSISTANTS:   ANESTHESIA:   general  EBL:  100 mL   BLOOD ADMINISTERED:none  DRAINS: Urinary Catheter (Foley)   LOCAL MEDICATIONS USED:  MARCAINE    and Amount: 30 ml  SPECIMEN:  Source of Specimen:  uterus, bilateral fallopian tubes, biopsy of right ovary, aspirate of left ovarian cyst  DISPOSITION OF SPECIMEN:  PATHOLOGY  COUNTS:  YES  TOURNIQUET:  * No tourniquets in log *  DICTATION: .Other Dictation: Dictation Number [id number]  PLAN OF CARE: Admit for overnight observation  PATIENT DISPOSITION:  PACU - hemodynamically stable.   Delay start of Pharmacological VTE agent (>24hrs) due to surgical blood loss or risk of bleeding: not applicable

## 2023-04-09 NOTE — Progress Notes (Signed)
No change to H&P per patient history Reviewed procedure-LAVH/BS/remove ovary if abnormal/possible TAH/BS Allergies Morphine>hives, adhesives>rash, NSAID>gastric irritation S/P bariatric surgery All questions answered She states she understands and agrees

## 2023-04-09 NOTE — Anesthesia Preprocedure Evaluation (Signed)
Anesthesia Evaluation  Patient identified by MRN, date of birth, ID band Patient awake    Reviewed: Allergy & Precautions, NPO status , Patient's Chart, lab work & pertinent test results  Airway Mallampati: I  TM Distance: >3 FB Neck ROM: Full    Dental no notable dental hx.    Pulmonary asthma    Pulmonary exam normal breath sounds clear to auscultation       Cardiovascular hypertension, Pt. on medications Normal cardiovascular exam Rhythm:Regular Rate:Normal     Neuro/Psych  Headaches PSYCHIATRIC DISORDERS Anxiety Depression       GI/Hepatic Neg liver ROS,GERD  Controlled and Medicated,,  Endo/Other  diabetes  Morbid obesity  Renal/GU negative Renal ROS     Musculoskeletal  (+) Arthritis , Osteoarthritis,    Abdominal  (+) + obese  Peds  (+) ADHD Hematology negative hematology ROS (+)   Anesthesia Other Findings ABNORMAL UTERINE BLEEDING, PELVIC PAIN, CYSTIC PELVIC MASS  Reproductive/Obstetrics                             Anesthesia Physical Anesthesia Plan  ASA: 3  Anesthesia Plan: General   Post-op Pain Management: Dilaudid IV   Induction: Intravenous  PONV Risk Score and Plan: 3 and Ondansetron, Dexamethasone, Midazolam and Treatment may vary due to age or medical condition  Airway Management Planned: Oral ETT  Additional Equipment:   Intra-op Plan:   Post-operative Plan: Extubation in OR  Informed Consent: I have reviewed the patients History and Physical, chart, labs and discussed the procedure including the risks, benefits and alternatives for the proposed anesthesia with the patient or authorized representative who has indicated his/her understanding and acceptance.     Dental advisory given  Plan Discussed with: CRNA  Anesthesia Plan Comments:         Anesthesia Quick Evaluation

## 2023-04-09 NOTE — Anesthesia Postprocedure Evaluation (Signed)
Anesthesia Post Note  Patient: Mindy Reed  Procedure(s) Performed: LAPAROSCOPIC ASSISTED VAGINAL HYSTERECTOMY WITH SALPINGECTOMY (Bilateral: Uterus)     Patient location during evaluation: PACU Anesthesia Type: General Level of consciousness: awake and alert Pain management: pain level controlled Vital Signs Assessment: post-procedure vital signs reviewed and stable Respiratory status: spontaneous breathing, nonlabored ventilation and respiratory function stable Cardiovascular status: blood pressure returned to baseline and stable Postop Assessment: no apparent nausea or vomiting Anesthetic complications: no   No notable events documented.  Last Vitals:  Vitals:   04/09/23 1105 04/09/23 1124  BP: 137/77 (!) 142/79  Pulse: 89 69  Resp:  18  Temp: 36.7 C 36.6 C  SpO2: 100% 100%    Last Pain:  Vitals:   04/09/23 1124  TempSrc: Axillary  PainSc: 6                  Lowella Curb

## 2023-04-09 NOTE — Op Note (Unsigned)
Mindy Reed, Mindy Reed MEDICAL RECORD NO: [id number] ACCOUNT NO: [id number] DATE OF BIRTH: 01-May-1981 FACILITY: [facility] LOCATION: [facility] PHYSICIAN: Guy Sandifer. Arleta Creek, MD  Operative Report   DATE OF PROCEDURE: 04/09/2023  PREOPERATIVE DIAGNOSES: 1.  Menorrhagia. 2.  Dysmenorrhea.  POSTOPERATIVE DIAGNOSES:   1.  Menorrhagia. 2.  Dysmenorrhea. 3.  Right paraovarian cyst. 4.  Left ovarian cyst.  SURGEON:  Guy Sandifer. Arleta Creek, MD  ASSISTANT:  Jule Economy, M.D.  ANESTHESIA:  General with endotracheal intubation.  ESTIMATED BLOOD LOSS:  100 mL.  SPECIMENS:  Uterus, bilateral fallopian tubes.  Biopsy of right ovarian and aspirate of left ovarian cyst to pathology.  INDICATIONS AND CONSENT: This patient is a 42 year old patient with heavy painful menses.  Details are dictated in the history and physical.  Laparoscopically assisted vaginal hysterectomy with bilateral salpingectomy and removal of an ovary only if  abnormal as well as possible total abdominal hysterectomy with bilateral salpingectomy has been discussed.  Potential risks and complications are discussed preoperatively including, but not limited to infection, organ damage, bleeding requiring  transfusion of blood products with HIV and hepatitis acquisition, DVT, PE, pneumonia, wound breakdown.  The patient states she understands and agrees and consent is signed on the chart.  FINDINGS:  There are some omental adhesions superior to the level of the umbilicus.  The uterus is 8 weeks in size.  The right fallopian tube has a hydrosalpinx and a 1 cm paraovarian cyst is also noted on the right side.  Left fallopian tube was normal.   Left ovary has a 2-1/2 cm smooth translucent cyst.  DESCRIPTION OF PROCEDURE:  The patient was taken to the operating room where she was identified, placed in the dorsal supine position and general anesthesia was induced via endotracheal intubation.  It should be noted she was placed in the dorsal   lithotomy position prior to anesthesia to assure there is no strain on her joints.  A timeout was then done.  She was then prepped abdominally with ChloraPrep, vaginally with Hibiclens.  Straight catheterized.  Her IUD is removed intact without  difficulty.  Hulka tenaculum was placed in the uterus as a manipulator.  After 3 minute drying time, she was draped in a sterile fashion.  The infraumbilical and suprapubic areas were injected with approximately 10 mL total of 0.5% plain Marcaine.  An  infraumbilical incision was made.  The anterior fascia was then entered and tagged at the 3 and 9 o'clock positions with 0 Vicryl sutures and these were held.  Further dissection successfully entered into the peritoneal cavity without difficulty.  A  disposable Hasson trocar sleeve was then placed and anchored down with the sutures.  Pneumoperitoneum is created and thorough inspection reveals no damage to surrounding structures, and the above findings.  A small suprapubic incision was made in the  midline and a 5 mm disposable trocar sleeve was placed under direct visualization without difficulty.  After noting the above findings, the right paraovarian cyst is then resected with the EnSeal bipolar cutter cutting instrument.  This was retrieved and  sent for pathology.  The right fallopian tube was then taken down along its entire length.  Proximally, the pedicles were taken down first the uteroovarian then the round ligament.  This was carried down to the level of vesicouterine peritoneum.  On the  left side, the fallopian tube was taken down along its length.  The proximal pedicles were taken down in a similar fashion down to the level of vesicouterine  peritoneum.  Vesicouterine peritoneum was then taken down bilaterally.  Good hemostasis was  noted.  Instruments were removed.  Suprapubic trocar sleeve was removed and attention was turned to the vagina.  Posterior cul-de-sacs were entered sharply and the cervix was  circumscribed with unipolar cautery.  Mucosa was advanced sharply and bluntly.   Then, using the LigaSure bipolar cautery cutting instrument, the uterosacral ligaments were taken down followed by the bladder pillars, cardinal ligaments and uterine vessels.  The anterior cul-de-sac was successfully entered without difficulty.  After  taking down the remaining pedicles, the fundus was delivered posteriorly and the specimen was delivered without difficulty.  All sutures will be 0 Monocryl unless otherwise designated.  Uterosacral ligaments were plicated in the midline.  Third suture  was used to plicate the peritoneum at the 6 o'clock position.  Uterosacral ligaments were then plicated in the midline with a separate suture.  Cuff was closed with figure-of-eights.  Foley catheter was placed in the bladder and clear urine is noted.   Returned to the abdomen, pneumoperitoneum is reintroduced and the suprapubic trocar sleeve was reintroduced under direct visualization.  Lavage was carried out.  Minor oozing at the peritoneal edges was controlled with bipolar cautery.  Inspection and  reduced pneumoperitoneum reveals excellent hemostasis.  The remaining approximately 20 mL of 0.5% plain Marcaine is instilled into the peritoneal cavity and Arista was then placed over the cuff and ovarian pedicles.  It should also be noted that the left  ovarian cyst was aspirated for clear straw colored fluid that was sent for cytology and good hemostasis was noted there as well.  Suprapubic trocar sleeve was removed.  Pneumoperitoneum was reduced and the umbilical trocar sleeve was removed.  The  anterior fascia on the infraumbilical incision is carefully closed under good visualization along its entire length.  Inspection reveals excellent closure.  Skin on both incisions was closed with a 4-0 Vicryl mattress sutures.  Dermabond was placed on  both.  All counts were correct.  The patient was awakened and taken to recovery room in  stable condition.   PUS D: 04/09/2023 9:42:01 am T: 04/09/2023 2:10:00 pm  JOB: [id number]/ [id number]

## 2023-04-09 NOTE — Anesthesia Procedure Notes (Signed)
Procedure Name: Intubation Date/Time: 04/09/2023 7:27 AM  Performed by: Dairl Ponder, CRNAPre-anesthesia Checklist: Patient identified, Emergency Drugs available, Suction available and Patient being monitored Patient Re-evaluated:Patient Re-evaluated prior to induction Oxygen Delivery Method: Circle System Utilized Preoxygenation: Pre-oxygenation with 100% oxygen Induction Type: IV induction Ventilation: Mask ventilation without difficulty Laryngoscope Size: Mac and 3 Grade View: Grade I Tube type: Oral Tube size: 7.0 mm Number of attempts: 1 Airway Equipment and Method: Stylet and Oral airway Placement Confirmation: ETT inserted through vocal cords under direct vision, positive ETCO2 and breath sounds checked- equal and bilateral Secured at: 22 cm Tube secured with: Tape Dental Injury: Teeth and Oropharynx as per pre-operative assessment

## 2023-04-09 NOTE — Transfer of Care (Signed)
Immediate Anesthesia Transfer of Care Note  Patient: Mindy Reed  Procedure(s) Performed: LAPAROSCOPIC ASSISTED VAGINAL HYSTERECTOMY WITH SALPINGECTOMY (Bilateral: Uterus)  Patient Location: PACU  Anesthesia Type:General  Level of Consciousness: sedated  Airway & Oxygen Therapy: Patient Spontanous Breathing and Patient connected to face mask oxygen  Post-op Assessment: Report given to RN and Post -op Vital signs reviewed and stable  Post vital signs: Reviewed and stable  Last Vitals:  Vitals Value Taken Time  BP 146/81 04/09/23 0945  Temp 36.4 C 04/09/23 0941  Pulse 78 04/09/23 0949  Resp 13 04/09/23 0949  SpO2 100 % 04/09/23 0949  Vitals shown include unvalidated device data.  Last Pain:  Vitals:   04/09/23 0545  TempSrc: Oral         Complications: No notable events documented.

## 2023-04-09 NOTE — Op Note (Signed)
Reed, Mindy MEDICAL RECORD NO: [id number] ACCOUNT NO: [id number] DATE OF BIRTH: January 10, 1981 FACILITY: [facility] LOCATION: [facility] PHYSICIAN: Guy Sandifer. Arleta Creek, MD  Operative Report   DATE OF PROCEDURE: 04/09/2023  [facility], [facility], [place], [place]   PREOPERATIVE DIAGNOSES:  1.  Menorrhagia. 2.  Dysmenorrhea.  POSTOPERATIVE DIAGNOSES:  1.  Menorrhagia. 2.  Dysmenorrhea. 3.  Right paraovarian cyst. 4.  Left ovarian cyst.  PROCEDURE; Laparoscopically Assisted Vaginal Hysterectomy with Bilateral Salpingectomy, Biopsy of Right Ovary and Aspiration of Left Ovarian Cyst  SURGEON:  Fayrene Fearing E. Arleta Creek, MD  ASSISTANT:  Jule Economy, MD  ANESTHESIA:  General with endotracheal intubation.  ESTIMATED BLOOD LOSS:  100 mL  SPECIMENS:  Uterus, bilateral fallopian tubes, biopsy of right ovary and aspirant of left ovarian cyst to pathology.  INDICATIONS AND CONSENT:  This patient is a 42 year old patient with heavy painful menses.  Details are dictated in history and physical.  Laparoscopically-assisted vaginal hysterectomy with bilateral salpingectomy and removal of an ovary only if  abnormal as well as possible total abdominal hysterectomy with bilateral salpingectomy has been discussed.  Potential risks and complications are discussed preoperatively including, but not limited to infection, organ damage, bleeding requiring  transfusion of blood products with HIV and hepatitis acquisition, DVT, PE, pneumonia, wound breakdown.  The patient states she understands and agrees and consent is signed on the chart.  FINDINGS:  There are some omental adhesions superior to the level of the umbilicus.  The uterus is 8 weeks in size.  The right fallopian tube has a hydrosalpinx and a 1 cm paraovarian cyst is also noted on the right side.  Left fallopian tube is normal.   The left ovary has a 2.5 cm smooth translucent cyst.    DESCRIPTION OF PROCEDURE:  The  patient was taken to the operating room where she was identified, placed in the dorsal supine position and general anesthesia was induced via endotracheal intubation.  It should be noted she was placed in the dorsal  lithotomy position prior to anesthesia to assure there is no strain on her joints.  Timeout has been done. She has been prepped abdominally with ChloraPrep, vaginally with Hibiclens, straight catheterized.  Her IUD is removed intact without difficulty.   Hulka tenaculum was placed in the uterus as a manipulator.  After 3-minute drying time, she was draped in a sterile fashion.  The infraumbilical and suprapubic areas were injected with approximately 10 mL total for 0.5% plain Marcaine.  An infraumbilical  incision was made.  The anterior fascia is then entered and tagged at 3 and 9 o'clock positions with 0 Vicryl sutures and these are held.  Further dissection successfully enters the peritoneal cavity without difficulty.  Disposable Hasson trocar sleeve  is then placed and anchored down with the sutures.  Pneumoperitoneum was recreated and thorough inspection reveals no damage to surrounding structures and the above findings.  A small suprapubic incision is made in the midline and a 5 mm disposable  trocar sleeve is placed under direct visualization without difficulty.  After noting the above findings, the right paraovarian cyst is then resected with the EnSeal bipolar cautery cutting instrument.  This is retrieved and sent for pathology.  The right  fallopian tube is then taken down along its entire length.  Proximally, the pedicles are taken down, first the utero-ovarian, then the round ligament.  This is carried down to the level of vesicouterine peritoneum.  On the left side, the  fallopian tube  is taken down along its length.  The proximal pedicles are taken down in the similar fashion down to the level of the vesicouterine peritoneum.  Vesicouterine peritoneum is then taken down  bilaterally.  Good hemostasis is noted.  Instruments were  removed.  Suprapubic trocar sleeve was removed and attention is turned to the vagina.  Posterior cul-de-sac is entered sharply and the cervix is circumscribed with unipolar cautery and the mucosa was advanced sharply and bluntly.  Then, using the  LigaSure bipolar cautery cutting instrument, the uterosacral ligaments are taken down followed by the bladder pillars, cardinal ligaments and the uterine vessels.  The anterior cul-de-sac was successfully entered without difficulty.  After taking down the  remaining pedicles, the fundus is delivered posteriorly and specimen is delivered without difficulty.  All sutures will be 0 Monocryl unless otherwise designated.  The uterosacral ligaments were plicated to the vaginal cuff. In the midline a third suture was used to  plicate the peritoneum at the 6 o'clock position.  Uterosacral ligaments then plicated in the midline with a separate suture.  Cuff is closed with figure-of-eights.  Foley catheter was placed in the bladder and clear urine is noted.  Returning the  abdomen, pneumoperitoneum is reintroduced and the suprapubic trocar sleeve is reintroduced under direct visualization.  Lavage is carried out.  Minor oozing at peritoneal edges is controlled with bipolar cautery.  Inspection on reduced pneumoperitoneum  reveals excellent hemostasis.  The remaining approximately 20 mL of 0.5% plain Marcaine is instilled in the peritoneal cavity and Arista is then placed over the cuff and ovarian pedicles.  It should also be noted that the left ovarian cyst was aspirated  for clear straw-colored fluid that was sent for cytology and good hemostasis was noted there as well.  Suprapubic trocar sleeve is removed and pneumoperitoneum is reduced and the umbilical trocar sleeve is removed.  The anterior fascia on the  infraumbilical incision is carefully closed under good visualization along its entire length.  Inspection  reveals excellent closure.  Skin on both incisions was closed with 4-0 Vicryl mattress sutures.  Dermabond was placed on both.  All counts were  correct.  The patient was awakened, taken to the recovery room in stable condition.             PAA D: 04/09/2023 9:42:01 am T: 04/09/2023 12:33:00 pm  JOB: [id number]/ [id number]

## 2023-04-10 ENCOUNTER — Encounter (HOSPITAL_BASED_OUTPATIENT_CLINIC_OR_DEPARTMENT_OTHER): Payer: Self-pay | Admitting: Obstetrics and Gynecology

## 2023-04-10 DIAGNOSIS — N946 Dysmenorrhea, unspecified: Secondary | ICD-10-CM | POA: Diagnosis not present

## 2023-04-10 LAB — CBC
HCT: 31.8 % — ABNORMAL LOW (ref 36.0–46.0)
Hemoglobin: 10.4 g/dL — ABNORMAL LOW (ref 12.0–15.0)
MCH: 30.2 pg (ref 26.0–34.0)
MCHC: 32.7 g/dL (ref 30.0–36.0)
MCV: 92.4 fL (ref 80.0–100.0)
Platelets: 319 10*3/uL (ref 150–400)
RBC: 3.44 MIL/uL — ABNORMAL LOW (ref 3.87–5.11)
RDW: 13.7 % (ref 11.5–15.5)
WBC: 7.7 10*3/uL (ref 4.0–10.5)
nRBC: 0 % (ref 0.0–0.2)

## 2023-04-10 LAB — SURGICAL PATHOLOGY

## 2023-04-10 MED ORDER — OXYCODONE HCL 5 MG PO TABS
ORAL_TABLET | ORAL | Status: AC
  Filled 2023-04-10: qty 2

## 2023-04-10 MED ORDER — METHOCARBAMOL 500 MG PO TABS
ORAL_TABLET | ORAL | Status: AC
  Filled 2023-04-10: qty 1

## 2023-04-10 MED ORDER — ACETAMINOPHEN 500 MG PO TABS
ORAL_TABLET | ORAL | Status: AC
  Filled 2023-04-10: qty 2

## 2023-04-10 MED ORDER — OXYCODONE HCL 5 MG PO TABS: 5.0000 mg | ORAL_TABLET | ORAL | 0 refills | Status: DC | PRN

## 2023-04-10 MED ORDER — METHOCARBAMOL 500 MG PO TABS: 500.0000 mg | ORAL_TABLET | Freq: Four times a day (QID) | ORAL | 0 refills | Status: DC | PRN

## 2023-04-10 NOTE — Discharge Summary (Signed)
Physician Discharge Summary  Patient ID: Mindy Reed MRN: [id number] DOB/AGE: [birth year] 42 y.o.  Admit date: 04/09/2023 Discharge date: 04/10/2023  Admission Diagnoses: Menorrhagia  Discharge Diagnoses:  Principal Problem:   Menorrhagia   Discharged Condition: good  Hospital Course: in OR had LAVH/BS/biopsy right ovary/aspiration left ovary  Consults: None  Significant Diagnostic Studies: labs:  Results for orders placed or performed during the hospital encounter of 04/09/23 (from the past 24 hour(s))  CBC     Status: Abnormal   Collection Time: 04/10/23  1:58 AM  Result Value Ref Range   WBC 7.7 4.0 - 10.5 K/uL   RBC 3.44 (L) 3.87 - 5.11 MIL/uL   Hemoglobin 10.4 (L) 12.0 - 15.0 g/dL   HCT 40.3 (L) 47.4 - 25.9 %   MCV 92.4 80.0 - 100.0 fL   MCH 30.2 26.0 - 34.0 pg   MCHC 32.7 30.0 - 36.0 g/dL   RDW 56.3 87.5 - 64.3 %   Platelets 319 150 - 400 K/uL   nRBC 0.0 0.0 - 0.2 %     Treatments: surgery: LAVH/BS/biopsy right ovary/aspiration of left ovarian cyst  Discharge Exam: Blood pressure (!) 100/53, pulse 69, temperature 98.2 F (36.8 C), resp. rate 14, height 5\' 7"  (1.702 m), weight 119.9 kg, last menstrual period 03/19/2023, SpO2 100 %, not currently breastfeeding. General appearance: alert, cooperative, and no distress GI: soft, non-tender; bowel sounds normal; no masses,  no organomegaly  Disposition: Discharge disposition: 01-Home or Self Care        Allergies as of 04/10/2023       Reactions   Hydrochlorothiazide Other (See Comments)   Dropped K+ level to critical value.   Morphine Hives   Adhesive [tape] Rash   PAPER TAPE IS OKAY   Aspirin Other (See Comments)   HAS HAD GASTRIC BYPASS   Nsaids Other (See Comments)   HAS HAD GASTRIC BYPASS        Medication List     STOP taking these medications    clindamycin 300 MG capsule Commonly known as: CLEOCIN   rifampin 300 MG capsule Commonly known as: RIFADIN       TAKE these medications     acetaminophen 500 MG tablet Commonly known as: TYLENOL Take 1,000 mg by mouth every 6 (six) hours as needed for moderate pain or headache.   albuterol 108 (90 Base) MCG/ACT inhaler Commonly known as: VENTOLIN HFA Inhale 1-2 puffs into the lungs every 4 (four) hours as needed for wheezing or shortness of breath.   buPROPion 150 MG 24 hr tablet Commonly known as: WELLBUTRIN XL Take 150 mg by mouth daily.   busPIRone 7.5 MG tablet Commonly known as: BUSPAR Take 7.5 mg by mouth 2 (two) times daily.   CALCIUM CITRATE PO Take 500 mg by mouth daily.   DULoxetine 60 MG capsule Commonly known as: CYMBALTA Take 60 mg by mouth daily.   fexofenadine 180 MG tablet Commonly known as: ALLEGRA Take 180 mg by mouth daily.   gabapentin 300 MG capsule Commonly known as: NEURONTIN Take 300 mg by mouth daily.   Linzess 290 MCG Caps capsule Generic drug: linaclotide Take 290 mcg by mouth daily before breakfast.   lisdexamfetamine 40 MG capsule Commonly known as: VYVANSE Take 1 capsule by mouth once daily in the morning   LORazepam 0.5 MG tablet Commonly known as: ATIVAN Take 0.25 mg by mouth 2 (two) times daily as needed for anxiety.   meloxicam 15 MG tablet Commonly known as: MOBIC Take  1 tablet (15 mg total) by mouth daily as needed for pain.   methocarbamol 500 MG tablet Commonly known as: ROBAXIN Take 1 tablet (500 mg total) by mouth every 6 (six) hours as needed for muscle spasms (moderate postop pain).   Mounjaro 10 MG/0.5ML Pen Generic drug: tirzepatide Inject 10 mg into the skin once a week.   Mounjaro 10 MG/0.5ML Pen Generic drug: tirzepatide Inject 10mg  under the skin once weekly. (Sending multiple doses d/t shortage) 30 days   Mounjaro 12.5 MG/0.5ML Pen Generic drug: tirzepatide Inject 12.5mg  under the skin once weekly. (Sending multiple doses d/t shortage) 30 days   Mounjaro 15 MG/0.5ML Pen Generic drug: tirzepatide Inject 15mg  under the skin once weekly.  (Sending multiple doses d/t shortage) 30 days   MULTIVITAMIN & MINERAL PO Take 1 tablet by mouth 2 (two) times daily.   omeprazole 20 MG capsule Commonly known as: PRILOSEC Take 20 mg by mouth daily.   oxyCODONE 5 MG immediate release tablet Commonly known as: Oxy IR/ROXICODONE Take 1 tablet (5 mg total) by mouth every 4 (four) hours as needed for moderate pain.   pravastatin 20 MG tablet Commonly known as: PRAVACHOL Take 1 tablet (20 mg total) by mouth every evening.   promethazine 25 MG tablet Commonly known as: PHENERGAN Take 25 mg by mouth 2 (two) times daily as needed for nausea.   spironolactone 50 MG tablet Commonly known as: ALDACTONE Take 1 tablet (50 mg total) by mouth daily.   tiZANidine 4 MG capsule Commonly known as: ZANAFLEX Take 4 mg by mouth at bedtime.   topiramate 50 MG tablet Commonly known as: TOPAMAX Take 50 mg by mouth daily.   traZODone 100 MG tablet Commonly known as: DESYREL Take 1 tablet (100 mg total) by mouth at bedtime.   vitamin C 100 MG tablet Take 100 mg by mouth daily.   VITAMIN E PO Take 100 Units by mouth daily.   Voltaren 1 % Gel Generic drug: diclofenac Sodium Apply topically 4 (four) times daily.         Signed: Roselle Locus II 04/10/2023, 6:47 AM

## 2023-04-10 NOTE — Progress Notes (Signed)
Ambulating well, good pain relief, tolerating regular diet, feels good  Today's Vitals   04/09/23 2329 04/10/23 0200 04/10/23 0330 04/10/23 0600  BP:  (!) 101/50  (!) 100/53  Pulse:  63  69  Resp:  18  14  Temp:  98.1 F (36.7 C)  98.2 F (36.8 C)  TempSrc:      SpO2:  96%  100%  Weight:      Height:      PainSc: 4  3  3  4     Body mass index is 41.4 kg/m.  A/P: D/C home         Tylenol prn          Methocarbamal prn          Oxycodone prn          FU office 2 weeks

## 2023-04-13 LAB — CYTOLOGY - NON PAP

## 2023-04-14 DIAGNOSIS — G43009 Migraine without aura, not intractable, without status migrainosus: Secondary | ICD-10-CM | POA: Insufficient documentation

## 2023-04-14 DIAGNOSIS — E8889 Other specified metabolic disorders: Secondary | ICD-10-CM | POA: Insufficient documentation

## 2023-04-14 DIAGNOSIS — Z6841 Body Mass Index (BMI) 40.0 and over, adult: Secondary | ICD-10-CM | POA: Insufficient documentation

## 2023-04-14 DIAGNOSIS — F50819 Binge eating disorder, unspecified: Secondary | ICD-10-CM | POA: Insufficient documentation

## 2023-04-14 DIAGNOSIS — F5081 Binge eating disorder: Secondary | ICD-10-CM | POA: Insufficient documentation

## 2023-04-14 DIAGNOSIS — R1031 Right lower quadrant pain: Secondary | ICD-10-CM | POA: Insufficient documentation

## 2023-04-14 DIAGNOSIS — M25571 Pain in right ankle and joints of right foot: Secondary | ICD-10-CM | POA: Insufficient documentation

## 2023-04-14 DIAGNOSIS — K5903 Drug induced constipation: Secondary | ICD-10-CM | POA: Insufficient documentation

## 2023-04-15 NOTE — Progress Notes (Unsigned)
Cardiology Office Note:    Date:  04/16/2023   ID:  Mindy Reed, DOB [birth year], MRN [id number]  PCP:  Lonie Peak, PA-C  Cardiologist:  Norman Herrlich, MD    Referring MD: Lonie Peak, PA-C    ASSESSMENT:    1. SOB (shortness of breath)   2. Benign essential hypertension   3. Hypokalemia   4. Tachycardia    PLAN:    In order of problems listed above:  Improved I think the predominant problem here is related to her anemia and she is on iron supplementation Stable hypertension continue her potassium sparing diuretic spironolactone I asked her to discontinue Zanaflex She is elected to continue with statin   Next appointment: I will plan to see back in the office as needed   Medication Adjustments/Labs and Tests Ordered: Current medicines are reviewed at length with the patient today.  Concerns regarding medicines are outlined above.  No orders of the defined types were placed in this encounter.  No orders of the defined types were placed in this encounter.    History of Present Illness:    Mindy Reed is a 42 y.o. female with a hx of previous bariatric surgery, ADDH with stimulant therapy hypokalemia and abnormal renal function with thiazide therapy discontinued abnormal EKG with ST segment abnormality with hypokalemia hypertension and exertional shortness of breath last seen 03/09/2023.  Compliance with diet, lifestyle and medications: Yes  She had an echocardiogram performed 04/01/2023.  Left ventricle normal in size ejection fraction 60 to 65% and normal GLS.  Right ventricle normal size and function and pulmonary artery pressure there is no valvular abnormality  She had a cardiac CTA reported 03/20/2023 with a calcium score of 0 plaque volume 7 mm 54th percentile for age and normal coronary artery without stenotic lesion.  I reviewed the results of her cardiac studies with her she is quite reassured. She had a laparoscopic hysterectomy a week ago and is  mildly anemic She tells me her heart heart spikes but the heart rates are less than 100 bpm she is back on her Vynase and she has been taking Zanaflex for postoperative pain that could cause reflex tachycardia and asked her to stop No edema shortness of breath chest pain palpitation or syncope  She asked me to look at her EKGs there is one that was scanned in on the 25th but is an EKG from March with ST abnormality when she was hypokalemic on her subsequent EKG in my office had normalized She takes distilled diuretic spironolactone her potassium is normal and is no longer on potassium supplement Past Medical History:  Diagnosis Date   Abnormal uterine bleeding (AUB)    Abnormally small mouth    no problems with intubation   Anemia    hx of   Anxiety disorder 07/24/2014   Arthritis    right knee and right ankle   Asthma 04/13/2014   Follow w/ Lonie Peak, PCP.   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   Complication of anesthesia    was hard to wake up after gastric bypass; states "fought them" during induction for D & C   Dental crowns present    Depressive disorder 05/02/2021   Follows w/ PCP and counseling.   Eczema    Elevated platelet count 07/11/2014   Formatting of this note might be different from the  original. Elevated Platelet count in pregnancy 493 in 03/2014 repeat was 482, per Dr. Marjo Bicker refer to hematology 07/11/2014 Formatting of this note might be different from the original. Elevated Platelet count in pregnancy 493 in 03/2014 repeat was 482, per Dr. 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 Helane Rima at [facility].   Palpitations    Follows w/ Dr.  Norman Herrlich, cardiology.   PCOS (polycystic ovarian syndrome)    Pneumonia    no hospitalization, around 2021   Pre-existing hypertension during pregnancy in first trimester 04/13/2014   Seasonal allergies    Seasonal asthma    SOB (shortness of breath)    See cardiology OV from 03/09/23 in Epic. Cardiac work-up was negative. 04/01/23 Echo EF 60-65%, 03/20/23 CT Coronary   Type 2 diabetes mellitus (HCC) 07/24/2014   Follows w/ PCP and [facility].   Wears contact lenses    Wears glasses     Past Surgical History:  Procedure Laterality Date   BREAST BIOPSY Left 2017 nov   desmoid tumor benign    BREAST EXCISIONAL BIOPSY Left    CHOLECYSTECTOMY  2010   DG BARIUM SWALLOW ([facility] HX)  08/27/2015   ESOPHAGOGASTRODUODENOSCOPY (EGD) WITH PROPOFOL N/A 08/17/2017   Procedure: ESOPHAGOGASTRODUODENOSCOPY (EGD) WITH PROPOFOL;  Surgeon: Luretha Murphy, MD;  Location: [facility];  Service: General;  Laterality: N/A;   FLEXIBLE SIGMOIDOSCOPY  as teenager   HIGH RISK BREAST EXCISION     left breast exc bx 2017 desmoid tumor   HYSTEROSCOPY WITH D & C N/A 07/18/2015   Procedure: DILATATION AND CURETTAGE /HYSTEROSCOPY;  Surgeon: Harold Hedge, MD;  Location: [facility];  Service: Gynecology;  Laterality: N/A;   HYSTEROSCOPY WITH D & C N/A 11/07/2020   Procedure: DILATATION AND CURETTAGE /HYSTEROSCOPY WITH MYOSURE;  Surgeon: Carver Fila, MD;  Location: [facility];  Service: Gynecology;  Laterality: N/A;   INTRAUTERINE DEVICE (IUD) INSERTION N/A 07/18/2015   Procedure: INTRAUTERINE DEVICE (IUD) INSERTION;  Surgeon: Harold Hedge, MD;  Location: [facility];  Service:  Gynecology;  Laterality: N/A;   INTRAUTERINE DEVICE (IUD) INSERTION N/A 11/07/2020   Procedure: INTRAUTERINE DEVICE (IUD) INSERTION MIRENA;  Surgeon: Carver Fila, MD;  Location: [facility];  Service: Gynecology;  Laterality: N/A;   LAPAROSCOPIC ROUX-EN-Y GASTRIC BYPASS WITH HIATAL HERNIA REPAIR  08/27/2015   Procedure: LAPAROSCOPIC ROUX-EN-Y GASTRIC BYPASS WITH HIATAL HERNIA REPAIR;  Surgeon: Luretha Murphy, MD;  Location: [facility];  Service: General;;   LAPAROSCOPIC VAGINAL HYSTERECTOMY WITH SALPINGECTOMY Bilateral 04/09/2023   Procedure: LAPAROSCOPIC ASSISTED VAGINAL HYSTERECTOMY WITH SALPINGECTOMY;  Surgeon: Harold Hedge, MD;  Location: [facility];  Service: Gynecology;  Laterality: Bilateral;   ORIF ANKLE FRACTURE Right    OVARIAN CYST REMOVAL     RADIOACTIVE SEED GUIDED EXCISIONAL BREAST BIOPSY Left 09/03/2016   Procedure: LEFT RADIOACTIVE SEED GUIDED EXCISIONAL BREAST BIOPSY;  Surgeon: Luretha Murphy, MD;  Location: [facility];  Service: General;  Laterality: Left;   TONSILLECTOMY AND ADENOIDECTOMY     UPPER GI ENDOSCOPY  08/27/2015   Procedure: UPPER GI ENDOSCOPY;  Surgeon: Luretha Murphy, MD;  Location: [facility];  Service: General;;    Current Medications: Current Meds  Medication Sig   acetaminophen (TYLENOL) 500 MG tablet Take 1,000 mg by mouth every 6 (six) hours as needed for moderate pain or headache.   albuterol (VENTOLIN HFA) 108 (90 Base) MCG/ACT inhaler Inhale 1-2 puffs into the lungs every 4 (four) hours as needed for wheezing or shortness of breath.   Ascorbic Acid (VITAMIN C) 100 MG tablet Take 100 mg by mouth daily.   buPROPion (WELLBUTRIN XL) 150 MG 24 hr tablet Take 150 mg by mouth daily.   busPIRone (BUSPAR) 7.5 MG tablet Take 7.5 mg by mouth 2 (two) times daily.   CALCIUM CITRATE PO Take 500 mg by mouth daily.   diclofenac Sodium (VOLTAREN) 1 % GEL Apply 2 g topically 4 (four) times daily.   DULoxetine (CYMBALTA) 60 MG capsule Take 60  mg by mouth daily.   fexofenadine (ALLEGRA) 180 MG tablet Take 180 mg by mouth daily.   gabapentin (NEURONTIN) 300 MG capsule Take 300 mg by mouth daily.   linaclotide (LINZESS) 290 MCG CAPS capsule Take 290 mcg by mouth daily before breakfast.   lisdexamfetamine (VYVANSE) 40 MG capsule Take 1 capsule by mouth once daily in the morning (Patient taking differently: Take 40 mg by mouth every morning.)   LORazepam (ATIVAN) 0.5 MG tablet Take 0.25 mg by mouth 2 (two) times daily as needed for anxiety.   meloxicam (MOBIC) 15 MG tablet Take 1 tablet (15 mg total) by mouth daily as needed for pain. (Patient taking differently: Take 15 mg by mouth daily as needed for pain.)   methocarbamol (ROBAXIN) 500 MG tablet Take 1 tablet (500 mg total) by mouth every 6 (six) hours as needed for muscle spasms (moderate postop pain).   Multiple Vitamins-Minerals (MULTIVITAMIN & MINERAL PO) Take 1 tablet by mouth 2 (two) times daily.    omeprazole (PRILOSEC) 20 MG capsule Take 20 mg by mouth daily.   oxyCODONE (OXY IR/ROXICODONE) 5 MG immediate release tablet Take 1 tablet (5 mg total) by mouth every 4 (four) hours as needed for moderate pain.   pravastatin (PRAVACHOL) 20 MG tablet Take 1 tablet (20 mg total) by mouth every evening.   promethazine (PHENERGAN) 25 MG tablet Take 25 mg by mouth 2 (two) times daily as needed for nausea.   spironolactone (ALDACTONE) 50 MG tablet Take 1 tablet (50 mg total) by mouth daily.   tirzepatide (MOUNJARO) 10 MG/0.5ML Pen Inject 10 mg into the skin once a week.   tirzepatide [facility]) 10 MG/0.5ML Pen Inject 10mg  under the skin once weekly. (Sending multiple doses d/t shortage) 30 days (Patient taking differently: Inject 10 mg into the skin once a week.)   tirzepatide (MOUNJARO) 12.5 MG/0.5ML Pen Inject 12.5mg  under the skin once weekly. (Sending multiple doses d/t shortage) 30 days (Patient taking differently: Inject 12.5 mg into the skin once a week.)   tirzepatide (MOUNJARO) 15  MG/0.5ML Pen Inject 15mg  under the skin once weekly. (Sending multiple doses d/t shortage) 30 days (Patient taking differently: Inject 15 mg into the skin once a week.)   topiramate (TOPAMAX) 50 MG tablet Take 50 mg by mouth daily.   traZODone (DESYREL) 100 MG tablet Take 1 tablet (100 mg total) by mouth at bedtime.   VITAMIN E PO  Take 100 Units by mouth daily.   [DISCONTINUED] tiZANidine (ZANAFLEX) 4 MG capsule Take 4 mg by mouth at bedtime.     Allergies:   Hydrochlorothiazide, Morphine, Aspirin, Nsaids, and Tape   EKGs/Labs/Other Studies Reviewed:    The following studies were reviewed today:  Cardiac Studies & Procedures       ECHOCARDIOGRAM  ECHOCARDIOGRAM COMPLETE 04/01/2023  Narrative ECHOCARDIOGRAM REPORT    Patient Name:   Mindy Reed Date of Exam: 04/01/2023 Medical Rec #:  [id number]    Height:       66.7 in Accession #:    [id number]   Weight:       249.2 lb Date of Birth:  [birth year]   BSA:          2.214 m Patient Age:    41 years     BP:           119/67 mmHg Patient Gender: F            HR:           65 bpm. Exam Location:  [place]  Procedure: 2D Echo, Cardiac Doppler, Color Doppler and Strain Analysis  Indications:    SOB (shortness of breath) [R06.02 (ICD-10-CM)]; Benign essential hypertension [I10 (ICD-10-CM)]; Palpitations [R00.2 (ICD-10-CM)]; Attention deficit hyperactivity disorder, predominantly inattentive type [F90.0 (ICD-10-CM)]; Hypokalemia [E87.6 (ICD-10-CM)]; Chest pain of uncertain etiology [R07.9 (ICD-10-CM)]; Precordial pain [R07.2 (ICD-10-CM)]  History:        Patient has no prior history of Echocardiogram examinations.  Sonographer:    Louie Boston RDCS Referring Phys: [id number] Patrisha Hausmann J Jeweldean Drohan  IMPRESSIONS   1. Left ventricular ejection fraction, by estimation, is 60 to 65%. The left ventricle has normal function. The left ventricle has no regional wall motion abnormalities. GLS-21.4% 2. Right ventricular systolic function is normal. The  right ventricular size is normal. There is normal pulmonary artery systolic pressure. 3. The mitral valve is normal in structure. No evidence of mitral valve regurgitation. No evidence of mitral stenosis. 4. The aortic valve is normal in structure. Aortic valve regurgitation is not visualized. No aortic stenosis is present. 5. The inferior vena cava is normal in size with greater than 50% respiratory variability, suggesting right atrial pressure of 3 mmHg.  FINDINGS Left Ventricle: Left ventricular ejection fraction, by estimation, is 60 to 65%. The left ventricle has normal function. The left ventricle has no regional wall motion abnormalities. The left ventricular internal cavity size was normal in size. There is no left ventricular hypertrophy. Left ventricular diastolic parameters were normal.  Right Ventricle: The right ventricular size is normal. No increase in right ventricular wall thickness. Right ventricular systolic function is normal. There is normal pulmonary artery systolic pressure. The tricuspid regurgitant velocity is 2.36 m/s, and with an assumed right atrial pressure of 8 mmHg, the estimated right ventricular systolic pressure is 30.3 mmHg.  Left Atrium: Left atrial size was normal in size.  Right Atrium: Right atrial size was normal in size.  Pericardium: There is no evidence of pericardial effusion.  Mitral Valve: The mitral valve is normal in structure. No evidence of mitral valve regurgitation. No evidence of mitral valve stenosis.  Tricuspid Valve: The tricuspid valve is normal in structure. Tricuspid valve regurgitation is not demonstrated. No evidence of tricuspid stenosis.  Aortic Valve: The aortic valve is normal in structure. Aortic valve regurgitation is not visualized. No aortic stenosis is present.  Pulmonic Valve: The pulmonic valve was normal in structure. Pulmonic valve regurgitation is not visualized.  No evidence of pulmonic stenosis.  Aorta: The aortic  root is normal in size and structure.  Venous: The inferior vena cava is normal in size with greater than 50% respiratory variability, suggesting right atrial pressure of 3 mmHg.  IAS/Shunts: No atrial level shunt detected by color flow Doppler.   LEFT VENTRICLE PLAX 2D LVIDd:         5.10 cm   Diastology LVIDs:         3.60 cm   LV e' medial:    12.60 cm/s LV PW:         0.80 cm   LV E/e' medial:  8.2 LV IVS:        0.80 cm   LV e' lateral:   14.40 cm/s LVOT diam:     1.90 cm   LV E/e' lateral: 7.2 LV SV:         73 LV SV Index:   33 LVOT Area:     2.84 cm   RIGHT VENTRICLE             IVC RV Basal diam:  3.30 cm     IVC diam: 2.10 cm RV S prime:     11.00 cm/s TAPSE (M-mode): 2.4 cm  LEFT ATRIUM             Index        RIGHT ATRIUM           Index LA diam:        3.60 cm 1.63 cm/m   RA Area:     17.10 cm LA Vol (A2C):   52.9 ml 23.90 ml/m  RA Volume:   50.60 ml  22.86 ml/m LA Vol (A4C):   52.8 ml 23.85 ml/m LA Biplane Vol: 53.5 ml 24.17 ml/m AORTIC VALVE LVOT Vmax:   118.00 cm/s LVOT Vmean:  78.100 cm/s LVOT VTI:    0.258 m  AORTA Ao Root diam: 3.10 cm Ao Asc diam:  3.00 cm Ao Desc diam: 2.10 cm  MV E velocity: 103.00 cm/s  TRICUSPID VALVE MV A velocity: 60.30 cm/s   TR Peak grad:   22.3 mmHg MV E/A ratio:  1.71         TR Vmax:        236.00 cm/s  SHUNTS Systemic VTI:  0.26 m Systemic Diam: 1.90 cm  Belva Crome MD Electronically signed by Belva Crome MD Signature Date/Time: 04/01/2023/2:53:20 PM    Final     CT SCANS  CT CORONARY MORPH W/CTA COR W/SCORE 03/25/2023  Addendum 03/25/2023  2:59 PM ADDENDUM REPORT: 03/25/2023 14:57  EXAM: OVER-READ INTERPRETATION  CT CHEST  The following report is an over-read performed by radiologist Dr. Arvella Nigh [facility] on 03/25/2023. This over-read does not include interpretation of cardiac or coronary anatomy or pathology. The coronary CTA interpretation by the cardiologist  is attached.  COMPARISON:  None.  FINDINGS: Small sliding hiatal hernia is present. Remaining mediastinal structures are unremarkable. No visualized mediastinal/hilar adenopathy.  Visualized mid to lower lungs demonstrate no acute airspace process or effusion. Airways are normal.  Bony structures are unremarkable.  No focal abnormality.  IMPRESSION: 1.  No acute or concerning extracardiac findings.  2.  Small sliding hiatal hernia.   Electronically Signed By: Elberta Fortis M.D. On: 03/25/2023 14:57  Narrative : 41 yo female with chest pain  Image quality: Excellent.  Noise artifact is: Limited  Coronary Arteries:  Normal coronary origin.  Codominance.  Left main: The  left main is a large caliber vessel with a normal take off from the left coronary cusp that bifurcates to form a left anterior descending artery and a left circumflex artery. There is no plaque or stenosis.  Left anterior descending artery: The LAD is patent without evidence of plaque or stenosis. The LAD gives off 1 patent diagonal branch.  Left circumflex artery: The LCX is patent with no evidence of plaque or stenosis. The LCX gives off 2 patent obtuse marginal branches and terminates in left posterolateral and PDA.  Right coronary artery: The RCA with normal take off from the right coronary cusp. There is no evidence of plaque or stenosis. The RCA terminates as a PDA without evidence of plaque or stenosis.  Right Atrium: Right atrial size is within normal limits.  Right Ventricle: The right ventricular cavity is within normal limits.  Left Atrium: Left atrial size is normal in size with no left atrial appendage filling defect.  Left Ventricle: The ventricular cavity size is within normal limits. There are no stigmata of prior infarction. There is no abnormal filling defect.  Pulmonary arteries: Normal in size without proximal filling defect.  Pulmonary veins: Normal pulmonary venous  drainage.  Pericardium: Normal thickness with no significant effusion or calcium present.  Cardiac valves: The aortic valve is trileaflet without significant calcification. The mitral valve is normal structure without significant calcification.  Aorta: Normal caliber with no significant disease.  Extra-cardiac findings: See attached radiology report for non-cardiac structures.  IMPRESSION: 1. Coronary calcium score of 0. This was 0 percentile for age-, sex, and race-matched controls.  2. Total plaque volume 7 mm3 which is 54 percentile for age- and sex-matched controls (calcified plaque 0 mm3; non-calcified plaque 7 mm3). TPV is (milld).  3. Normal coronary origin with codominance.  4. Normal coronary arteries.  RECOMMENDATIONS: 1. CAD-RADS 0: No evidence of CAD (0%). Consider non-atherosclerotic causes of chest pain.  Olga Millers, MD  Electronically Signed: By: Olga Millers M.D. On: 03/20/2023 09:54              Recent Labs: 04/06/2023: ALT 18; BUN 15; Creatinine, Ser 1.05; Potassium 4.1; Sodium 138 04/10/2023: Hemoglobin 10.4; Platelets 319  Recent Lipid Panel    Component Value Date/Time   CHOL 229 (H) 05/02/2021 1623   TRIG 108 05/02/2021 1623   HDL 65 05/02/2021 1623   LDLCALC 145 (H) 05/02/2021 1623    Physical Exam:    VS:  BP 118/72 (BP Location: Left Arm, Patient Position: Sitting)   Pulse 65   Ht 5' 6.75" (1.695 m)   Wt 258 lb 3.2 oz (117.1 kg)   LMP 03/19/2023 (Approximate) Comment: irregular periods  SpO2 100%   BMI 40.74 kg/m     Wt Readings from Last 3 Encounters:  04/16/23 258 lb 3.2 oz (117.1 kg)  04/09/23 264 lb 4.8 oz (119.9 kg)  04/06/23 260 lb (117.9 kg)     GEN: She has pallor of the skin well nourished, well developed in no acute distress HEENT: Normal NECK: No JVD; No carotid bruits LYMPHATICS: No lymphadenopathy CARDIAC: RRR, no murmurs, rubs, gallops RESPIRATORY:  Clear to auscultation without rales, wheezing or  rhonchi  ABDOMEN: Soft, non-tender, non-distended MUSCULOSKELETAL:  No edema; No deformity  SKIN: Warm and dry NEUROLOGIC:  Alert and oriented x 3 PSYCHIATRIC:  Normal affect    Signed, Norman Herrlich, MD  04/16/2023 11:01 AM    [facility]

## 2023-04-16 ENCOUNTER — Ambulatory Visit: Payer: BC Managed Care – PPO | Attending: Cardiology | Admitting: Cardiology

## 2023-04-16 ENCOUNTER — Encounter: Payer: Self-pay | Admitting: Cardiology

## 2023-04-16 VITALS — BP 118/72 | HR 65 | Ht 66.75 in | Wt 258.2 lb

## 2023-04-16 DIAGNOSIS — E876 Hypokalemia: Secondary | ICD-10-CM | POA: Diagnosis not present

## 2023-04-16 DIAGNOSIS — I10 Essential (primary) hypertension: Secondary | ICD-10-CM

## 2023-04-16 DIAGNOSIS — R0602 Shortness of breath: Secondary | ICD-10-CM

## 2023-04-16 DIAGNOSIS — R000 Tachycardia, unspecified: Secondary | ICD-10-CM

## 2023-04-16 NOTE — Patient Instructions (Signed)
Medication Instructions:  Your physician has recommended you make the following change in your medication:   STOP: Zanaflex  *If you need a refill on your cardiac medications before your next appointment, please call your pharmacy*   Lab Work: None If you have labs (blood work) drawn today and your tests are completely normal, you will receive your results only by: MyChart Message (if you have MyChart) OR A paper copy in the mail If you have any lab test that is abnormal or we need to change your treatment, we will call you to review the results.   Testing/Procedures: None   Follow-Up: At [facility], you and your health needs are our priority.  As part of our continuing mission to provide you with exceptional heart care, we have created designated Provider Care Teams.  These Care Teams include your primary Cardiologist (physician) and Advanced Practice Providers (APPs -  Physician Assistants and Nurse Practitioners) who all work together to provide you with the care you need, when you need it.  We recommend signing up for the patient portal called "MyChart".  Sign up information is provided on this After Visit Summary.  MyChart is used to connect with patients for Virtual Visits (Telemedicine).  Patients are able to view lab/test results, encounter notes, upcoming appointments, etc.  Non-urgent messages can be sent to your provider as well.   To learn more about what you can do with MyChart, go to [email or web address].    Your next appointment:   Follow up as needed  Provider:   Norman Herrlich, MD    Other Instructions None

## 2023-05-15 ENCOUNTER — Other Ambulatory Visit (HOSPITAL_COMMUNITY): Payer: Self-pay

## 2023-05-20 ENCOUNTER — Other Ambulatory Visit (HOSPITAL_COMMUNITY): Payer: Self-pay

## 2023-05-20 MED ORDER — GABAPENTIN 300 MG PO CAPS
300.0000 mg | ORAL_CAPSULE | Freq: Every day | ORAL | 3 refills | Status: DC
  Filled 2023-05-20: qty 90, 90d supply, fill #0
  Filled 2023-09-07: qty 90, 90d supply, fill #1
  Filled 2023-12-27: qty 90, 90d supply, fill #2
  Filled 2024-04-21: qty 90, 90d supply, fill #3

## 2023-05-20 MED ORDER — LORAZEPAM 0.5 MG PO TABS
0.5000 mg | ORAL_TABLET | Freq: Every day | ORAL | 0 refills | Status: DC | PRN
  Filled 2023-05-20: qty 30, 30d supply, fill #0

## 2023-05-20 MED ORDER — LISDEXAMFETAMINE DIMESYLATE 40 MG PO CAPS
40.0000 mg | ORAL_CAPSULE | Freq: Every morning | ORAL | 0 refills | Status: DC
  Filled 2023-05-20: qty 30, 30d supply, fill #0

## 2023-06-08 ENCOUNTER — Other Ambulatory Visit (HOSPITAL_COMMUNITY): Payer: Self-pay

## 2023-06-08 MED ORDER — LINZESS 290 MCG PO CAPS
290.0000 ug | ORAL_CAPSULE | Freq: Every day | ORAL | 1 refills | Status: DC
  Filled 2023-06-08: qty 90, 90d supply, fill #0
  Filled 2023-09-07: qty 90, 90d supply, fill #1

## 2023-06-09 ENCOUNTER — Other Ambulatory Visit (HOSPITAL_COMMUNITY): Payer: Self-pay

## 2023-06-21 ENCOUNTER — Other Ambulatory Visit (HOSPITAL_COMMUNITY): Payer: Self-pay

## 2023-06-24 ENCOUNTER — Other Ambulatory Visit (HOSPITAL_COMMUNITY): Payer: Self-pay

## 2023-06-24 MED ORDER — LISDEXAMFETAMINE DIMESYLATE 40 MG PO CAPS
40.0000 mg | ORAL_CAPSULE | Freq: Every morning | ORAL | 0 refills | Status: DC
  Filled 2023-06-24: qty 30, 30d supply, fill #0

## 2023-06-29 ENCOUNTER — Other Ambulatory Visit (HOSPITAL_COMMUNITY): Payer: Self-pay

## 2023-06-29 MED ORDER — BUPROPION HCL ER (XL) 150 MG PO TB24
150.0000 mg | ORAL_TABLET | Freq: Every day | ORAL | 0 refills | Status: DC
  Filled 2023-06-29: qty 90, 90d supply, fill #0

## 2023-06-29 MED ORDER — MELOXICAM 15 MG PO TABS
15.0000 mg | ORAL_TABLET | Freq: Every day | ORAL | 0 refills | Status: DC | PRN
  Filled 2023-06-29: qty 90, 90d supply, fill #0

## 2023-06-29 MED ORDER — LINZESS 290 MCG PO CAPS
290.0000 ug | ORAL_CAPSULE | Freq: Every day | ORAL | 1 refills | Status: DC
  Filled 2023-06-29: qty 90, 90d supply, fill #0
  Filled 2023-08-09: qty 90, fill #0
  Filled 2023-09-28 – 2023-11-27 (×2): qty 90, 90d supply, fill #0
  Filled 2024-03-13: qty 90, 90d supply, fill #1

## 2023-06-29 MED ORDER — DULOXETINE HCL 60 MG PO CPEP
60.0000 mg | ORAL_CAPSULE | Freq: Every day | ORAL | 0 refills | Status: DC
  Filled 2023-06-29: qty 90, 90d supply, fill #0

## 2023-06-29 MED ORDER — TRAZODONE HCL 100 MG PO TABS
100.0000 mg | ORAL_TABLET | Freq: Every day | ORAL | 3 refills | Status: DC
  Filled 2023-06-29: qty 90, 90d supply, fill #0
  Filled 2023-09-28: qty 90, 90d supply, fill #1
  Filled 2023-12-27: qty 90, 90d supply, fill #2
  Filled 2024-04-12: qty 90, 90d supply, fill #3

## 2023-06-29 MED ORDER — SPIRONOLACTONE 50 MG PO TABS
50.0000 mg | ORAL_TABLET | Freq: Every day | ORAL | 3 refills | Status: DC
  Filled 2023-06-29: qty 90, 90d supply, fill #0
  Filled 2024-04-12: qty 90, 90d supply, fill #1

## 2023-06-30 ENCOUNTER — Other Ambulatory Visit (HOSPITAL_COMMUNITY): Payer: Self-pay

## 2023-06-30 MED ORDER — BUSPIRONE HCL 15 MG PO TABS
15.0000 mg | ORAL_TABLET | Freq: Two times a day (BID) | ORAL | 1 refills | Status: DC
  Filled 2023-06-30: qty 180, 90d supply, fill #0
  Filled 2023-09-28 – 2023-12-27 (×2): qty 180, 90d supply, fill #1

## 2023-06-30 MED ORDER — LISDEXAMFETAMINE DIMESYLATE 40 MG PO CAPS
40.0000 mg | ORAL_CAPSULE | Freq: Every morning | ORAL | 0 refills | Status: DC
  Filled 2023-06-30: qty 30, 30d supply, fill #0

## 2023-07-01 ENCOUNTER — Other Ambulatory Visit (HOSPITAL_COMMUNITY): Payer: Self-pay

## 2023-07-01 ENCOUNTER — Other Ambulatory Visit: Payer: Self-pay

## 2023-07-01 MED ORDER — LISDEXAMFETAMINE DIMESYLATE 60 MG PO CAPS
60.0000 mg | ORAL_CAPSULE | Freq: Every morning | ORAL | 0 refills | Status: DC
Start: 2023-07-01 — End: 2023-07-27
  Filled 2023-07-01: qty 30, 30d supply, fill #0

## 2023-07-03 ENCOUNTER — Other Ambulatory Visit (HOSPITAL_COMMUNITY): Payer: Self-pay

## 2023-07-04 ENCOUNTER — Other Ambulatory Visit (HOSPITAL_COMMUNITY): Payer: Self-pay

## 2023-07-08 ENCOUNTER — Other Ambulatory Visit (HOSPITAL_COMMUNITY): Payer: Self-pay

## 2023-07-09 ENCOUNTER — Other Ambulatory Visit (HOSPITAL_COMMUNITY): Payer: Self-pay

## 2023-07-09 MED ORDER — TOPIRAMATE ER 50 MG PO CAP24
50.0000 mg | ORAL_CAPSULE | Freq: Every day | ORAL | 1 refills | Status: DC
  Filled 2023-07-09: qty 90, 90d supply, fill #0

## 2023-07-09 MED ORDER — DULOXETINE HCL 60 MG PO CPEP
60.0000 mg | ORAL_CAPSULE | Freq: Every day | ORAL | 3 refills | Status: DC
  Filled 2023-07-09 – 2023-12-27 (×3): qty 90, 90d supply, fill #0

## 2023-07-10 ENCOUNTER — Other Ambulatory Visit (HOSPITAL_COMMUNITY): Payer: Self-pay

## 2023-07-13 ENCOUNTER — Other Ambulatory Visit (HOSPITAL_COMMUNITY): Payer: Self-pay

## 2023-07-14 ENCOUNTER — Other Ambulatory Visit: Payer: Self-pay

## 2023-07-14 ENCOUNTER — Other Ambulatory Visit (HOSPITAL_COMMUNITY): Payer: Self-pay

## 2023-07-27 ENCOUNTER — Other Ambulatory Visit (HOSPITAL_COMMUNITY): Payer: Self-pay

## 2023-07-27 MED ORDER — LISDEXAMFETAMINE DIMESYLATE 60 MG PO CAPS
ORAL_CAPSULE | ORAL | 0 refills | Status: DC
Start: 2023-07-27 — End: 2023-09-21
  Filled 2023-07-27 – 2023-08-11 (×2): qty 30, 30d supply, fill #0

## 2023-08-09 ENCOUNTER — Other Ambulatory Visit (HOSPITAL_COMMUNITY): Payer: Self-pay

## 2023-08-09 ENCOUNTER — Encounter (HOSPITAL_COMMUNITY): Payer: Self-pay | Admitting: Pharmacist

## 2023-08-10 ENCOUNTER — Other Ambulatory Visit (HOSPITAL_COMMUNITY): Payer: Self-pay

## 2023-08-10 ENCOUNTER — Other Ambulatory Visit: Payer: Self-pay

## 2023-08-12 ENCOUNTER — Other Ambulatory Visit (HOSPITAL_COMMUNITY): Payer: Self-pay

## 2023-08-24 ENCOUNTER — Other Ambulatory Visit (HOSPITAL_COMMUNITY): Payer: Self-pay

## 2023-08-24 ENCOUNTER — Other Ambulatory Visit: Payer: Self-pay

## 2023-08-24 MED ORDER — NITROFURANTOIN MONOHYD MACRO 100 MG PO CAPS
100.0000 mg | ORAL_CAPSULE | Freq: Two times a day (BID) | ORAL | 0 refills | Status: AC
  Filled 2023-08-24: qty 20, 10d supply, fill #0

## 2023-08-24 MED ORDER — QULIPTA 60 MG PO TABS
60.0000 mg | ORAL_TABLET | Freq: Every day | ORAL | 2 refills | Status: DC
  Filled 2023-08-24: qty 30, 30d supply, fill #0
  Filled 2023-09-19: qty 30, 30d supply, fill #1
  Filled 2023-10-27: qty 30, 30d supply, fill #2

## 2023-08-24 MED ORDER — PREDNISONE 5 MG PO TABS
20.0000 mg | ORAL_TABLET | Freq: Two times a day (BID) | ORAL | 0 refills | Status: AC
  Filled 2023-08-24: qty 40, 5d supply, fill #0

## 2023-08-25 ENCOUNTER — Other Ambulatory Visit (HOSPITAL_COMMUNITY): Payer: Self-pay

## 2023-08-25 DIAGNOSIS — M1711 Unilateral primary osteoarthritis, right knee: Secondary | ICD-10-CM | POA: Insufficient documentation

## 2023-09-04 ENCOUNTER — Telehealth: Payer: Self-pay

## 2023-09-04 NOTE — Telephone Encounter (Signed)
  Patient Consent for Virtual Visit        Mindy Reed has provided verbal consent on 09/04/2023 for a virtual visit (video or telephone).   CONSENT FOR VIRTUAL VISIT FOR:  Mindy Reed  By participating in this virtual visit I agree to the following:  I hereby voluntarily request, consent and authorize [facility] and its employed or contracted physicians, physician assistants, nurse practitioners or other licensed health care professionals (the Practitioner), to provide me with telemedicine health care services (the "Services") as deemed necessary by the treating Practitioner. I acknowledge and consent to receive the Services by the Practitioner via telemedicine. I understand that the telemedicine visit will involve communicating with the Practitioner through live audiovisual communication technology and the disclosure of certain medical information by electronic transmission. I acknowledge that I have been given the opportunity to request an in-person assessment or other available alternative prior to the telemedicine visit and am voluntarily participating in the telemedicine visit.  I understand that I have the right to withhold or withdraw my consent to the use of telemedicine in the course of my care at any time, without affecting my right to future care or treatment, and that the Practitioner or I may terminate the telemedicine visit at any time. I understand that I have the right to inspect all information obtained and/or recorded in the course of the telemedicine visit and may receive copies of available information for a reasonable fee.  I understand that some of the potential risks of receiving the Services via telemedicine include:  Delay or interruption in medical evaluation due to technological equipment failure or disruption; Information transmitted may not be sufficient (e.g. poor resolution of images) to allow for appropriate medical decision making by the Practitioner;  and/or  In rare instances, security protocols could fail, causing a breach of personal health information.  Furthermore, I acknowledge that it is my responsibility to provide information about my medical history, conditions and care that is complete and accurate to the best of my ability. I acknowledge that Practitioner's advice, recommendations, and/or decision may be based on factors not within their control, such as incomplete or inaccurate data provided by me or distortions of diagnostic images or specimens that may result from electronic transmissions. I understand that the practice of medicine is not an exact science and that Practitioner makes no warranties or guarantees regarding treatment outcomes. I acknowledge that a copy of this consent can be made available to me via my patient portal [facility] MyChart), or I can request a printed copy by calling the office of [facility].    I understand that my insurance will be billed for this visit.   I have read or had this consent read to me. I understand the contents of this consent, which adequately explains the benefits and risks of the Services being provided via telemedicine.  I have been provided ample opportunity to ask questions regarding this consent and the Services and have had my questions answered to my satisfaction. I give my informed consent for the services to be provided through the use of telemedicine in my medical care

## 2023-09-04 NOTE — Telephone Encounter (Signed)
   Name: Mindy Reed  DOB: [birth year]  MRN: [id number]  Primary Cardiologist: Norman Herrlich, MD   Preoperative team, please contact this patient and set up a phone call appointment for further preoperative risk assessment. Please obtain consent and complete medication review. Thank you for your help.  I confirm that guidance regarding antiplatelet and oral anticoagulation therapy has been completed and, if necessary, noted below (pt not on antiplatelet/anticoagulation therapy).   I also confirmed the patient resides in the state of [place]. As per [facility] Medical Board telemedicine laws, the patient must reside in the state in which the provider is licensed.   Joylene Grapes, NP 09/04/2023, 12:30 PM [facility]

## 2023-09-04 NOTE — Telephone Encounter (Signed)
Spoke with patient who is agreeable to do a tele visit on 12/19 at 9:40 am. Consent giving, med rec to be completed.

## 2023-09-04 NOTE — Telephone Encounter (Signed)
   [facility] Pre-operative Risk Assessment    Request for surgical clearance:  What type of surgery is being performed? Right Total Knee Replacement   When is this surgery scheduled? 10/29/2023   What type of clearance is required (medical clearance vs. Pharmacy clearance to hold med vs. Both)? Both  Are there any medications that need to be held prior to surgery and how long?Not specified   Practice name and name of physician performing surgery? : Dr. Latanya Maudlin at [facility]    What is your office phone number; [phone] Att: [name]    7.   What is your office fax number: [phone]  8.   Anesthesia type (None, local, MAC, general) ? Spinal   Mindy Reed 09/04/2023, 10:44 AM  _________________________________________________________________   (provider comments below)

## 2023-09-07 ENCOUNTER — Other Ambulatory Visit: Payer: Self-pay

## 2023-09-07 ENCOUNTER — Other Ambulatory Visit (HOSPITAL_COMMUNITY): Payer: Self-pay

## 2023-09-21 ENCOUNTER — Other Ambulatory Visit (HOSPITAL_COMMUNITY): Payer: Self-pay

## 2023-09-21 MED ORDER — LISDEXAMFETAMINE DIMESYLATE 60 MG PO CAPS
60.0000 mg | ORAL_CAPSULE | Freq: Every morning | ORAL | 0 refills | Status: DC
Start: 2023-09-21 — End: 2023-10-27
  Filled 2023-09-21: qty 30, 30d supply, fill #0

## 2023-09-28 ENCOUNTER — Other Ambulatory Visit: Payer: Self-pay

## 2023-09-28 ENCOUNTER — Other Ambulatory Visit (HOSPITAL_COMMUNITY): Payer: Self-pay

## 2023-09-28 MED ORDER — MELOXICAM 15 MG PO TABS
15.0000 mg | ORAL_TABLET | Freq: Every day | ORAL | 0 refills | Status: DC | PRN
  Filled 2023-09-28: qty 90, 90d supply, fill #0

## 2023-10-08 ENCOUNTER — Ambulatory Visit: Payer: BC Managed Care – PPO | Attending: Nurse Practitioner

## 2023-10-08 DIAGNOSIS — Z01810 Encounter for preprocedural cardiovascular examination: Secondary | ICD-10-CM

## 2023-10-08 NOTE — Progress Notes (Signed)
Virtual Visit via Telephone Note   Because of Mindy Reed co-morbid illnesses, she is at least at moderate risk for complications without adequate follow up.  This format is felt to be most appropriate for this patient at this time.  The patient did not have access to video technology/had technical difficulties with video requiring transitioning to audio format only (telephone).  All issues noted in this document were discussed and addressed.  No physical exam could be performed with this format.  Please refer to the patient's chart for her consent to telehealth for [facility].  Evaluation Performed:  Preoperative cardiovascular risk assessment _____________   Date:  10/08/2023   Patient ID:  Mindy Reed, DOB [birth year], MRN [id number] Patient Location:  Home Provider location:   Office  Primary Care Provider:  Lonie Peak, PA-C Primary Cardiologist:  Norman Herrlich, MD  Chief Complaint / Patient Profile   42 y.o. y/o female with a h/o HTN, tachycardia, DM type II who is pending right total knee replacement and presents today for telephonic preoperative cardiovascular risk assessment.  History of Present Illness    Mindy Reed is a 42 y.o. female who presents via audio/video conferencing for a telehealth visit today.  Pt was last seen in cardiology clinic on 04/16/2023 by Dr. Dulce Sellar.  At that time Mindy Reed was doing well with no new cardiac complaints.  She had coronary CTA completed 02/2023 with calcium score of 0. The patient is now pending procedure as outlined above. Since her last visit, she has been doing well with no new cardiac complaints.  She does endorse occasional short burst of palpitations that are spontaneous and asymptomatic.  She reports her blood pressures are stable and are checked frequently at her doctors visits.  She denies chest pain, shortness of breath, lower extremity edema, fatigue, palpitations, melena, hematuria, hemoptysis, diaphoresis,  weakness, presyncope, syncope, orthopnea, and PND.    Past Medical History    Past Medical History:  Diagnosis Date   Abnormal uterine bleeding (AUB)    Abnormally small mouth    no problems with intubation   Anemia    hx of   Anxiety disorder 07/24/2014   Arthritis    right knee and right ankle   Asthma 04/13/2014   Follow w/ Lonie Peak, PCP.   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   Complication of anesthesia    was hard to wake up after gastric bypass; states "fought them" during induction for D & C   Dental crowns present    Depressive disorder 05/02/2021   Follows w/ PCP and counseling.   Eczema    Elevated platelet count 07/11/2014   Formatting of this note might be different from the original. Elevated Platelet count in pregnancy 493 in 03/2014 repeat was 482, per Dr. Marjo Bicker refer to hematology 07/11/2014 Formatting of this note might be different from the original. Elevated Platelet count in pregnancy 493 in 03/2014 repeat was 482, per Dr. 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 Helane Rima at [facility].   Palpitations    Follows w/ Dr.  Norman Herrlich, cardiology.   PCOS (polycystic ovarian syndrome)    Pneumonia    no hospitalization, around 2021   Pre-existing hypertension during pregnancy in first trimester 04/13/2014    Seasonal allergies    Seasonal asthma    SOB (shortness of breath)    See cardiology OV from 03/09/23 in Epic. Cardiac work-up was negative. 04/01/23 Echo EF 60-65%, 03/20/23 CT Coronary   Type 2 diabetes mellitus (HCC) 07/24/2014   Follows w/ PCP and [facility].   Wears contact lenses    Wears glasses    Past Surgical History:  Procedure Laterality Date   BREAST BIOPSY Left 2017 nov   desmoid tumor benign    BREAST EXCISIONAL BIOPSY Left    CHOLECYSTECTOMY  2010   DG BARIUM SWALLOW ([facility] HX)  08/27/2015   ESOPHAGOGASTRODUODENOSCOPY (EGD) WITH PROPOFOL N/A 08/17/2017   Procedure: ESOPHAGOGASTRODUODENOSCOPY (EGD) WITH PROPOFOL;  Surgeon: Luretha Murphy, MD;  Location: [facility];  Service: General;  Laterality: N/A;   FLEXIBLE SIGMOIDOSCOPY  as teenager   HIGH RISK BREAST EXCISION     left breast exc bx 2017 desmoid tumor   HYSTEROSCOPY WITH D & C N/A 07/18/2015   Procedure: DILATATION AND CURETTAGE /HYSTEROSCOPY;  Surgeon: Harold Hedge, MD;  Location: [facility];  Service: Gynecology;  Laterality: N/A;   HYSTEROSCOPY WITH D & C N/A 11/07/2020   Procedure: DILATATION AND CURETTAGE /HYSTEROSCOPY WITH MYOSURE;  Surgeon: Carver Fila, MD;  Location: [facility];  Service: Gynecology;  Laterality: N/A;   INTRAUTERINE DEVICE (IUD) INSERTION N/A 07/18/2015   Procedure: INTRAUTERINE DEVICE (IUD) INSERTION;  Surgeon: Harold Hedge, MD;  Location: [facility];  Service: Gynecology;  Laterality: N/A;   INTRAUTERINE DEVICE (IUD) INSERTION N/A 11/07/2020   Procedure: INTRAUTERINE DEVICE (IUD) INSERTION MIRENA;  Surgeon: Carver Fila, MD;  Location: [facility];  Service: Gynecology;  Laterality: N/A;   LAPAROSCOPIC ROUX-EN-Y GASTRIC BYPASS WITH HIATAL HERNIA REPAIR  08/27/2015   Procedure: LAPAROSCOPIC ROUX-EN-Y GASTRIC BYPASS WITH HIATAL HERNIA REPAIR;  Surgeon: Luretha Murphy, MD;  Location: [facility];  Service: General;;   LAPAROSCOPIC VAGINAL HYSTERECTOMY WITH SALPINGECTOMY Bilateral  04/09/2023   Procedure: LAPAROSCOPIC ASSISTED VAGINAL HYSTERECTOMY WITH SALPINGECTOMY;  Surgeon: Harold Hedge, MD;  Location: [facility];  Service: Gynecology;  Laterality: Bilateral;   ORIF ANKLE FRACTURE Right    OVARIAN CYST REMOVAL     RADIOACTIVE SEED GUIDED EXCISIONAL BREAST BIOPSY Left 09/03/2016   Procedure: LEFT RADIOACTIVE SEED GUIDED EXCISIONAL BREAST BIOPSY;  Surgeon: Luretha Murphy, MD;  Location: [facility];  Service: General;  Laterality: Left;   TONSILLECTOMY AND ADENOIDECTOMY     UPPER GI ENDOSCOPY  08/27/2015   Procedure: UPPER GI ENDOSCOPY;  Surgeon: Luretha Murphy, MD;  Location: [facility];  Service: General;;    Allergies  Allergies  Allergen Reactions   Hydrochlorothiazide Other (See Comments)    Dropped K+ level to critical value.   Morphine Hives   Aspirin Other (See Comments)    HAS HAD GASTRIC BYPASS   Nsaids Other (See Comments)    HAS HAD GASTRIC BYPASS   Tape Rash    PAPER TAPE IS OKAY    Home Medications  Prior to Admission medications   Medication Sig Start Date End Date Taking? Authorizing Provider  acetaminophen (TYLENOL) 500 MG tablet Take 1,000 mg by mouth every 6 (six) hours as needed for moderate pain or headache.    [provider]  albuterol (VENTOLIN HFA) 108 (90 Base) MCG/ACT inhaler Inhale 1-2 puffs into the lungs every 4 (four) hours as needed for wheezing or shortness of breath. 11/08/20   [provider]  Ascorbic Acid (VITAMIN C) 100 MG tablet Take 100 mg by mouth daily.    [provider]  Atogepant (QULIPTA) 60 MG TABS Take 1 tablet (60 mg total) by mouth daily. 08/24/23     buPROPion (WELLBUTRIN XL) 150 MG 24 hr tablet Take 150 mg by mouth daily.    [provider]  buPROPion (WELLBUTRIN XL) 150 MG 24 hr tablet Take 1 tablet (150 mg total) by mouth daily. 06/29/23     busPIRone (BUSPAR) 15 MG tablet Take 1 tablet (15 mg total) by mouth 2 (two) times daily. 06/30/23      busPIRone (BUSPAR) 7.5 MG tablet Take 7.5 mg by mouth 2 (two) times daily.    [provider]  CALCIUM CITRATE PO Take 500 mg by mouth daily.    [provider]  diclofenac Sodium (VOLTAREN) 1 % GEL Apply 2 g topically 4 (four) times daily.    [provider]  DULoxetine (CYMBALTA) 60 MG capsule Take 60 mg by mouth daily.    [provider]  DULoxetine (CYMBALTA) 60 MG capsule Take 1 capsule (60 mg total) by mouth daily. 07/09/23     fexofenadine (ALLEGRA) 180 MG tablet Take 180 mg by mouth daily.    [provider]  gabapentin (NEURONTIN) 300 MG capsule Take 300 mg by mouth daily. 12/21/22   [provider]  gabapentin (NEURONTIN) 300 MG capsule Take 1 capsule (300 mg total) by mouth daily. 05/20/23     linaclotide (LINZESS) 290 MCG CAPS capsule Take 290 mcg by mouth daily before breakfast.    [provider]  linaclotide (LINZESS) 290 MCG CAPS capsule Take 1 capsule (290 mcg total) by mouth daily  at least 30 minutes before the first meal of the day on an empty stomach 06/08/23     linaclotide (LINZESS) 290 MCG CAPS capsule Take 1 capsule by mouth once a day  at least 30 minutes before the first meal of the day on an empty stomach 06/29/23     lisdexamfetamine (VYVANSE) 60 MG capsule Take 1 capsule (60 mg total) by mouth every morning. 09/21/23     LORazepam (ATIVAN) 0.5 MG tablet Take 0.25 mg by mouth 2 (two) times daily as needed for anxiety.    [provider]  LORazepam (ATIVAN) 0.5 MG tablet Take 1 tablet (0.5 mg total) by mouth daily as needed for anxiety 05/20/23     meloxicam (MOBIC) 15 MG tablet Take 1 tablet (15 mg total) by mouth daily as needed for pain. Patient taking differently: Take 15 mg by mouth daily as needed for pain. 10/16/22     meloxicam (MOBIC) 15 MG tablet Take 1 tablet (15 mg total) by mouth daily as needed. 09/28/23     methocarbamol (ROBAXIN) 500 MG tablet Take 1 tablet (500 mg total) by mouth every 6  (six) hours as needed for muscle spasms (moderate postop pain). 04/10/23   Harold Hedge, MD  Multiple Vitamins-Minerals (MULTIVITAMIN & MINERAL PO) Take 1 tablet by mouth 2 (two) times daily.  [provider]  omeprazole (PRILOSEC) 20 MG capsule Take 20 mg by mouth daily.    [provider]  oxyCODONE (OXY IR/ROXICODONE) 5 MG immediate release tablet Take 1 tablet (5 mg total) by mouth every 4 (four) hours as needed for moderate pain. 04/10/23   Harold Hedge, MD  pravastatin (PRAVACHOL) 20 MG tablet Take 1 tablet (20 mg total) by mouth every evening. 03/20/23   Baldo Daub, MD  promethazine (PHENERGAN) 25 MG tablet Take 25 mg by mouth 2 (two) times daily as needed for nausea. 10/29/20   [provider]  spironolactone (ALDACTONE) 50 MG tablet Take 1 tablet (50 mg total) by mouth daily. 04/06/23     spironolactone (ALDACTONE) 50 MG tablet Take 1 tablet (50 mg total) by mouth daily. 06/29/23     tirzepatide (MOUNJARO) 10 MG/0.5ML Pen Inject 10 mg into the skin once a week. 01/01/23     tirzepatide (MOUNJARO) 10 MG/0.5ML Pen Inject 10mg  under the skin once weekly. (Sending multiple doses d/t shortage) 30 days Patient taking differently: Inject 10 mg into the skin once a week. 03/12/23     tirzepatide (MOUNJARO) 12.5 MG/0.5ML Pen Inject 12.5mg  under the skin once weekly 03/12/23     tirzepatide [facility]) 15 MG/0.5ML Pen Inject 15mg  under the skin once weekly. 03/12/23     topiramate (TOPAMAX) 50 MG tablet Take 50 mg by mouth daily.    [provider]  Topiramate ER (TROKENDI XR) 50 MG CP24 Take 1 capsule (50 mg total) by mouth daily. 07/09/23     traZODone (DESYREL) 100 MG tablet Take 1 tablet (100 mg total) by mouth at bedtime. 11/25/21   Helane Rima, DO  traZODone (DESYREL) 100 MG tablet Take 1 tablet (100 mg total) by mouth at bedtime as needed for sleep 06/29/23     VITAMIN E PO Take 100 Units by mouth daily.    [provider]    Physical Exam     Vital Signs:  Mindy Reed does not have vital signs available for review today.  Given telephonic nature of communication, physical exam is limited. AAOx3. NAD. Normal affect.  Speech and respirations are unlabored.  Accessory Clinical Findings    None  Assessment & Plan    1.  Preoperative Cardiovascular Risk Assessment: -Patient's RCRI score is 0.9%  The patient affirms she has been doing well without any new cardiac symptoms. They are able to achieve 6 METS without cardiac limitations. Therefore, based on ACC/AHA guidelines, the patient would be at acceptable risk for the planned procedure without further cardiovascular testing. The patient was advised that if she develops new symptoms prior to surgery to contact our office to arrange for a follow-up visit, and she verbalized understanding.   The patient was advised that if she develops new symptoms prior to surgery to contact our office to arrange for a follow-up visit, and she verbalized understanding.   A copy of this note will be routed to requesting surgeon.  Time:   Today, I have spent 7 minutes with the patient with telehealth technology discussing medical history, symptoms, and management plan.     Napoleon Form, Leodis Rains, NP  10/08/2023, 7:18 AM

## 2023-10-13 ENCOUNTER — Other Ambulatory Visit (HOSPITAL_COMMUNITY): Payer: Self-pay

## 2023-10-26 ENCOUNTER — Other Ambulatory Visit (HOSPITAL_COMMUNITY): Payer: Self-pay

## 2023-10-26 MED ORDER — MOUNJARO 12.5 MG/0.5ML ~~LOC~~ SOAJ
12.5000 mg | SUBCUTANEOUS | 3 refills | Status: DC
  Filled 2023-11-03: qty 2, 28d supply, fill #0
  Filled 2023-11-27 – 2023-12-01 (×2): qty 2, 28d supply, fill #1
  Filled 2023-12-26: qty 2, 28d supply, fill #2
  Filled 2024-06-11: qty 2, 28d supply, fill #3

## 2023-10-27 ENCOUNTER — Other Ambulatory Visit: Payer: Self-pay

## 2023-10-27 ENCOUNTER — Other Ambulatory Visit (HOSPITAL_BASED_OUTPATIENT_CLINIC_OR_DEPARTMENT_OTHER): Payer: Self-pay

## 2023-10-27 MED ORDER — LISDEXAMFETAMINE DIMESYLATE 60 MG PO CAPS
60.0000 mg | ORAL_CAPSULE | Freq: Every morning | ORAL | 0 refills | Status: DC
  Filled 2023-10-27: qty 30, 30d supply, fill #0

## 2023-10-30 ENCOUNTER — Other Ambulatory Visit (HOSPITAL_COMMUNITY): Payer: Self-pay

## 2023-11-03 ENCOUNTER — Other Ambulatory Visit (HOSPITAL_COMMUNITY): Payer: Self-pay

## 2023-11-03 ENCOUNTER — Other Ambulatory Visit: Payer: Self-pay

## 2023-11-13 DIAGNOSIS — Z96641 Presence of right artificial hip joint: Secondary | ICD-10-CM | POA: Insufficient documentation

## 2023-11-27 ENCOUNTER — Other Ambulatory Visit (HOSPITAL_COMMUNITY): Payer: Self-pay

## 2023-11-30 ENCOUNTER — Other Ambulatory Visit (HOSPITAL_COMMUNITY): Payer: Self-pay

## 2023-11-30 MED ORDER — QULIPTA 60 MG PO TABS
60.0000 mg | ORAL_TABLET | Freq: Every day | ORAL | 2 refills | Status: DC
  Filled 2023-11-30: qty 30, 30d supply, fill #0
  Filled 2023-12-27: qty 30, 30d supply, fill #1
  Filled 2024-01-29: qty 30, 30d supply, fill #2

## 2023-12-01 ENCOUNTER — Other Ambulatory Visit (HOSPITAL_COMMUNITY): Payer: Self-pay

## 2023-12-03 ENCOUNTER — Other Ambulatory Visit (HOSPITAL_COMMUNITY): Payer: Self-pay

## 2023-12-07 ENCOUNTER — Other Ambulatory Visit (HOSPITAL_COMMUNITY): Payer: Self-pay

## 2023-12-07 MED ORDER — LISDEXAMFETAMINE DIMESYLATE 60 MG PO CAPS
60.0000 mg | ORAL_CAPSULE | Freq: Every morning | ORAL | 0 refills | Status: DC
  Filled 2023-12-07: qty 30, 30d supply, fill #0

## 2023-12-11 DIAGNOSIS — Z96651 Presence of right artificial knee joint: Secondary | ICD-10-CM | POA: Insufficient documentation

## 2023-12-28 ENCOUNTER — Other Ambulatory Visit (HOSPITAL_COMMUNITY): Payer: Self-pay

## 2023-12-31 ENCOUNTER — Other Ambulatory Visit: Payer: Self-pay

## 2024-01-14 ENCOUNTER — Other Ambulatory Visit (HOSPITAL_COMMUNITY): Payer: Self-pay

## 2024-01-14 MED ORDER — MOUNJARO 12.5 MG/0.5ML ~~LOC~~ SOAJ
12.5000 mg | SUBCUTANEOUS | 3 refills | Status: DC
  Filled 2024-01-14 – 2024-01-20 (×2): qty 2, 28d supply, fill #0

## 2024-01-14 MED ORDER — LORAZEPAM 0.5 MG PO TABS
0.5000 mg | ORAL_TABLET | Freq: Every day | ORAL | 0 refills | Status: DC | PRN
Start: 2024-01-14 — End: 2024-09-14
  Filled 2024-01-14: qty 30, 30d supply, fill #0

## 2024-01-14 MED ORDER — LISDEXAMFETAMINE DIMESYLATE 60 MG PO CAPS
60.0000 mg | ORAL_CAPSULE | Freq: Every morning | ORAL | 0 refills | Status: DC
Start: 2024-01-14 — End: 2024-02-08
  Filled 2024-01-14: qty 30, 30d supply, fill #0

## 2024-01-14 MED ORDER — PREDNISONE 5 MG (21) PO TBPK
ORAL_TABLET | ORAL | 0 refills | Status: DC
  Filled 2024-01-14: qty 21, 6d supply, fill #0

## 2024-01-14 MED ORDER — NITROFURANTOIN MONOHYD MACRO 100 MG PO CAPS
100.0000 mg | ORAL_CAPSULE | Freq: Two times a day (BID) | ORAL | 0 refills | Status: DC
  Filled 2024-01-14: qty 20, 10d supply, fill #0

## 2024-01-20 ENCOUNTER — Other Ambulatory Visit (HOSPITAL_COMMUNITY): Payer: Self-pay

## 2024-02-01 ENCOUNTER — Other Ambulatory Visit (HOSPITAL_COMMUNITY): Payer: Self-pay

## 2024-02-08 ENCOUNTER — Other Ambulatory Visit (HOSPITAL_COMMUNITY): Payer: Self-pay

## 2024-02-08 MED ORDER — QULIPTA 60 MG PO TABS
60.0000 mg | ORAL_TABLET | Freq: Every day | ORAL | 1 refills | Status: DC
  Filled 2024-02-10 – 2024-03-13 (×2): qty 30, 30d supply, fill #0
  Filled 2024-04-12: qty 30, 30d supply, fill #1

## 2024-02-08 MED ORDER — LISDEXAMFETAMINE DIMESYLATE 60 MG PO CAPS
60.0000 mg | ORAL_CAPSULE | Freq: Every morning | ORAL | 0 refills | Status: DC
  Filled 2024-02-10 – 2024-02-15 (×2): qty 30, 30d supply, fill #0

## 2024-02-09 ENCOUNTER — Other Ambulatory Visit (HOSPITAL_COMMUNITY): Payer: Self-pay

## 2024-02-10 ENCOUNTER — Other Ambulatory Visit (HOSPITAL_COMMUNITY): Payer: Self-pay

## 2024-02-14 ENCOUNTER — Encounter (HOSPITAL_COMMUNITY): Payer: Self-pay

## 2024-02-15 ENCOUNTER — Other Ambulatory Visit (HOSPITAL_COMMUNITY): Payer: Self-pay

## 2024-02-19 ENCOUNTER — Other Ambulatory Visit (HOSPITAL_COMMUNITY): Payer: Self-pay

## 2024-02-19 MED ORDER — PREDNISONE 5 MG (21) PO TBPK
ORAL_TABLET | ORAL | 0 refills | Status: DC
Start: 2024-02-19 — End: 2024-02-29
  Filled 2024-02-19: qty 21, 6d supply, fill #0

## 2024-02-19 MED ORDER — MINOXIDIL 2.5 MG PO TABS
2.5000 mg | ORAL_TABLET | Freq: Every day | ORAL | 0 refills | Status: DC
  Filled 2024-02-19: qty 30, 30d supply, fill #0

## 2024-02-19 MED ORDER — CEPHALEXIN 500 MG PO CAPS
500.0000 mg | ORAL_CAPSULE | Freq: Four times a day (QID) | ORAL | 0 refills | Status: DC
Start: 2024-02-19 — End: 2024-02-29
  Filled 2024-02-19: qty 40, 10d supply, fill #0

## 2024-02-24 ENCOUNTER — Other Ambulatory Visit (HOSPITAL_COMMUNITY): Payer: Self-pay

## 2024-02-24 MED ORDER — MOUNJARO 15 MG/0.5ML ~~LOC~~ SOAJ
15.0000 mg | SUBCUTANEOUS | 3 refills | Status: DC
  Filled 2024-02-24: qty 2, 28d supply, fill #0
  Filled 2024-03-24: qty 2, 28d supply, fill #1
  Filled 2024-04-21: qty 2, 28d supply, fill #2
  Filled 2024-05-19: qty 2, 28d supply, fill #3

## 2024-02-29 ENCOUNTER — Ambulatory Visit (HOSPITAL_BASED_OUTPATIENT_CLINIC_OR_DEPARTMENT_OTHER)
Admission: EM | Admit: 2024-02-29 | Discharge: 2024-02-29 | Disposition: A | Discharge status: Home / Self Care | Attending: Family Medicine | Admitting: Family Medicine

## 2024-02-29 ENCOUNTER — Ambulatory Visit (INDEPENDENT_AMBULATORY_CARE_PROVIDER_SITE_OTHER)
Admit: 2024-02-29 | Discharge: 2024-02-29 | Disposition: A | Discharge status: Home / Self Care | Attending: Family Medicine | Admitting: Family Medicine

## 2024-02-29 ENCOUNTER — Encounter (HOSPITAL_BASED_OUTPATIENT_CLINIC_OR_DEPARTMENT_OTHER): Payer: Self-pay | Admitting: Emergency Medicine

## 2024-02-29 ENCOUNTER — Ambulatory Visit (HOSPITAL_BASED_OUTPATIENT_CLINIC_OR_DEPARTMENT_OTHER): Admitting: Radiology

## 2024-02-29 DIAGNOSIS — Z8742 Personal history of other diseases of the female genital tract: Secondary | ICD-10-CM

## 2024-02-29 DIAGNOSIS — R1013 Epigastric pain: Secondary | ICD-10-CM | POA: Diagnosis not present

## 2024-02-29 DIAGNOSIS — R1031 Right lower quadrant pain: Secondary | ICD-10-CM

## 2024-02-29 DIAGNOSIS — R198 Other specified symptoms and signs involving the digestive system and abdomen: Secondary | ICD-10-CM | POA: Diagnosis not present

## 2024-02-29 LAB — POCT URINALYSIS DIP (MANUAL ENTRY)
Bilirubin, UA: NEGATIVE
Blood, UA: NEGATIVE
Glucose, UA: NEGATIVE mg/dL
Ketones, POC UA: NEGATIVE mg/dL
Leukocytes, UA: NEGATIVE
Nitrite, UA: NEGATIVE
Protein Ur, POC: NEGATIVE mg/dL
Spec Grav, UA: 1.025
Urobilinogen, UA: 0.2 U/dL
pH, UA: 6

## 2024-02-29 LAB — POCT URINE PREGNANCY: Preg Test, Ur: NEGATIVE

## 2024-02-29 LAB — I-STAT CREATININE (MANUAL ENTRY): Creatinine, Ser: 1.2 — AB (ref 0.50–1.10)

## 2024-02-29 MED ORDER — IOHEXOL 300 MG/ML  SOLN
100.0000 mL | Freq: Once | INTRAMUSCULAR | Status: AC | PRN
  Administered 2024-02-29: 100 mL via INTRAVENOUS

## 2024-02-29 NOTE — Discharge Instructions (Addendum)
 Abdominal pain: Urine pregnancy test was negative.  Urinalysis was normal.  Abdominal x-ray showed some stool in the colon but was otherwise normal.  CT scan does not find anything acutely abnormal in the abdomen.  No kidney stones were seen.  Encouraged to try some laxatives to get empty of stool.  Follow-up with primary care if symptoms do not improve, worsen or new symptoms occur.

## 2024-02-29 NOTE — ED Triage Notes (Signed)
 Patient present with c/o RLQ pain x 3 days. Denies n/v or diarrhea.

## 2024-02-29 NOTE — ED Provider Notes (Addendum)
 [facility]    CSN: [id number] Arrival date & time: 02/29/24  1331      History   Chief Complaint Chief Complaint  Patient presents with   Abdominal Pain    HPI Mindy Reed is a 43 y.o. female.   Patient reports acute onset right lower quadrant abdominal pain on 02/26/2024.  She denies nausea, vomiting, diarrhea, constipation.  She has some mild epigastric pain.  She has an appendix.  She has a history of ovarian cysts.  She has had a hysterectomy.  She is not sure what is going on but the pain has been 10 of 10 at times.  She is concerned that this is an appendicitis or a severe ovarian cyst or something.   Abdominal Pain Associated symptoms: no chest pain, no chills, no constipation, no cough, no diarrhea, no dysuria, no fever, no hematuria, no nausea, no shortness of breath, no sore throat and no vomiting     Past Medical History:  Diagnosis Date   Abnormal uterine bleeding (AUB)    Abnormally small mouth    no problems with intubation   Anemia    hx of   Anxiety disorder 07/24/2014   Arthritis    right knee and right ankle   Asthma 04/13/2014   Follow w/ Aloha Arnold, PCP.   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   Complication of anesthesia    was hard to wake up after gastric bypass; states "fought them" during induction for D & C   Dental crowns present    Depressive disorder 05/02/2021   Follows w/ PCP and counseling.   Eczema    Elevated platelet count 07/11/2014   Formatting of this note might be different from the original. Elevated Platelet count in pregnancy 493 in 03/2014 repeat was 482, per Dr. Berenda Breaker refer to hematology 07/11/2014 Formatting of this note might be different from the original. Elevated Platelet count in pregnancy 493 in 03/2014 repeat was 482, per Dr. 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 Jonn Nett at [facility].   Palpitations    Follows w/ Dr.  Zoe Hinds, cardiology.   PCOS (polycystic ovarian syndrome)    Pneumonia    no hospitalization, around 2021   Pre-existing hypertension during pregnancy in first trimester 04/13/2014   Seasonal allergies    Seasonal asthma    SOB (shortness of breath)    See cardiology OV from 03/09/23 in Epic. Cardiac work-up was negative. 04/01/23 Echo EF 60-65%, 03/20/23 CT Coronary   Type 2 diabetes mellitus (HCC) 07/24/2014   Follows w/ PCP and [facility].   Wears contact lenses    Wears glasses     Patient Active Problem List   Diagnosis Date Noted   Right lower quadrant abdominal  pain 04/14/2023   Other specified metabolic disorders (HCC) 04/14/2023   Migraine without aura and without status migrainosus, not intractable 04/14/2023   Drug-induced constipation 04/14/2023   Body mass index (BMI) 40.0-44.9, adult (HCC) 04/14/2023   Binge eating disorder 04/14/2023   Acute right ankle pain 04/14/2023   Menorrhagia 04/09/2023   SOB (shortness of breath) 01/27/2023   Seasonal allergies 01/27/2023   PCOS (polycystic ovarian syndrome) 01/27/2023   Lower extremity edema 01/27/2023   Infertility, female 01/27/2023   GERD (gastroesophageal reflux disease) 01/27/2023   Eczema 01/27/2023   Dental crowns present 01/27/2023   Complication of anesthesia 01/27/2023   Back pain 01/27/2023   Arthritis 01/27/2023   Anemia  01/27/2023   Abnormally small mouth 01/27/2023   Hidradenitis 01/14/2022   Attention deficit hyperactivity disorder, 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   Depressive disorder 05/02/2021   Hydrosalpinx 05/02/2021   Abnormal uterine bleeding (AUB)    History of bariatric surgery 10/02/2020   Mass of breast, left 08/2016   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 08/28/2015   Morbid obesity (HCC) 08/27/2015   Benign essential hypertension 07/24/2014   Anxiety disorder 07/24/2014   Type 2 diabetes mellitus (HCC) 07/24/2014   Joint pain 07/24/2014   Elevated platelet count 07/11/2014   H/O: depression 05/16/2014   Pregnancy 05/04/2014   Asthma 04/13/2014   Pre-existing hypertension during pregnancy in first trimester 04/13/2014    Past Surgical History:  Procedure Laterality Date   BREAST BIOPSY Left 2017 nov   desmoid tumor benign    BREAST EXCISIONAL BIOPSY Left    CHOLECYSTECTOMY  2010   DG BARIUM SWALLOW ([facility] HX)  08/27/2015   ESOPHAGOGASTRODUODENOSCOPY (EGD) WITH PROPOFOL  N/A 08/17/2017   Procedure: ESOPHAGOGASTRODUODENOSCOPY (EGD) WITH PROPOFOL ;  Surgeon: Jacolyn Matar, MD;  Location: [facility];  Service: General;  Laterality: N/A;   FLEXIBLE SIGMOIDOSCOPY  as teenager   HIGH RISK BREAST EXCISION     left breast exc bx 2017 desmoid tumor   HYSTEROSCOPY WITH D & C N/A 07/18/2015   Procedure: DILATATION AND CURETTAGE /HYSTEROSCOPY;  Surgeon: Thora Flint, MD;  Location: [facility];  Service: Gynecology;  Laterality: N/A;   HYSTEROSCOPY WITH D & C N/A 11/07/2020   Procedure: DILATATION AND CURETTAGE /HYSTEROSCOPY WITH MYOSURE;  Surgeon: Suzi Essex, MD;  Location: [facility];  Service: Gynecology;  Laterality: N/A;   INTRAUTERINE DEVICE (IUD) INSERTION N/A 07/18/2015   Procedure: INTRAUTERINE DEVICE (IUD) INSERTION;  Surgeon: Thora Flint, MD;  Location: [facility];  Service: Gynecology;  Laterality: N/A;   INTRAUTERINE DEVICE (IUD) INSERTION N/A 11/07/2020    Procedure: INTRAUTERINE DEVICE (IUD) INSERTION MIRENA ;  Surgeon: Suzi Essex, MD;  Location: [facility];  Service: Gynecology;  Laterality: N/A;   LAPAROSCOPIC ROUX-EN-Y GASTRIC BYPASS WITH HIATAL HERNIA REPAIR  08/27/2015   Procedure: LAPAROSCOPIC ROUX-EN-Y GASTRIC BYPASS WITH HIATAL HERNIA REPAIR;  Surgeon: Jacolyn Matar, MD;  Location: [facility];  Service: General;;   LAPAROSCOPIC VAGINAL HYSTERECTOMY WITH SALPINGECTOMY Bilateral 04/09/2023   Procedure: LAPAROSCOPIC ASSISTED VAGINAL HYSTERECTOMY WITH SALPINGECTOMY;  Surgeon: Thora Flint, MD;  Location: [facility];  Service: Gynecology;  Laterality: Bilateral;   ORIF ANKLE FRACTURE Right    OVARIAN CYST REMOVAL     RADIOACTIVE SEED GUIDED EXCISIONAL BREAST BIOPSY Left 09/03/2016   Procedure: LEFT RADIOACTIVE SEED GUIDED EXCISIONAL BREAST BIOPSY;  Surgeon: Jacolyn Matar, MD;  Location: [facility];  Service: General;  Laterality: Left;   TONSILLECTOMY AND ADENOIDECTOMY     UPPER GI ENDOSCOPY  08/27/2015   Procedure: UPPER GI ENDOSCOPY;  Surgeon: Jacolyn Matar, MD;  Location: [facility];  Service: General;;    OB History     Gravida  3   Para  1   Term  1   Preterm  0   AB  1   Living  1      SAB  0   IAB  0   Ectopic  0   Multiple  0   Live Births  1            Home Medications    Prior to Admission medications   Medication Sig Start Date End Date Taking? Authorizing Provider  minoxidil  (LONITEN ) 2.5 MG tablet 1 tablet Orally once a day for 30 days 02/19/24  Yes [provider]  acetaminophen  (TYLENOL ) 500 MG tablet Take 1,000 mg by mouth every 6 (six) hours as needed for moderate pain or headache.    [provider]  albuterol  (VENTOLIN  HFA) 108 (90 Base) MCG/ACT inhaler Inhale 1-2 puffs into the lungs every 4 (four) hours as needed for wheezing or shortness of breath. 11/08/20   [provider]  Ascorbic Acid (VITAMIN C) 100 MG tablet Take 100 mg by mouth  daily.    [provider]  Atogepant  (QULIPTA ) 60 MG TABS Take 1 tablet (60 mg total) by mouth daily. 02/08/24     B Complex Vitamins (VITAMIN B COMPLEX) TABS 1 tablet Orally once a day    [provider]  buPROPion  (WELLBUTRIN  XL) 150 MG 24 hr tablet Take 150 mg by mouth daily.    [provider]  buPROPion  (WELLBUTRIN  XL) 150 MG 24 hr tablet Take 1 tablet (150 mg total) by mouth daily. 06/29/23     busPIRone  (BUSPAR ) 15 MG tablet Take 1 tablet (15 mg total) by mouth 2 (two) times daily. 06/30/23     busPIRone  (BUSPAR ) 7.5 MG tablet Take 7.5 mg by mouth 2 (two) times daily.    [provider]  Calcium Carb-Cholecalciferol (CALCIUM 1000 + D) 1000-20 MG-MCG TABS Take 600 mg by mouth.    [provider]  CALCIUM CITRATE PO Take 500 mg by mouth daily.    [provider]  diclofenac Sodium (VOLTAREN) 1 % GEL Apply 2 g topically 4 (four) times daily.    [provider]  DULoxetine  (CYMBALTA ) 60 MG capsule Take 60 mg by mouth daily.    [provider]  DULoxetine  (CYMBALTA ) 60 MG capsule Take 1 capsule (60 mg total) by mouth daily. 07/09/23     fexofenadine (ALLEGRA) 180 MG tablet Take 180 mg by mouth daily.    [provider]  gabapentin  (NEURONTIN ) 300 MG capsule Take 300 mg by mouth daily. 12/21/22   [provider]  gabapentin  (NEURONTIN ) 300 MG capsule Take 1 capsule (300 mg total) by mouth daily. 05/20/23     linaclotide  (LINZESS ) 290 MCG CAPS capsule Take 290 mcg by mouth daily before breakfast.    [provider]  linaclotide  (LINZESS ) 290 MCG CAPS capsule Take 1 capsule (290 mcg total) by mouth daily  at least 30 minutes before the first meal of the day on an empty stomach 06/08/23     linaclotide  (LINZESS ) 290 MCG CAPS capsule Take 1 capsule (290 mcg total) by mouth daily at least 30 minutes before the first meal of the day on an empty stomach. 06/29/23     lisdexamfetamine (VYVANSE ) 40 MG capsule Take 60 mg  by mouth.  [provider]  lisdexamfetamine (VYVANSE ) 60 MG capsule Take 1 capsule (60 mg total) by mouth in the morning. 02/08/24     LORazepam  (ATIVAN ) 0.5 MG tablet Take 0.25 mg by mouth 2 (two) times daily as needed for anxiety.    [provider]  LORazepam  (ATIVAN ) 0.5 MG tablet Take 1 tablet (0.5 mg total) by mouth daily as needed for anxiety 05/20/23     LORazepam  (ATIVAN ) 0.5 MG tablet Take 1 tablet (0.5 mg total) by mouth daily as needed for anxiety. 01/14/24     Magnesium  Bisglycinate (MAG GLYCINATE) 100 MG TABS Take 100 mg by mouth.    [provider]  meloxicam  (MOBIC ) 15 MG tablet Take 1 tablet (15 mg total) by mouth daily as needed for pain. Patient taking differently: Take 15 mg by mouth daily as needed for pain. 10/16/22     meloxicam  (MOBIC ) 15 MG tablet Take 1 tablet (15 mg total) by mouth daily as needed. 09/28/23     minoxidil  (LONITEN ) 2.5 MG tablet Take 1 tablet (2.5 mg total) by mouth daily. 02/19/24     Multiple Vitamins-Minerals (MULTIVITAMIN & MINERAL PO) Take 1 tablet by mouth 2 (two) times daily.     [provider]  omeprazole (PRILOSEC) 20 MG capsule Take 20 mg by mouth daily.    [provider]  pravastatin  (PRAVACHOL ) 20 MG tablet Take 1 tablet (20 mg total) by mouth every evening. 03/20/23   Hassan Links, MD  promethazine  (PHENERGAN ) 25 MG tablet Take 25 mg by mouth 2 (two) times daily as needed for nausea. 10/29/20   [provider]  spironolactone  (ALDACTONE ) 50 MG tablet Take 1 tablet (50 mg total) by mouth daily. 04/06/23     spironolactone  (ALDACTONE ) 50 MG tablet Take 1 tablet (50 mg total) by mouth daily. 06/29/23     tirzepatide  (MOUNJARO ) 10 MG/0.5ML Pen Inject 10 mg into the skin once a week. 01/01/23     tirzepatide  (MOUNJARO ) 10 MG/0.5ML Pen Inject 10mg  under the skin once weekly. (Sending multiple doses d/t shortage) 30 days Patient taking differently: Inject 10 mg into the skin once a week. 03/12/23      tirzepatide  (MOUNJARO ) 12.5 MG/0.5ML Pen Inject 12.5 mg into the skin once a week. 10/26/23     tirzepatide  (MOUNJARO ) 12.5 MG/0.5ML Pen Inject 12.5 mg into the skin once a week. 01/14/24     tirzepatide  (MOUNJARO ) 15 MG/0.5ML Pen Inject 15mg  under the skin once weekly. 03/12/23     tirzepatide  (MOUNJARO ) 15 MG/0.5ML Pen Inject 15 mg (0.5 mLs) into the skin once a week. 02/24/24     Topiramate  ER (TROKENDI  XR) 50 MG CP24 Take 1 capsule (50 mg total) by mouth daily. 07/09/23     traZODone  (DESYREL ) 100 MG tablet Take 1 tablet (100 mg total) by mouth at bedtime. 11/25/21   Jonn Nett, DO  traZODone  (DESYREL ) 100 MG tablet Take 1 tablet (100 mg total) by mouth at bedtime as needed for sleep 06/29/23     VITAMIN E PO Take 100 Units by mouth daily.    [provider]    Family History Family History  Problem Relation Age of Onset   Osteoarthritis Mother    Diabetes Mother    Hypertension Mother    Migraines Mother    Kidney disease Mother    Depression Mother    Anxiety disorder Mother    Sleep apnea Mother    Obesity Mother    Obesity Father  Anxiety disorder Father    Depression Father    Kidney disease Father    Osteoarthritis Father    Diabetes Father    Heart failure Father    Hypertension Father    Heart disease Father    Liver disease Father    Other Maternal Grandmother        thinks may have had uterine cancer - had hysterectomy at age 18   Breast cancer Neg Hx    Ovarian cancer Neg Hx    Colon cancer Neg Hx     Social History Social History   Tobacco Use   Smoking status: Never   Smokeless tobacco: Never  Vaping Use   Vaping status: Never Used  Substance Use Topics   Alcohol use: No   Drug use: No     Allergies   Hydrochlorothiazide , Morphine , Aspirin, Nsaids, and Tape   Review of Systems Review of Systems  Constitutional:  Negative for chills and fever.  HENT:  Negative for ear pain and sore throat.   Eyes:  Negative for pain and visual  disturbance.  Respiratory:  Negative for cough and shortness of breath.   Cardiovascular:  Negative for chest pain and palpitations.  Gastrointestinal:  Positive for abdominal pain. Negative for constipation, diarrhea, nausea and vomiting.  Genitourinary:  Negative for dysuria and hematuria.  Musculoskeletal:  Negative for arthralgias and back pain.  Skin:  Negative for color change and rash.  Neurological:  Negative for seizures and syncope.  All other systems reviewed and are negative.    Physical Exam Triage Vital Signs ED Triage Vitals  Encounter Vitals Group     BP 02/29/24 1344 126/86     Systolic BP Percentile --      Diastolic BP Percentile --      Pulse Rate 02/29/24 1344 74     Resp 02/29/24 1344 18     Temp 02/29/24 1344 98.2 F (36.8 C)     Temp Source 02/29/24 1344 Oral     SpO2 02/29/24 1344 99 %     Weight --      Height --      Head Circumference --      Peak Flow --      Pain Score 02/29/24 1339 6     Pain Loc --      Pain Education --      Exclude from Growth Chart --    No data found.  Updated Vital Signs BP 126/86 (BP Location: Right Arm)   Pulse 74   Temp 98.2 F (36.8 C) (Oral)   Resp 18   LMP 03/19/2023 (Approximate) Comment: irregular periods  SpO2 99%   Visual Acuity Right Eye Distance:   Left Eye Distance:   Bilateral Distance:    Right Eye Near:   Left Eye Near:    Bilateral Near:     Physical Exam Vitals and nursing note reviewed.  Constitutional:      General: She is not in acute distress.    Appearance: She is well-developed. She is not ill-appearing or toxic-appearing.  HENT:     Head: Normocephalic and atraumatic.     Right Ear: Hearing, tympanic membrane, ear canal and external ear normal.     Left Ear: Hearing, tympanic membrane, ear canal and external ear normal.     Nose: No congestion or rhinorrhea.     Right Sinus: No maxillary sinus tenderness or frontal sinus tenderness.     Left Sinus: No maxillary sinus  tenderness or frontal sinus tenderness.     Mouth/Throat:     Lips: Pink.     Mouth: Mucous membranes are moist.     Pharynx: Uvula midline. No oropharyngeal exudate or posterior oropharyngeal erythema.     Tonsils: No tonsillar exudate.  Eyes:     Conjunctiva/sclera: Conjunctivae normal.     Pupils: Pupils are equal, round, and reactive to light.  Cardiovascular:     Rate and Rhythm: Normal rate and regular rhythm.     Heart sounds: S1 normal and S2 normal. No murmur heard. Pulmonary:     Effort: Pulmonary effort is normal. No respiratory distress.     Breath sounds: Normal breath sounds. No decreased breath sounds, wheezing, rhonchi or rales.  Abdominal:     General: Bowel sounds are normal.     Palpations: Abdomen is soft.     Tenderness: There is abdominal tenderness in the right lower quadrant and epigastric area. There is guarding. There is no right CVA tenderness, left CVA tenderness or rebound. Negative signs include Murphy's sign, Rovsing's sign and McBurney's sign.  Musculoskeletal:        General: No swelling.     Cervical back: Neck supple.  Lymphadenopathy:     Head:     Right side of head: No submental, submandibular, tonsillar, preauricular or posterior auricular adenopathy.     Left side of head: No submental, submandibular, tonsillar, preauricular or posterior auricular adenopathy.     Cervical: No cervical adenopathy.     Right cervical: No superficial cervical adenopathy.    Left cervical: No superficial cervical adenopathy.  Skin:    General: Skin is warm and dry.     Capillary Refill: Capillary refill takes less than 2 seconds.     Findings: No rash.  Neurological:     Mental Status: She is alert and oriented to person, place, and time.  Psychiatric:        Mood and Affect: Mood normal.      UC Treatments / Results  Labs (all labs ordered are listed, but only abnormal results are displayed) Labs Reviewed  I-STAT CREATININE (MANUAL ENTRY) - Abnormal;  Notable for the following components:      Result Value   Creatinine, Ser 1.20 (*)    All other components within normal limits  POCT URINALYSIS DIP (MANUAL ENTRY) - Normal  POCT URINE PREGNANCY - Normal    EKG   Radiology CT ABDOMEN PELVIS W CONTRAST Result Date: 02/29/2024 CLINICAL DATA:  RLQ abdominal pain Rule in/out Appendacitis, Kidney Stones, Ovarian Cyst. EXAM: CT ABDOMEN AND PELVIS WITH CONTRAST TECHNIQUE: Multidetector CT imaging of the abdomen and pelvis was performed using the standard protocol following bolus administration of intravenous contrast. RADIATION DOSE REDUCTION: This exam was performed according to the departmental dose-optimization program which includes automated exposure control, adjustment of the mA and/or kV according to patient size and/or use of iterative reconstruction technique. CONTRAST:  OMNIPAQUE  IOHEXOL  300 MG/ML  SOLN COMPARISON:  CT urogram from 06/11/2015. FINDINGS: Lower chest: The lung bases are clear. No pleural effusion. The heart is normal in size. No pericardial effusion. Hepatobiliary: The liver is normal in size. Non-cirrhotic configuration. No suspicious mass. No intrahepatic or extrahepatic bile duct dilation. Gallbladder is surgically absent. Pancreas: Unremarkable. No pancreatic ductal dilatation or surrounding inflammatory changes. Spleen: Within normal limits. No focal lesion. Adrenals/Urinary Tract: Adrenal glands are unremarkable. No suspicious renal mass. Several sinus cysts noted in the left kidney. No hydronephrosis. No renal or ureteric  calculi. Unremarkable urinary bladder. Stomach/Bowel: Postsurgical changes from prior gastric bypass noted no disproportionate dilation of the small or large bowel loops. No evidence of abnormal bowel wall thickening or inflammatory changes. The appendix is unremarkable. Vascular/Lymphatic: No ascites or pneumoperitoneum. No abdominal or pelvic lymphadenopathy, by size criteria. No aneurysmal dilation of  the major abdominal arteries. Reproductive: The uterus is surgically absent. There is a well-circumscribed 3.4 x 3.9 cm hypoattenuating structure in the left adnexa and a 2.2 x 2.7 cm hypoattenuating structure in the right adnexa. These are incompletely characterized on the current exam but favored ovarian in etiology. No abnormal wall thickening or surrounding fat stranding noted. Correlate clinically to determine the need for further characterisation with ultrasound. Other: There is a tiny fat containing umbilical hernia. The soft tissues and abdominal wall are otherwise unremarkable. Musculoskeletal: No suspicious osseous lesions. There are mild multilevel degenerative changes in the visualized spine. IMPRESSION: 1. No acute inflammatory process identified within the abdomen or pelvis. Unremarkable appendix. 2. No nephroureterolithiasis or obstructive uropathy. 3. There are bilateral hypoattenuating adnexal lesions, favored ovarian in etiology. Correlate clinically to determine the need for further characterisation with ultrasound. 4. Multiple other nonacute observations, as described above. Electronically Signed   By: Beula Brunswick M.D.   On: 02/29/2024 16:43   DG Abd 1 View Result Date: 02/29/2024 CLINICAL DATA:  Right lower quadrant abdominal pain EXAM: ABDOMEN - 1 VIEW COMPARISON:  None Available. FINDINGS: The bowel gas pattern is normal. No radio-opaque calculi or other significant radiographic abnormality are seen. IMPRESSION: Negative. Prior cholecystectomy, surgical staple line in the left upper quadrant region of the abdomen Electronically Signed   By: Fredrich Jefferson M.D.   On: 02/29/2024 15:10    Procedures Procedures (including critical care time)  Medications Ordered in UC Medications  iohexol  (OMNIPAQUE ) 300 MG/ML solution 100 mL (100 mLs Intravenous Contrast Given 02/29/24 1537)    Initial Impression / Assessment and Plan / UC Course  I have reviewed the triage vital signs and the  nursing notes.  Pertinent labs & imaging results that were available during my care of the patient were reviewed by me and considered in my medical decision making (see chart for details).     Plan of Care: Abdominal pain, abdominal guarding, history of ovarian cysts: Urine was negative.  Urine pregnancy was negative.  Abdominal x-ray showed some stool in the colon but was otherwise negative.  CT scan of the abdomen and pelvis with IV contrast was negative for any acute findings and negative for stones.  She did have some questionable areas in the lower pelvis that are probably her ovaries.  Encouraged to follow-up with gynecology for transvaginal ultrasound to work those up if the pain persists.  Encouraged to get empty of stool and see if that improves her pain.  Follow-up here or in emergency room if symptoms do not improve, worsen or new symptoms occur.  The visit took over an hour due to workup, discussion of test results and time for prior authorization of CT scan which in the end did not require prior authorization but we had to go through the process to determine that. Final Clinical Impressions(s) / UC Diagnoses   Final diagnoses:  Abdominal pain, epigastric  Right lower quadrant abdominal pain  History of ovarian cyst  RLQ guarding     Discharge Instructions      Abdominal pain: Urine pregnancy test was negative.  Urinalysis was normal.  Abdominal x-ray showed some stool in the colon  but was otherwise normal.  CT scan does not find anything acutely abnormal in the abdomen.  No kidney stones were seen.  Encouraged to try some laxatives to get empty of stool.  Follow-up with primary care if symptoms do not improve, worsen or new symptoms occur.   ED Prescriptions   None    PDMP not reviewed this encounter.   Guss Legacy, FNP 02/29/24 1657    Guss Legacy, FNP 02/29/24 [phone]

## 2024-02-29 NOTE — ED Notes (Signed)
 Called [employer] for pre certification for CT Abdomen/Pelvis with contrast.  CPT [phone] ICD 10 code: R10.31 Symptoms: RLQ pain w/guarding, epigastric pain, Hx of ovarian cysts. Call back number: [phone] Agent's name: Jeanetta Milian Call reference number [id number] Per Jeanetta Milian no pre certification is needed for this test.

## 2024-03-13 ENCOUNTER — Other Ambulatory Visit (HOSPITAL_COMMUNITY): Payer: Self-pay

## 2024-03-15 ENCOUNTER — Other Ambulatory Visit: Payer: Self-pay

## 2024-03-15 ENCOUNTER — Other Ambulatory Visit: Payer: Self-pay | Admitting: Obstetrics and Gynecology

## 2024-03-15 ENCOUNTER — Other Ambulatory Visit (HOSPITAL_COMMUNITY): Payer: Self-pay

## 2024-03-15 MED ORDER — MINOXIDIL 2.5 MG PO TABS
2.5000 mg | ORAL_TABLET | Freq: Every day | ORAL | 0 refills | Status: DC
  Filled 2024-03-15: qty 90, 90d supply, fill #0

## 2024-03-16 LAB — SURGICAL PATHOLOGY

## 2024-03-24 ENCOUNTER — Other Ambulatory Visit (HOSPITAL_COMMUNITY): Payer: Self-pay

## 2024-03-24 ENCOUNTER — Other Ambulatory Visit: Payer: Self-pay

## 2024-03-25 ENCOUNTER — Other Ambulatory Visit (HOSPITAL_COMMUNITY): Payer: Self-pay

## 2024-03-25 MED ORDER — LISDEXAMFETAMINE DIMESYLATE 60 MG PO CAPS
60.0000 mg | ORAL_CAPSULE | Freq: Every morning | ORAL | 0 refills | Status: DC
  Filled 2024-03-25: qty 30, 30d supply, fill #0

## 2024-04-11 DIAGNOSIS — Z90722 Acquired absence of ovaries, bilateral: Secondary | ICD-10-CM | POA: Insufficient documentation

## 2024-04-11 DIAGNOSIS — Z90710 Acquired absence of both cervix and uterus: Secondary | ICD-10-CM | POA: Insufficient documentation

## 2024-04-11 DIAGNOSIS — G4733 Obstructive sleep apnea (adult) (pediatric): Secondary | ICD-10-CM | POA: Insufficient documentation

## 2024-04-21 ENCOUNTER — Other Ambulatory Visit: Payer: Self-pay

## 2024-04-21 ENCOUNTER — Other Ambulatory Visit (HOSPITAL_COMMUNITY): Payer: Self-pay

## 2024-04-26 ENCOUNTER — Other Ambulatory Visit (HOSPITAL_COMMUNITY): Payer: Self-pay

## 2024-04-26 MED ORDER — LISDEXAMFETAMINE DIMESYLATE 60 MG PO CAPS
60.0000 mg | ORAL_CAPSULE | Freq: Every day | ORAL | 0 refills | Status: DC
  Filled 2024-04-26: qty 30, 30d supply, fill #0

## 2024-05-18 LAB — OPHTHALMOLOGY REPORT-SCANNED

## 2024-05-23 ENCOUNTER — Other Ambulatory Visit (HOSPITAL_COMMUNITY): Payer: Self-pay

## 2024-05-23 MED ORDER — QULIPTA 60 MG PO TABS
1.0000 | ORAL_TABLET | Freq: Every day | ORAL | 0 refills | Status: DC
  Filled 2024-05-23: qty 30, 30d supply, fill #0
  Filled 2024-07-05: qty 30, 30d supply, fill #1
  Filled 2024-08-01: qty 30, 30d supply, fill #2

## 2024-05-27 ENCOUNTER — Other Ambulatory Visit (HOSPITAL_COMMUNITY): Payer: Self-pay

## 2024-05-27 ENCOUNTER — Other Ambulatory Visit: Payer: Self-pay

## 2024-05-30 ENCOUNTER — Other Ambulatory Visit (HOSPITAL_COMMUNITY): Payer: Self-pay

## 2024-05-30 MED ORDER — LISDEXAMFETAMINE DIMESYLATE 60 MG PO CAPS
60.0000 mg | ORAL_CAPSULE | Freq: Every morning | ORAL | 0 refills | Status: DC
  Filled 2024-05-30: qty 30, 30d supply, fill #0

## 2024-05-31 ENCOUNTER — Other Ambulatory Visit (HOSPITAL_COMMUNITY): Payer: Self-pay

## 2024-06-02 ENCOUNTER — Other Ambulatory Visit (HOSPITAL_COMMUNITY): Payer: Self-pay

## 2024-06-03 DIAGNOSIS — M545 Low back pain, unspecified: Secondary | ICD-10-CM | POA: Insufficient documentation

## 2024-06-11 ENCOUNTER — Other Ambulatory Visit (HOSPITAL_COMMUNITY): Payer: Self-pay

## 2024-06-11 MED ORDER — UBRELVY 100 MG PO TABS
100.0000 mg | ORAL_TABLET | ORAL | 0 refills | Status: DC | PRN
  Filled 2024-06-11 – 2024-06-24 (×2): qty 16, 30d supply, fill #0

## 2024-06-13 ENCOUNTER — Other Ambulatory Visit (HOSPITAL_COMMUNITY): Payer: Self-pay

## 2024-06-13 ENCOUNTER — Other Ambulatory Visit: Payer: Self-pay

## 2024-06-13 MED ORDER — MINOXIDIL 2.5 MG PO TABS
2.5000 mg | ORAL_TABLET | Freq: Every day | ORAL | 0 refills | Status: DC
  Filled 2024-06-13: qty 30, 30d supply, fill #0

## 2024-06-15 ENCOUNTER — Other Ambulatory Visit (HOSPITAL_COMMUNITY): Payer: Self-pay

## 2024-06-16 ENCOUNTER — Encounter (HOSPITAL_COMMUNITY): Payer: Self-pay

## 2024-06-16 ENCOUNTER — Other Ambulatory Visit (HOSPITAL_COMMUNITY): Payer: Self-pay

## 2024-06-24 ENCOUNTER — Other Ambulatory Visit (HOSPITAL_COMMUNITY): Payer: Self-pay

## 2024-06-28 ENCOUNTER — Other Ambulatory Visit (HOSPITAL_COMMUNITY): Payer: Self-pay

## 2024-06-28 MED ORDER — LISDEXAMFETAMINE DIMESYLATE 60 MG PO CAPS
60.0000 mg | ORAL_CAPSULE | Freq: Every morning | ORAL | 0 refills | Status: DC
  Filled 2024-06-28: qty 30, 30d supply, fill #0

## 2024-07-05 ENCOUNTER — Other Ambulatory Visit: Payer: Self-pay

## 2024-07-05 ENCOUNTER — Other Ambulatory Visit (HOSPITAL_COMMUNITY): Payer: Self-pay

## 2024-07-05 MED ORDER — SPIRONOLACTONE 50 MG PO TABS
50.0000 mg | ORAL_TABLET | Freq: Every day | ORAL | 0 refills | Status: DC
  Filled 2024-07-05: qty 30, 30d supply, fill #0

## 2024-07-05 MED ORDER — TRAZODONE HCL 100 MG PO TABS
100.0000 mg | ORAL_TABLET | Freq: Every day | ORAL | 0 refills | Status: DC
  Filled 2024-07-05: qty 30, 30d supply, fill #0

## 2024-07-10 ENCOUNTER — Other Ambulatory Visit (HOSPITAL_COMMUNITY): Payer: Self-pay

## 2024-07-11 ENCOUNTER — Other Ambulatory Visit (HOSPITAL_COMMUNITY): Payer: Self-pay

## 2024-07-11 MED ORDER — LINZESS 290 MCG PO CAPS
290.0000 ug | ORAL_CAPSULE | Freq: Every day | ORAL | 0 refills | Status: DC
  Filled 2024-07-11: qty 90, 90d supply, fill #0

## 2024-07-14 ENCOUNTER — Other Ambulatory Visit (HOSPITAL_COMMUNITY): Payer: Self-pay

## 2024-07-25 ENCOUNTER — Other Ambulatory Visit (HOSPITAL_COMMUNITY): Payer: Self-pay

## 2024-07-25 MED ORDER — MOUNJARO 15 MG/0.5ML ~~LOC~~ SOAJ
15.0000 mg | SUBCUTANEOUS | 0 refills | Status: DC
  Filled 2024-07-25 – 2024-08-09 (×2): qty 2, 28d supply, fill #0

## 2024-07-25 MED ORDER — BUPROPION HCL ER (XL) 150 MG PO TB24
ORAL_TABLET | ORAL | 0 refills | Status: DC
  Filled 2024-07-25: qty 90, 90d supply, fill #0

## 2024-07-25 MED ORDER — LISDEXAMFETAMINE DIMESYLATE 60 MG PO CAPS
60.0000 mg | ORAL_CAPSULE | Freq: Every morning | ORAL | 0 refills | Status: DC
  Filled 2024-07-25 – 2024-07-28 (×2): qty 30, 30d supply, fill #0

## 2024-07-28 ENCOUNTER — Other Ambulatory Visit: Payer: Self-pay

## 2024-07-28 ENCOUNTER — Other Ambulatory Visit (HOSPITAL_COMMUNITY): Payer: Self-pay

## 2024-08-01 ENCOUNTER — Other Ambulatory Visit (HOSPITAL_COMMUNITY): Payer: Self-pay

## 2024-08-01 ENCOUNTER — Encounter (HOSPITAL_COMMUNITY): Payer: Self-pay

## 2024-08-02 ENCOUNTER — Other Ambulatory Visit (HOSPITAL_COMMUNITY): Payer: Self-pay

## 2024-08-09 ENCOUNTER — Other Ambulatory Visit (HOSPITAL_COMMUNITY): Payer: Self-pay

## 2024-08-10 ENCOUNTER — Other Ambulatory Visit (HOSPITAL_COMMUNITY): Payer: Self-pay

## 2024-08-10 ENCOUNTER — Other Ambulatory Visit: Payer: Self-pay

## 2024-08-10 MED ORDER — SPIRONOLACTONE 50 MG PO TABS
50.0000 mg | ORAL_TABLET | Freq: Every day | ORAL | 3 refills | Status: AC
  Filled 2024-08-10: qty 30, 30d supply, fill #0
  Filled 2024-10-02: qty 30, 30d supply, fill #1

## 2024-08-10 MED ORDER — TRAZODONE HCL 100 MG PO TABS
100.0000 mg | ORAL_TABLET | Freq: Every evening | ORAL | 3 refills | Status: DC | PRN
  Filled 2024-08-10: qty 30, 30d supply, fill #0
  Filled 2024-10-17: qty 30, 30d supply, fill #1

## 2024-09-06 ENCOUNTER — Other Ambulatory Visit (HOSPITAL_COMMUNITY): Payer: Self-pay

## 2024-09-06 MED ORDER — QULIPTA 60 MG PO TABS
1.0000 | ORAL_TABLET | Freq: Every day | ORAL | 0 refills | Status: AC
  Filled 2024-09-06: qty 30, 30d supply, fill #0
  Filled 2024-10-02: qty 30, 30d supply, fill #1
  Filled 2024-11-06: qty 30, 30d supply, fill #2

## 2024-09-06 MED ORDER — LISDEXAMFETAMINE DIMESYLATE 60 MG PO CAPS
60.0000 mg | ORAL_CAPSULE | Freq: Every day | ORAL | 0 refills | Status: DC
  Filled 2024-09-06: qty 30, 30d supply, fill #0

## 2024-09-14 ENCOUNTER — Encounter: Payer: Self-pay | Admitting: Family Medicine

## 2024-09-14 ENCOUNTER — Ambulatory Visit (INDEPENDENT_AMBULATORY_CARE_PROVIDER_SITE_OTHER): Admitting: Family Medicine

## 2024-09-14 VITALS — BP 116/84 | HR 77 | Ht 67.0 in | Wt 243.6 lb

## 2024-09-14 DIAGNOSIS — F411 Generalized anxiety disorder: Secondary | ICD-10-CM

## 2024-09-14 DIAGNOSIS — L659 Nonscarring hair loss, unspecified: Secondary | ICD-10-CM

## 2024-09-14 DIAGNOSIS — F32A Depression, unspecified: Secondary | ICD-10-CM

## 2024-09-14 DIAGNOSIS — G43009 Migraine without aura, not intractable, without status migrainosus: Secondary | ICD-10-CM

## 2024-09-14 DIAGNOSIS — F430 Acute stress reaction: Secondary | ICD-10-CM

## 2024-09-14 DIAGNOSIS — E66812 Obesity, class 2: Secondary | ICD-10-CM

## 2024-09-14 DIAGNOSIS — N951 Menopausal and female climacteric states: Secondary | ICD-10-CM

## 2024-09-14 DIAGNOSIS — Z79899 Other long term (current) drug therapy: Secondary | ICD-10-CM

## 2024-09-14 DIAGNOSIS — Z6838 Body mass index (BMI) 38.0-38.9, adult: Secondary | ICD-10-CM

## 2024-09-14 DIAGNOSIS — F410 Panic disorder [episodic paroxysmal anxiety] without agoraphobia: Secondary | ICD-10-CM

## 2024-09-14 DIAGNOSIS — Z9884 Bariatric surgery status: Secondary | ICD-10-CM

## 2024-09-14 DIAGNOSIS — Z23 Encounter for immunization: Secondary | ICD-10-CM

## 2024-09-14 MED ORDER — DULOXETINE HCL 60 MG PO CPEP: 60.0000 mg | ORAL_CAPSULE | Freq: Every day | ORAL | 1 refills | Status: AC

## 2024-09-14 MED ORDER — BUSPIRONE HCL 15 MG PO TABS
15.0000 mg | ORAL_TABLET | Freq: Two times a day (BID) | ORAL | 1 refills | Status: AC
Start: 2024-09-14 — End: ?

## 2024-09-14 MED ORDER — LORAZEPAM 1 MG PO TABS: 1.0000 mg | ORAL_TABLET | Freq: Every day | ORAL | 0 refills | Status: AC | PRN

## 2024-09-14 MED ORDER — PROGESTERONE MICRONIZED 100 MG PO CAPS
100.0000 mg | ORAL_CAPSULE | Freq: Every day | ORAL | 1 refills | Status: AC
Start: 2024-09-14 — End: ?

## 2024-09-14 MED ORDER — PROMETHAZINE HCL 25 MG PO TABS: 25.0000 mg | ORAL_TABLET | Freq: Two times a day (BID) | ORAL | 2 refills | Status: AC | PRN

## 2024-09-14 MED ORDER — MINOXIDIL 2.5 MG PO TABS: 2.5000 mg | ORAL_TABLET | Freq: Two times a day (BID) | ORAL | 1 refills | Status: AC

## 2024-09-17 ENCOUNTER — Encounter: Payer: Self-pay | Admitting: Family Medicine

## 2024-09-17 DIAGNOSIS — Z6836 Body mass index (BMI) 36.0-36.9, adult: Secondary | ICD-10-CM | POA: Insufficient documentation

## 2024-09-17 DIAGNOSIS — M47816 Spondylosis without myelopathy or radiculopathy, lumbar region: Secondary | ICD-10-CM | POA: Insufficient documentation

## 2024-09-17 DIAGNOSIS — M533 Sacrococcygeal disorders, not elsewhere classified: Secondary | ICD-10-CM | POA: Insufficient documentation

## 2024-09-17 DIAGNOSIS — F410 Panic disorder [episodic paroxysmal anxiety] without agoraphobia: Secondary | ICD-10-CM | POA: Insufficient documentation

## 2024-09-17 DIAGNOSIS — M25571 Pain in right ankle and joints of right foot: Secondary | ICD-10-CM | POA: Insufficient documentation

## 2024-09-17 DIAGNOSIS — N951 Menopausal and female climacteric states: Secondary | ICD-10-CM | POA: Insufficient documentation

## 2024-09-17 DIAGNOSIS — L659 Nonscarring hair loss, unspecified: Secondary | ICD-10-CM | POA: Insufficient documentation

## 2024-09-17 DIAGNOSIS — Z6838 Body mass index (BMI) 38.0-38.9, adult: Secondary | ICD-10-CM | POA: Insufficient documentation

## 2024-09-17 NOTE — Progress Notes (Signed)
 Assessment & Plan:   1. Hair loss Comments: Tolerating Minoxidil  and seeing improvement. Asks to increase dose. Will increase to BID. - minoxidil  (LONITEN ) 2.5 MG tablet; Take 1 tablet (2.5 mg total) by mouth 2 (two) times daily.  Dispense: 180 tablet; Refill: 1  2. Migraine without aura and without status migrainosus, not intractable Comments: Refill phenergan  prn. Uses sparingly and at night only. - promethazine  (PHENERGAN ) 25 MG tablet; Take 1 tablet (25 mg total) by mouth 2 (two) times daily as needed for nausea.  Dispense: 30 tablet; Refill: 2  3. Generalized anxiety disorder Comments: Worsening with increased perimenopausal symptoms and work stress. Discussed considering progesterone  to see if it helps with sleep/anxiety. - busPIRone  (BUSPAR ) 15 MG tablet; Take 1 tablet (15 mg total) by mouth 2 (two) times daily.  Dispense: 180 tablet; Refill: 1 - DULoxetine  (CYMBALTA ) 60 MG capsule; Take 1 capsule (60 mg total) by mouth daily.  Dispense: 90 capsule; Refill: 1  4. Depressive disorder - DULoxetine  (CYMBALTA ) 60 MG capsule; Take 1 capsule (60 mg total) by mouth daily.  Dispense: 90 capsule; Refill: 1  5. Medication management - ondansetron  (ZOFRAN -ODT) 8 MG disintegrating tablet; Take 8 mg by mouth every 8 (eight) hours as needed.  6. Perimenopausal symptoms Comments: Followed by GYN. She will see them next month. Will start progesterone  to see if helps with anxiety and insomnia. - estradiol (VIVELLE-DOT) 0.1 MG/24HR patch; Place 1 patch onto the skin 2 (two) times a week. - estradiol (ESTRACE) 0.01 % CREA vaginal cream; Place 1 Applicatorful vaginally 2 (two) times a week. - progesterone  (PROMETRIUM ) 100 MG capsule; Take 1 capsule (100 mg total) by mouth at bedtime.  Dispense: 30 capsule; Refill: 1  7. Panic attack as reaction to stress Comments: Uses benzo sparingly. Feels that the current dose is not as effective. Discussed use as the last resort. Will work on controlling  overall anxiety and revisit. - LORazepam  (ATIVAN ) 1 MG tablet; Take 1 tablet (1 mg total) by mouth daily as needed for anxiety.  Dispense: 30 tablet; Refill: 0  8. Immunization due (Primary) - Flu vaccine trivalent PF, 6mos and older(Flulaval,Afluria,Fluarix,Fluzone)  9. 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  10. Class 2 severe obesity with serious comorbidity and body mass index (BMI) of 38.0 to 38.9 in adult, unspecified obesity type Comments: Doing very well. Significant weight loss.  [name], DO, MS, FAAFP, Dipl. [name] [address] [place], [address] Dept: [phone] Dept Fax: [phone]  Subjective:   Patient is well known to me from my previous clinic and is now establishing care with me as their primary care provider.  Past medical history, surgical history, allergies, family history, immunizations andmedications were updated in the EMR today and reviewed under the history and medication portions of their EMR. Review of Systems: Negative, with the exception of above mentioned in HPI.  Current Outpatient Medications:    acetaminophen  (TYLENOL ) 500 MG tablet, Take 1,000 mg by mouth every 6 (six) hours as needed for moderate pain or headache., Disp: , Rfl:    albuterol  (VENTOLIN  HFA) 108 (90 Base) MCG/ACT inhaler, Inhale 1-2 puffs into the lungs every 4 (four) hours as needed for wheezing or shortness of breath., Disp: , Rfl:    Atogepant  (QULIPTA ) 60 MG TABS, Take 1 tablet by mouth once daily., Disp: 90 tablet, Rfl: 0   B Complex Vitamins (VITAMIN B COMPLEX) TABS, 1 tablet Orally  once a day, Disp: , Rfl:    buPROPion  (WELLBUTRIN  XL) 150 MG 24 hr tablet, TAKE 1 TABLET BY MOUTH ONCE DAILY., Disp: 90 tablet, Rfl: 0   Calcium Carb-Cholecalciferol (CALCIUM 1000 + D) 1000-20 MG-MCG TABS, Take 600 mg by mouth., Disp: , Rfl:    estradiol (ESTRACE) 0.01 % CREA vaginal  cream, Place 1 Applicatorful vaginally 2 (two) times a week., Disp: , Rfl:    estradiol (VIVELLE-DOT) 0.1 MG/24HR patch, Place 1 patch onto the skin 2 (two) times a week., Disp: , Rfl:    fexofenadine (ALLEGRA) 180 MG tablet, Take 180 mg by mouth daily., Disp: , Rfl:    linaclotide  (LINZESS ) 290 MCG CAPS capsule, Take 1 capsule by mouth at least 30 minutes before the first meal of the day on an empty stomach, Disp: 90 capsule, Rfl: 0   lisdexamfetamine (VYVANSE ) 60 MG capsule, Take 1 capsule (60 mg total) by mouth daily., Disp: 30 capsule, Rfl: 0   Magnesium  Bisglycinate (MAG GLYCINATE) 100 MG TABS, Take 100 mg by mouth., Disp: , Rfl:    Multiple Vitamins-Minerals (MULTIVITAMIN & MINERAL PO), Take 1 tablet by mouth 2 (two) times daily. , Disp: , Rfl:    ondansetron  (ZOFRAN -ODT) 8 MG disintegrating tablet, Take 8 mg by mouth every 8 (eight) hours as needed., Disp: , Rfl:    progesterone  (PROMETRIUM ) 100 MG capsule, Take 1 capsule (100 mg total) by mouth at bedtime., Disp: 30 capsule, Rfl: 1   spironolactone  (ALDACTONE ) 50 MG tablet, Take 1 tablet (50 mg total) by mouth daily., Disp: 30 tablet, Rfl: 3   tirzepatide  (MOUNJARO ) 15 MG/0.5ML Pen, Inject 15 mg into the skin once a week., Disp: 2 mL, Rfl: 0   traZODone  (DESYREL ) 100 MG tablet, Take 1 tablet (100 mg total) by mouth at bedtime as needed for sleep, Disp: 30 tablet, Rfl: 3   Ubrogepant  (UBRELVY ) 100 MG TABS, Take 1 tablet (100 mg total) by mouth as needed for migraine. May repeat after 2 hours if needed, max 2 doses in 24 hours., Disp: 16 tablet, Rfl: 0   UNABLE TO FIND, Testosterone cream 4% 2 times weekly to inner thigh, Disp: , Rfl:    busPIRone  (BUSPAR ) 15 MG tablet, Take 1 tablet (15 mg total) by mouth 2 (two) times daily., Disp: 180 tablet, Rfl: 1   DULoxetine  (CYMBALTA ) 60 MG capsule, Take 1 capsule (60 mg total) by mouth daily., Disp: 90 capsule, Rfl: 1   LORazepam  (ATIVAN ) 1 MG tablet, Take 1 tablet (1 mg total) by mouth daily as needed  for anxiety., Disp: 30 tablet, Rfl: 0   minoxidil  (LONITEN ) 2.5 MG tablet, Take 1 tablet (2.5 mg total) by mouth 2 (two) times daily., Disp: 180 tablet, Rfl: 1   pravastatin  (PRAVACHOL ) 20 MG tablet, Take 1 tablet (20 mg total) by mouth every evening. (Patient not taking: Reported on 09/14/2024), Disp: 90 tablet, Rfl: 3   promethazine  (PHENERGAN ) 25 MG tablet, Take 1 tablet (25 mg total) by mouth 2 (two) times daily as needed for nausea., Disp: 30 tablet, Rfl: 2   VITAMIN E PO, Take 100 Units by mouth daily. (Patient not taking: Reported on 09/14/2024), Disp: , Rfl:    Objective:   BP 116/84 (BP Location: Right Arm, Cuff Size: Large)   Pulse 77   Ht 5' 7 (1.702 m)   Wt 243 lb 9.6 oz (110.5 kg)   LMP 03/19/2023 (Approximate) Comment: irregular periods  SpO2 99%   BMI 38.15 kg/m   Wt Readings from  Last 3 Encounters:  09/14/24 243 lb 9.6 oz (110.5 kg)  04/16/23 258 lb 3.2 oz (117.1 kg)  04/09/23 264 lb 4.8 oz (119.9 kg)   Physical Exam Constitutional:      General: She is not in acute distress.    Appearance: She is well-developed. She is obese.  HENT:     Head: Normocephalic and atraumatic.  Eyes:     Conjunctiva/sclera: Conjunctivae normal.  Cardiovascular:     Rate and Rhythm: Normal rate and regular rhythm.     Heart sounds: Normal heart sounds.  Pulmonary:     Effort: Pulmonary effort is normal.     Breath sounds: Normal breath sounds.  Musculoskeletal:     Cervical back: Normal range of motion and neck supple.  Neurological:     General: No focal deficit present.     Mental Status: She is alert and oriented to person, place, and time.  Psychiatric:        Mood and Affect: Mood normal.        Behavior: Behavior normal.    Note: Other outside labs reviewed and will be abstracted. Below labs are the most recent in local EPIC.  Lab Results  Component Value Date   CREATININE 1.20 (A) 02/29/2024   BUN 15 04/06/2023   NA 138 04/06/2023   K 4.1 04/06/2023   CL 110  04/06/2023   CO2 23 04/06/2023   Lab Results  Component Value Date   ALT 18 04/06/2023   AST 23 04/06/2023   ALKPHOS 53 04/06/2023   BILITOT 0.5 04/06/2023   Lab Results  Component Value Date   HGBA1C 7.7 (H) 05/02/2021   HGBA1C 6.1 (H) 10/29/2020   HGBA1C 7.2 (H) 08/27/2015   Lab Results  Component Value Date   INSULIN  17.8 05/02/2021   Lab Results  Component Value Date   TSH 3.530 05/02/2021   Lab Results  Component Value Date   CHOL 229 (H) 05/02/2021   HDL 65 05/02/2021   LDLCALC 145 (H) 05/02/2021   TRIG 108 05/02/2021   Lab Results  Component Value Date   VD25OH 44.5 05/02/2021   Lab Results  Component Value Date   WBC 7.7 04/10/2023   HGB 10.4 (L) 04/10/2023   HCT 31.8 (L) 04/10/2023   MCV 92.4 04/10/2023   PLT 319 04/10/2023   Lab Results  Component Value Date   IRON 68 05/02/2021   TIBC 243 (L) 05/02/2021   FERRITIN 91 05/02/2021   I personally spent a total of 45 minutes in the care of the patient today including preparing to see the patient, performing a medically appropriate exam/evaluation, counseling and educating, placing orders, and documenting clinical information in the EHR.

## 2024-10-03 MED ORDER — LISDEXAMFETAMINE DIMESYLATE 60 MG PO CAPS: 60.0000 mg | ORAL_CAPSULE | Freq: Every day | ORAL | 0 refills | Status: DC

## 2024-10-06 ENCOUNTER — Other Ambulatory Visit: Payer: Self-pay | Admitting: Family Medicine

## 2024-10-06 DIAGNOSIS — N951 Menopausal and female climacteric states: Secondary | ICD-10-CM

## 2024-10-06 NOTE — Telephone Encounter (Signed)
 Refill request received for 90 day script of Progesterone  100mg  FOV:011/04/2025 LOV:09/14/2024 Last refill: 09/14/2024 Medication quantity change pending your approval.

## 2024-10-18 ENCOUNTER — Other Ambulatory Visit (HOSPITAL_COMMUNITY): Payer: Self-pay

## 2024-10-18 MED ORDER — LINACLOTIDE 290 MCG PO CAPS: 290.0000 ug | ORAL_CAPSULE | Freq: Every day | ORAL | 0 refills | Status: AC

## 2024-10-20 ENCOUNTER — Other Ambulatory Visit: Payer: Self-pay | Admitting: Medical Genetics

## 2024-10-26 ENCOUNTER — Ambulatory Visit: Admitting: Family Medicine

## 2024-10-31 ENCOUNTER — Ambulatory Visit: Admitting: Family Medicine

## 2024-10-31 ENCOUNTER — Encounter: Payer: Self-pay | Admitting: Family Medicine

## 2024-10-31 VITALS — Ht 67.0 in | Wt 236.2 lb

## 2024-10-31 DIAGNOSIS — Z9884 Bariatric surgery status: Secondary | ICD-10-CM

## 2024-10-31 DIAGNOSIS — G43009 Migraine without aura, not intractable, without status migrainosus: Secondary | ICD-10-CM

## 2024-10-31 DIAGNOSIS — Z006 Encounter for examination for normal comparison and control in clinical research program: Secondary | ICD-10-CM

## 2024-10-31 DIAGNOSIS — E66812 Obesity, class 2: Secondary | ICD-10-CM

## 2024-10-31 DIAGNOSIS — L732 Hidradenitis suppurativa: Secondary | ICD-10-CM

## 2024-10-31 DIAGNOSIS — Z6836 Body mass index (BMI) 36.0-36.9, adult: Secondary | ICD-10-CM

## 2024-10-31 DIAGNOSIS — F900 Attention-deficit hyperactivity disorder, predominantly inattentive type: Secondary | ICD-10-CM

## 2024-10-31 DIAGNOSIS — F5104 Psychophysiologic insomnia: Secondary | ICD-10-CM

## 2024-10-31 DIAGNOSIS — N951 Menopausal and female climacteric states: Secondary | ICD-10-CM

## 2024-10-31 DIAGNOSIS — E119 Type 2 diabetes mellitus without complications: Secondary | ICD-10-CM

## 2024-10-31 MED ORDER — LISDEXAMFETAMINE DIMESYLATE 60 MG PO CAPS: 60.0000 mg | ORAL_CAPSULE | Freq: Every day | ORAL | 0 refills | Status: AC

## 2024-10-31 MED ORDER — TRAZODONE HCL 100 MG PO TABS: 100.0000 mg | ORAL_TABLET | Freq: Every day | ORAL | 3 refills | Status: AC

## 2024-10-31 MED ORDER — UBRELVY 100 MG PO TABS: 100.0000 mg | ORAL_TABLET | ORAL | 0 refills | Status: AC | PRN

## 2024-10-31 NOTE — Progress Notes (Incomplete)
 "    Patient Care Team: Prentiss Frieze, DO as PCP - General (Family Medicine) Monetta Redell PARAS, MD as PCP - Cardiology (Cardiology) Harl Jayson CROME, MD (Inactive) (Family Medicine)  Weight Management:     Starting weight: 345 lbs Starting date: 04/30/2021 Today's weight: 236.2lb Today's date: 10/31/2024 Total lbs lost to date: 109 lbs Total lbs lost since last in-office visit: 6 Total weight loss percentage to date: -31.59% Body mass index is 36.99 kg/m.   Diagnoses and Orders:   1. Research study patient    No orders of the defined types were placed in this encounter.  Orders Placed This Encounter  Procedures   [facility] Molecular Screen - Blood   Assessment & Plan:   Assessment and Plan Assessment & Plan     Frieze Prentiss, DO, MS, FAAFP, Dipl. [name] [address] [place], [address] Dept: [phone] Dept Fax: [phone]  Subjective:   History of Present Illness   Review of Systems: Negative, with the exception of above mentioned in HPI.  History:   Reviewed by clinician on day of visit: allergies, medications, problem list, medical history, surgical history, family history, social history, and previous encounter notes.  Medications:   Show/hide medication list[1] Allergies[2]  Objective:   Ht 5' 7 (1.702 m)   Wt 236 lb 3.2 oz (107.1 kg)   LMP 03/19/2023 Comment: irregular periods  BMI 36.99 kg/m   Physical Exam  Results for orders placed or performed in visit on 09/14/24  OPHTHALMOLOGY REPORT-SCANNED   Collection Time: 05/18/24  2:51 PM  Result Value Ref Range   HM Diabetic Eye Exam No Retinopathy No Retinopathy   A Comment      Attestations:   {EW ATTESTATIONS:34266}  Frieze Prentiss, DO, MS, FAAFP, Dipl. [name] at [facility] [address] [place], [address] Dept: [phone] Dept Fax: [phone]     [1]  Outpatient  Medications Prior to Visit  Medication Sig   acetaminophen  (TYLENOL ) 500 MG tablet Take 1,000 mg by mouth every 6 (six) hours as needed for moderate pain or headache.   albuterol  (VENTOLIN  HFA) 108 (90 Base) MCG/ACT inhaler Inhale 1-2 puffs into the lungs every 4 (four) hours as needed for wheezing or shortness of breath.   Atogepant  (QULIPTA ) 60 MG TABS Take 1 tablet by mouth once daily.   B Complex Vitamins (VITAMIN B COMPLEX) TABS 1 tablet Orally once a day   buPROPion  (WELLBUTRIN  XL) 150 MG 24 hr tablet TAKE 1 TABLET BY MOUTH ONCE DAILY.   busPIRone  (BUSPAR ) 15 MG tablet Take 1 tablet (15 mg total) by mouth 2 (two) times daily.   Calcium Carb-Cholecalciferol (CALCIUM 1000 + D) 1000-20 MG-MCG TABS Take 600 mg by mouth.   DULoxetine  (CYMBALTA ) 60 MG capsule Take 1 capsule (60 mg total) by mouth daily.   estradiol (ESTRACE) 0.01 % CREA vaginal cream Place 1 Applicatorful vaginally 2 (two) times a week.   estradiol (VIVELLE-DOT) 0.1 MG/24HR patch Place 1 patch onto the skin 2 (two) times a week.   fexofenadine (ALLEGRA) 180 MG tablet Take 180 mg by mouth daily.   linaclotide  (LINZESS ) 290 MCG CAPS capsule Take 1 capsule by mouth at least 30 minutes before the first meal of the day on an empty stomach   lisdexamfetamine  (VYVANSE ) 60 MG capsule Take 1 capsule (60 mg total) by mouth daily.   LORazepam  (ATIVAN ) 1 MG tablet Take 1 tablet (1 mg total) by mouth daily  as needed for anxiety.   Magnesium  Bisglycinate (MAG GLYCINATE) 100 MG TABS Take 100 mg by mouth.   minoxidil  (LONITEN ) 2.5 MG tablet Take 1 tablet (2.5 mg total) by mouth 2 (two) times daily.   Multiple Vitamins-Minerals (MULTIVITAMIN & MINERAL PO) Take 1 tablet by mouth 2 (two) times daily.    ondansetron  (ZOFRAN -ODT) 8 MG disintegrating tablet Take 8 mg by mouth every 8 (eight) hours as needed.   pravastatin  (PRAVACHOL ) 20 MG tablet Take 1 tablet (20 mg total) by mouth every evening.   progesterone  (PROMETRIUM ) 100 MG capsule TAKE 1  CAPSULE BY MOUTH AT BEDTIME.   promethazine  (PHENERGAN ) 25 MG tablet Take 1 tablet (25 mg total) by mouth 2 (two) times daily as needed for nausea.   spironolactone  (ALDACTONE ) 50 MG tablet Take 1 tablet (50 mg total) by mouth daily.   tirzepatide  (MOUNJARO ) 15 MG/0.5ML Pen Inject 15 mg into the skin once a week.   traZODone  (DESYREL ) 100 MG tablet Take 1 tablet (100 mg total) by mouth at bedtime as needed for sleep   Ubrogepant  (UBRELVY ) 100 MG TABS Take 1 tablet (100 mg total) by mouth as needed for migraine. May repeat after 2 hours if needed, max 2 doses in 24 hours.   UNABLE TO FIND Testosterone cream 4% 2 times weekly to inner thigh   VITAMIN E PO Take 100 Units by mouth daily.   No facility-administered medications prior to visit.  [2]  Allergies Allergen Reactions   Hydrochlorothiazide  Other (See Comments)    Dropped K+ level to critical value.   Morphine  Hives   Aspirin Other (See Comments)    HAS HAD GASTRIC BYPASS   Nsaids Other (See Comments)    HAS HAD GASTRIC BYPASS  Other Reaction(s): history of Gastric bypass    Non-steroidal anti-inflammatory agent (substance)   Tape Rash    PAPER TAPE IS OKAY   Wound Dressing Adhesive Dermatitis    Skin peeling  Paper tape and Tegaderm OK   "

## 2024-11-01 ENCOUNTER — Encounter: Payer: Self-pay | Admitting: Family Medicine

## 2024-11-01 DIAGNOSIS — E119 Type 2 diabetes mellitus without complications: Secondary | ICD-10-CM

## 2024-11-01 DIAGNOSIS — Z006 Encounter for examination for normal comparison and control in clinical research program: Secondary | ICD-10-CM | POA: Insufficient documentation

## 2024-11-01 DIAGNOSIS — F5104 Psychophysiologic insomnia: Secondary | ICD-10-CM | POA: Insufficient documentation

## 2024-11-01 LAB — CBC WITH DIFFERENTIAL/PLATELET
Basophils Absolute: 0.1 K/uL (ref 0.0–0.1)
Basophils Relative: 0.8 % (ref 0.0–3.0)
Eosinophils Absolute: 0.1 K/uL (ref 0.0–0.7)
Eosinophils Relative: 1.3 % (ref 0.0–5.0)
HCT: 38.7 % (ref 36.0–46.0)
Hemoglobin: 13.3 g/dL (ref 12.0–15.0)
Lymphocytes Relative: 15.7 % (ref 12.0–46.0)
Lymphs Abs: 1.1 K/uL (ref 0.7–4.0)
MCHC: 34.4 g/dL (ref 30.0–36.0)
MCV: 88.2 fl (ref 78.0–100.0)
Monocytes Absolute: 0.6 K/uL (ref 0.1–1.0)
Monocytes Relative: 8.6 % (ref 3.0–12.0)
Neutro Abs: 5.1 K/uL (ref 1.4–7.7)
Neutrophils Relative %: 73.6 % (ref 43.0–77.0)
Platelets: 414 K/uL — ABNORMAL HIGH (ref 150.0–400.0)
RBC: 4.39 Mil/uL (ref 3.87–5.11)
RDW: 12.9 % (ref 11.5–15.5)
WBC: 6.9 K/uL (ref 4.0–10.5)

## 2024-11-01 LAB — COMPREHENSIVE METABOLIC PANEL WITH GFR
ALT: 26 U/L (ref 3–35)
AST: 30 U/L (ref 5–37)
Albumin: 4 g/dL (ref 3.5–5.2)
Alkaline Phosphatase: 57 U/L (ref 39–117)
BUN: 14 mg/dL (ref 6–23)
CO2: 27 meq/L (ref 19–32)
Calcium: 9.1 mg/dL (ref 8.4–10.5)
Chloride: 105 meq/L (ref 96–112)
Creatinine, Ser: 1.02 mg/dL (ref 0.40–1.20)
GFR: 67.07 mL/min
Glucose, Bld: 69 mg/dL — ABNORMAL LOW (ref 70–99)
Potassium: 3.8 meq/L (ref 3.5–5.1)
Sodium: 140 meq/L (ref 135–145)
Total Bilirubin: 0.5 mg/dL (ref 0.2–1.2)
Total Protein: 6.8 g/dL (ref 6.0–8.3)

## 2024-11-01 LAB — LIPID PANEL
Cholesterol: 146 mg/dL (ref 28–200)
HDL: 63 mg/dL
LDL Cholesterol: 72 mg/dL (ref 10–99)
NonHDL: 83.09
Total CHOL/HDL Ratio: 2
Triglycerides: 57 mg/dL (ref 10.0–149.0)
VLDL: 11.4 mg/dL (ref 0.0–40.0)

## 2024-11-01 LAB — MICROALBUMIN / CREATININE URINE RATIO
Creatinine,U: 205.3 mg/dL
Microalb Creat Ratio: UNDETERMINED mg/g (ref 0.0–30.0)
Microalb, Ur: <0.7 mg/dL

## 2024-11-01 LAB — TSH: TSH: 1.52 u[IU]/mL (ref 0.35–5.50)

## 2024-11-01 LAB — HEMOGLOBIN A1C: Hgb A1c MFr Bld: 4.6 % (ref 4.6–6.5)

## 2024-11-01 LAB — GENECONNECT MOLECULAR SCREEN

## 2024-11-01 MED ORDER — PREDNISONE 10 MG PO TABS: ORAL_TABLET | ORAL | 0 refills | Status: AC

## 2024-11-01 MED ORDER — CEPHALEXIN 500 MG PO CAPS: 500.0000 mg | ORAL_CAPSULE | Freq: Two times a day (BID) | ORAL | 0 refills | Status: AC

## 2024-11-01 NOTE — Progress Notes (Signed)
 "    Patient Care Team: Prentiss Frieze, DO as PCP - General (Family Medicine) Monetta Redell PARAS, MD as PCP - Cardiology (Cardiology) Harl Jayson CROME, MD (Inactive) (Family Medicine)  Weight Management:     Starting weight: 345 lbs Starting date: 04/30/2021 Today's weight: 236.2lb Today's date: 11/01/2024 Total lbs lost to date: 109 lbs Total lbs lost since last in-office visit: 6 Total weight loss percentage to date: -31.59% Body mass index is 36.99 kg/m.   Diagnoses and Orders:   1. Type 2 diabetes mellitus without complication, without long-term current use of insulin  (HCC)   2. Research study patient   3. 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 Perimenopausal symptoms   5. Hidradenitis suppurativa   6. Psychophysiological insomnia   7. Attention deficit hyperactivity disorder, predominantly inattentive type   8. Migraine without aura and without status migrainosus, not intractable   9. Class 2 severe obesity with serious comorbidity and body mass index (BMI) of 36.0 to 36.9 in adult, unspecified obesity type    Meds ordered this encounter  Medications   traZODone  (DESYREL ) 100 MG tablet    Sig: Take 1 tablet (100 mg total) by mouth at bedtime.    Dispense:  30 tablet    Refill:  3   lisdexamfetamine  (VYVANSE ) 60 MG capsule    Sig: Take 1 capsule (60 mg total) by mouth daily.    Dispense:  30 capsule    Refill:  0   Ubrogepant  (UBRELVY ) 100 MG TABS    Sig: Take 1 tablet (100 mg total) by mouth as needed for migraine. May repeat after 2 hours if needed, max 2 doses in 24 hours.    Dispense:  16 tablet    Refill:  0   cephALEXin  (KEFLEX ) 500 MG capsule    Sig: Take 1 capsule (500 mg total) by mouth 2 (two) times daily for 10 days.    Dispense:  20 capsule    Refill:  0   predniSONE  (DELTASONE ) 10 MG tablet    Sig: 6-5-4-3-2-1-OFF    Dispense:  21 tablet    Refill:  0   Orders Placed This Encounter  Procedures   [facility] Molecular Screen -  Blood   CBC with Differential/Platelet   Comprehensive metabolic panel with GFR   Hemoglobin A1c   Lipid panel   Microalbumin / creatinine urine ratio   TSH   Assessment & Plan:   Assessment and Plan Assessment & Plan Class 2 severe obesity, status post bariatric surgery BMI decreased to 36.9, visceral fat under 13. Weight loss post-hysterectomy and oophorectomy. - Continue current weight management plan. - Encouraged monitoring of weight and BMI.  Chronic low back pain, status post nerve ablation and S1 injections Improved pain management post L2-L5 nerve ablation and S1 injections. Muscle soreness present, pain manageable. - Encouraged use of heating pads and relaxation techniques.  Hidradenitis suppurativa Flares managed with cephalexin  and prednisone . Last flare in September responded well. - Refilled cephalexin  500 mg BID for 10 days. - Refilled prednisone  tapering dose as needed. - Consider biologic treatment if flares persist.  Migraine without aura Increased frequency, likely stress-induced. Managed with Ubrelvy  and Ativan . - Continue Ubrelvy  as needed. - Continue Ativan  as needed for stress.  Generalized anxiety disorder Increased stress from personal and work issues. - Continue Ativan  as needed. - Encouraged stress management techniques and lifestyle modifications.  Hair loss Minoxidil  increased to twice daily six weeks ago. No significant new growth, maintaining  current hair. - Continue minoxidil  twice daily.  General health maintenance Discussed genetic testing and lab work for family history of health issues. - Ordered genetic testing and lab work.  [name], DO, MS, FAAFP, Dipl. [name] [address] [place], [address] Dept: [phone] Dept Fax: [phone]  Subjective:   History of Present Illness Mindy Reed is a 44 year old female who presents for follow-up regarding weight  management and pain management.  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 - Utilizes heating pads and relaxation techniques for muscle tension  Hidradenitis suppurativa - Previously treated with cephalexin  500 mg twice daily and a tapering prednisone  course - Good disease control since September - Tracking flares for insurance purposes in case biologic therapy is needed  Migraine headaches - Migraines are stress-related - Increased use of Ubrelvy  and Ativan  due to heightened stress from her mother's health  Abdominal pain and gastrointestinal symptoms - Intermittent sharp abdominal pain in the context of prior gastric bypass 10 years ago - Manages symptoms with Protonix  and Linzess   Hormonal therapy - Current medications include Vyvanse , trazodone , spironolactone , and minoxidil  - Discontinued testosterone cream - Feels progesterone  alone is adequate  Review of Systems: Negative, with the exception of above mentioned in HPI.  History:   Reviewed by clinician on day of visit: allergies, medications, problem list, medical history, surgical history, family history, social history, and previous encounter notes.  Medications:   Show/hide medication list[1] Allergies[2]  Objective:   Ht 5' 7 (1.702 m)   Wt 236 lb 3.2 oz (107.1 kg)   LMP 03/19/2023 Comment: irregular periods  BMI 36.99 kg/m   Physical  Exam Constitutional:      General: She is not in acute distress.    Appearance: She is well-developed.  HENT:     Head: Normocephalic and atraumatic.  Eyes:     Conjunctiva/sclera: Conjunctivae normal.  Cardiovascular:     Rate and Rhythm: Normal rate and regular rhythm.     Heart sounds: Normal heart sounds.  Pulmonary:     Effort: Pulmonary effort is normal.     Breath sounds: Normal breath sounds.  Neurological:     General: No focal deficit present.     Mental Status: She is alert.  Psychiatric:        Behavior: Behavior normal.    Results for orders placed or performed in visit on 09/14/24  OPHTHALMOLOGY REPORT-SCANNED   Collection Time: 05/18/24  2:51 PM  Result Value Ref Range   HM Diabetic Eye Exam No Retinopathy No Retinopathy   A Comment      Attestations:   Reviewed by clinician on day of visit: allergies, medications, problem list, medical history, surgical history, family history, social history, and previous encounter notes. Discussed the use of AI scribe software for clinical note transcription with the patient, who gave verbal consent to proceed. As the patient's primary care physician, board-certified in Family Medicine and Obesity Medicine, I am providing ongoing, comprehensive obesity  care based on the pillars of obesity medicine, including nutrition therapy, physical activity, behavioral modification, and pharmacologic treatment.  I personally spent a total of 46 minutes in the care of the patient today including preparing to see the patient, performing a medically appropriate exam/evaluation, counseling and educating, placing orders, and documenting clinical information in the EHR.  [name], DO, MS, FAAFP, Dipl. [name] [address] [place], [address] Dept: [phone] Dept Fax: [phone]      [1]  Outpatient Medications Prior to Visit  Medication Sig   acetaminophen  (TYLENOL ) 500 MG  tablet Take 1,000 mg by mouth every 6 (six) hours as needed for moderate pain or headache.   albuterol  (VENTOLIN  HFA) 108 (90 Base) MCG/ACT inhaler Inhale 1-2 puffs into the lungs every 4 (four) hours as needed for wheezing or shortness of breath.   Atogepant  (QULIPTA ) 60 MG TABS Take 1 tablet by mouth once daily.   B Complex Vitamins (VITAMIN B COMPLEX) TABS 1 tablet Orally once a day   buPROPion  (WELLBUTRIN  XL) 150 MG 24 hr tablet TAKE 1 TABLET BY MOUTH ONCE DAILY.   busPIRone  (BUSPAR ) 15 MG tablet Take 1 tablet (15 mg total) by mouth 2 (two) times daily.   Calcium Carb-Cholecalciferol (CALCIUM 1000 + D) 1000-20 MG-MCG TABS Take 600 mg by mouth.   DULoxetine  (CYMBALTA ) 60 MG capsule Take 1 capsule (60 mg total) by mouth daily.   estradiol (ESTRACE) 0.01 % CREA vaginal cream Place 1 Applicatorful vaginally 2 (two) times a week.   estradiol (VIVELLE-DOT) 0.1 MG/24HR patch Place 1 patch onto the skin 2 (two) times a week.   fexofenadine (ALLEGRA) 180 MG tablet Take 180 mg by mouth daily.   linaclotide  (LINZESS ) 290 MCG CAPS capsule Take 1 capsule by mouth at least 30 minutes before the first meal of the day on an empty stomach   LORazepam  (ATIVAN ) 1 MG tablet Take 1 tablet (1 mg total) by mouth daily as needed for anxiety.   Magnesium  Bisglycinate (MAG GLYCINATE) 100 MG TABS Take 100 mg by mouth.   minoxidil  (LONITEN ) 2.5 MG tablet Take 1 tablet (2.5 mg total) by mouth 2 (two) times daily.   Multiple Vitamins-Minerals (MULTIVITAMIN & MINERAL PO) Take 1 tablet by mouth 2 (two) times daily.    ondansetron  (ZOFRAN -ODT) 8 MG disintegrating tablet Take 8 mg by mouth every 8 (eight) hours as needed.   pravastatin  (PRAVACHOL ) 20 MG tablet Take 1 tablet (20 mg total) by mouth every evening.   progesterone  (PROMETRIUM ) 100 MG capsule TAKE 1 CAPSULE BY MOUTH AT BEDTIME.   promethazine  (PHENERGAN ) 25 MG tablet Take 1 tablet (25 mg total) by mouth 2 (two) times daily as needed for nausea.   spironolactone   (ALDACTONE ) 50 MG tablet Take 1 tablet (50 mg total) by mouth daily.   tirzepatide  (MOUNJARO ) 15 MG/0.5ML Pen Inject 15 mg into the skin once a week.   UNABLE TO FIND Testosterone cream 4% 2 times weekly to inner thigh   VITAMIN E PO Take 100 Units by mouth daily.   [DISCONTINUED] lisdexamfetamine  (VYVANSE ) 60 MG capsule Take 1 capsule (60 mg total) by mouth daily.   [DISCONTINUED] traZODone  (DESYREL ) 100 MG tablet Take 1 tablet (100 mg total) by mouth at bedtime as needed for sleep   [DISCONTINUED] Ubrogepant  (UBRELVY ) 100 MG TABS Take 1 tablet (100 mg total) by mouth as needed for migraine. May repeat after 2 hours if needed, max 2 doses in 24 hours.  No facility-administered medications prior to visit.  [2]  Allergies Allergen Reactions   Hydrochlorothiazide  Other (See Comments)    Dropped K+ level to critical value.   Morphine  Hives   Aspirin Other (See Comments)    HAS HAD GASTRIC BYPASS   Nsaids Other (See Comments)    HAS HAD GASTRIC BYPASS  Other Reaction(s): history of Gastric bypass    Non-steroidal anti-inflammatory agent (substance)   Tape Rash    PAPER TAPE IS OKAY   Wound Dressing Adhesive Dermatitis    Skin peeling  Paper tape and Tegaderm OK   "

## 2024-11-03 ENCOUNTER — Other Ambulatory Visit (HOSPITAL_COMMUNITY): Payer: Self-pay

## 2024-11-03 DIAGNOSIS — B379 Candidiasis, unspecified: Secondary | ICD-10-CM

## 2024-11-03 MED ORDER — TIRZEPATIDE 10 MG/0.5ML ~~LOC~~ SOAJ
10.0000 mg | SUBCUTANEOUS | 1 refills | Status: AC
  Filled 2024-11-03 – 2024-11-04 (×2): qty 2, 28d supply, fill #0

## 2024-11-04 ENCOUNTER — Encounter (HOSPITAL_COMMUNITY): Payer: Self-pay

## 2024-11-04 ENCOUNTER — Other Ambulatory Visit (HOSPITAL_COMMUNITY): Payer: Self-pay

## 2024-11-04 MED ORDER — FLUCONAZOLE 150 MG PO TABS: ORAL_TABLET | ORAL | 0 refills | Status: AC

## 2024-11-04 MED ORDER — MOUNJARO 12.5 MG/0.5ML ~~LOC~~ SOAJ
12.5000 mg | SUBCUTANEOUS | 0 refills | Status: AC
  Filled 2024-11-04 – 2024-11-06 (×2): qty 2, 28d supply, fill #0

## 2024-11-04 NOTE — Telephone Encounter (Signed)
 I have spoken with pt and notified her of the treatment. Pharmacy confirmed. Medication sent

## 2024-11-06 ENCOUNTER — Other Ambulatory Visit (HOSPITAL_COMMUNITY): Payer: Self-pay

## 2024-11-07 ENCOUNTER — Other Ambulatory Visit (HOSPITAL_COMMUNITY): Payer: Self-pay

## 2024-11-07 ENCOUNTER — Other Ambulatory Visit: Payer: Self-pay

## 2024-11-08 ENCOUNTER — Other Ambulatory Visit (HOSPITAL_COMMUNITY): Payer: Self-pay

## 2024-11-09 ENCOUNTER — Other Ambulatory Visit (HOSPITAL_COMMUNITY): Payer: Self-pay

## 2024-11-09 NOTE — Addendum Note (Signed)
 Addended by: DELPHINE BRUNO HERO on: 11/09/2024 11:14 AM   Modules accepted: Orders

## 2024-11-10 ENCOUNTER — Other Ambulatory Visit (HOSPITAL_COMMUNITY): Payer: Self-pay

## 2024-11-12 ENCOUNTER — Other Ambulatory Visit (HOSPITAL_COMMUNITY): Payer: Self-pay

## 2024-11-14 DIAGNOSIS — F32A Depression, unspecified: Secondary | ICD-10-CM

## 2024-11-15 MED ORDER — BUPROPION HCL ER (XL) 150 MG PO TB24: ORAL_TABLET | ORAL | 1 refills | Status: AC

## 2024-11-20 LAB — GENECONNECT MOLECULAR SCREEN: Genetic Analysis Overall Interpretation: NEGATIVE

## 2024-11-21 ENCOUNTER — Other Ambulatory Visit

## 2024-12-22 ENCOUNTER — Ambulatory Visit: Admitting: Family Medicine

## 2025-03-30 ENCOUNTER — Ambulatory Visit: Admitting: Dermatology
